# Patient Record
Sex: Male | Born: 1995 | Race: White | Hispanic: No | Marital: Single | Smoking: Never smoker
Health system: Southern US, Community
[De-identification: ages and names within clinical notes are randomized; demographics above are authoritative.]

## PROBLEM LIST (undated history)

## (undated) DIAGNOSIS — R110 Nausea: Secondary | ICD-10-CM

## (undated) DIAGNOSIS — R51 Headache: Secondary | ICD-10-CM

## (undated) DIAGNOSIS — F32A Depression, unspecified: Secondary | ICD-10-CM

## (undated) DIAGNOSIS — E300 Delayed puberty: Secondary | ICD-10-CM

## (undated) DIAGNOSIS — E039 Hypothyroidism, unspecified: Secondary | ICD-10-CM

## (undated) DIAGNOSIS — R7303 Prediabetes: Secondary | ICD-10-CM

## (undated) DIAGNOSIS — F909 Attention-deficit hyperactivity disorder, unspecified type: Secondary | ICD-10-CM

## (undated) DIAGNOSIS — F329 Major depressive disorder, single episode, unspecified: Secondary | ICD-10-CM

## (undated) DIAGNOSIS — F419 Anxiety disorder, unspecified: Secondary | ICD-10-CM

## (undated) HISTORY — DX: Nausea: R11.0

## (undated) HISTORY — DX: Attention-deficit hyperactivity disorder, unspecified type: F90.9

## (undated) HISTORY — DX: Hypothyroidism, unspecified: E03.9

## (undated) HISTORY — DX: Prediabetes: R73.03

## (undated) HISTORY — PX: PLANTAR'S WART EXCISION: SHX2240

---
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
## 2016-01-05 ENCOUNTER — Emergency Department (HOSPITAL_COMMUNITY)
Admission: EM | Admit: 2016-01-05 | Discharge: 2016-01-05 | Disposition: A | Discharge status: Home / Self Care | Payer: Medicaid Other | Attending: Emergency Medicine | Admitting: Emergency Medicine

## 2016-01-05 ENCOUNTER — Encounter (HOSPITAL_COMMUNITY): Payer: Self-pay | Admitting: Emergency Medicine

## 2016-01-05 DIAGNOSIS — R6883 Chills (without fever): Secondary | ICD-10-CM | POA: Insufficient documentation

## 2016-01-05 DIAGNOSIS — F329 Major depressive disorder, single episode, unspecified: Secondary | ICD-10-CM | POA: Insufficient documentation

## 2016-01-05 DIAGNOSIS — R112 Nausea with vomiting, unspecified: Secondary | ICD-10-CM | POA: Diagnosis not present

## 2016-01-05 DIAGNOSIS — Z79899 Other long term (current) drug therapy: Secondary | ICD-10-CM | POA: Insufficient documentation

## 2016-01-05 DIAGNOSIS — R1031 Right lower quadrant pain: Secondary | ICD-10-CM | POA: Insufficient documentation

## 2016-01-05 DIAGNOSIS — F419 Anxiety disorder, unspecified: Secondary | ICD-10-CM | POA: Diagnosis not present

## 2016-01-05 LAB — COMPREHENSIVE METABOLIC PANEL
ALBUMIN: 4.5 g/dL (ref 3.5–5.0)
ALK PHOS: 117 U/L (ref 38–126)
ALT: 17 U/L (ref 17–63)
ANION GAP: 11 (ref 5–15)
AST: 22 U/L (ref 15–41)
BILIRUBIN TOTAL: 0.6 mg/dL (ref 0.3–1.2)
BUN: 18 mg/dL (ref 6–20)
CALCIUM: 9.7 mg/dL (ref 8.9–10.3)
CO2: 25 mmol/L (ref 22–32)
Chloride: 104 mmol/L (ref 101–111)
Creatinine, Ser: 0.77 mg/dL (ref 0.61–1.24)
GFR calc Af Amer: >60 mL/min (ref >60)
GFR calc non Af Amer: >60 mL/min (ref >60)
GLUCOSE: 105 mg/dL — AB (ref 65–99)
Potassium: 4 mmol/L (ref 3.5–5.1)
SODIUM: 140 mmol/L (ref 135–145)
TOTAL PROTEIN: 7.6 g/dL (ref 6.5–8.1)

## 2016-01-05 LAB — URINALYSIS, ROUTINE W REFLEX MICROSCOPIC
Glucose, UA: NEGATIVE mg/dL
HGB URINE DIPSTICK: NEGATIVE
KETONES UR: NEGATIVE mg/dL
Leukocytes, UA: NEGATIVE
Nitrite: NEGATIVE
PH: 6.5 (ref 5.0–8.0)
Protein, ur: NEGATIVE mg/dL
SPECIFIC GRAVITY, URINE: 1.036 — AB (ref 1.005–1.030)

## 2016-01-05 LAB — CBC
HCT: 41.8 % (ref 39.0–52.0)
HEMOGLOBIN: 14.3 g/dL (ref 13.0–17.0)
MCH: 29.4 pg (ref 26.0–34.0)
MCHC: 34.2 g/dL (ref 30.0–36.0)
MCV: 85.8 fL (ref 78.0–100.0)
Platelets: 262 10*3/uL (ref 150–400)
RBC: 4.87 MIL/uL (ref 4.22–5.81)
RDW: 12.7 % (ref 11.5–15.5)
WBC: 11.2 10*3/uL — ABNORMAL HIGH (ref 4.0–10.5)

## 2016-01-05 LAB — LIPASE, BLOOD: Lipase: 21 U/L (ref 11–51)

## 2016-01-05 MED ORDER — METOCLOPRAMIDE HCL 5 MG/ML IJ SOLN
10.0000 mg | Freq: Once | INTRAMUSCULAR | Status: AC
  Administered 2016-01-05: 10 mg via INTRAVENOUS
  Filled 2016-01-05: qty 2

## 2016-01-05 MED ORDER — LORAZEPAM 2 MG/ML IJ SOLN
1.0000 mg | Freq: Once | INTRAMUSCULAR | Status: AC
  Administered 2016-01-05: 1 mg via INTRAVENOUS
  Filled 2016-01-05: qty 1

## 2016-01-05 MED ORDER — ONDANSETRON HCL 4 MG/2ML IJ SOLN
4.0000 mg | Freq: Once | INTRAMUSCULAR | Status: AC
  Administered 2016-01-05: 4 mg via INTRAVENOUS
  Filled 2016-01-05: qty 2

## 2016-01-05 MED ORDER — DIPHENHYDRAMINE HCL 50 MG/ML IJ SOLN: 25.0000 mg | Freq: Once | INTRAMUSCULAR | Status: DC

## 2016-01-05 MED ORDER — SODIUM CHLORIDE 0.9 % IV BOLUS (SEPSIS)
1000.0000 mL | Freq: Once | INTRAVENOUS | Status: AC
  Administered 2016-01-05: 1000 mL via INTRAVENOUS

## 2016-01-05 MED ORDER — SUCRALFATE 1 GM/10ML PO SUSP
1.0000 g | Freq: Three times a day (TID) | ORAL | Status: DC
  Filled 2016-01-05 (×5): qty 10

## 2016-01-05 MED ORDER — LORAZEPAM 0.5 MG PO TABS: 0.5000 mg | ORAL_TABLET | Freq: Three times a day (TID) | ORAL | Status: DC | PRN

## 2016-01-05 NOTE — ED Notes (Signed)
Pt aware that a urine sample is needed. Pt sts he hasn't been able to urinate for the past 4 hours.  But does have a urinal at bedside and will let staff know when he is finished.

## 2016-01-05 NOTE — ED Notes (Signed)
Patient presents with RLQ abdominal pain, N/V starting yesterday evening. Denies fever/chills, diarrhea.

## 2016-01-05 NOTE — Discharge Instructions (Signed)
As discussed, today's evaluation is largely reassuring.  However, with his episodes of recurrent nausea, vomiting, it is important that he follow up with our gastroenterology colleagues.  Return here for concerning changes in his condition.

## 2016-01-05 NOTE — ED Provider Notes (Signed)
CSN: [id number]     Arrival date & time 01/05/16  0024 History  By signing my name below, I, Charles Beard, attest that this documentation has been prepared under the direction and in the presence of Charles Munch, MD. Electronically Signed: Tanda Beard, ED Scribe. 01/05/2016. 1:07 AM.   No chief complaint on file.  The history is provided by the patient. No language interpreter was used.     HPI Comments: Charles Beard is a 20 y.o. male brought in by mother, with PMHx ADHD, anxiety, and depression who presents to the Emergency Department complaining of gradual onset, constant, improving, RLQ abdominal pain x 24 hours. Pt also complains of nausea, vomiting, and chills. Mom mentions that pt has had episodes of vomiting with diarrhea about once every month for a couple of months. He does not usually have abdominal pain with these episodes. Pt has been seen by his PCP for these symptoms with no definitive findings. Denies fever, diarrhea, or any other associated symptoms. No previous abdominal surgeries.   Patient has history of delayed puberty, ADHD  Past Medical History  Diagnosis Date  . ADHD (attention deficit hyperactivity disorder) 2000  . Anxiety   . Headache(784.0)   . Depression    Past Surgical History  Procedure Laterality Date  . Plantar's wart excision     Family History  Problem Relation Age of Onset  . Thyroid disease Mother    Social History  Substance Use Topics  . Smoking status: Never Smoker   . Smokeless tobacco: None  . Alcohol Use: No    Review of Systems  Constitutional: Positive for chills. Negative for fever.       Per HPI, otherwise negative  HENT:       Per HPI, otherwise negative  Respiratory:       Per HPI, otherwise negative  Cardiovascular:       Per HPI, otherwise negative  Gastrointestinal: Positive for nausea, vomiting and abdominal pain. Negative for diarrhea.  Endocrine:       Negative aside from HPI  Genitourinary:       Neg aside  from HPI   Musculoskeletal:       Per HPI, otherwise negative  Skin: Negative.   Neurological: Negative for syncope.  Psychiatric/Behavioral: The patient is nervous/anxious.    Allergies  Review of patient's allergies indicates no known allergies.  Home Medications   Prior to Admission medications   Medication Sig Start Date End Date Taking? Authorizing Provider  calcium carbonate (OS-CAL) 600 MG TABS tablet Take 600 mg by mouth 2 (two) times daily with a meal.    Historical Provider, MD  carbamazepine (TEGRETOL XR) 200 MG 12 hr tablet Take 200 mg by mouth 2 (two) times daily.    Historical Provider, MD  escitalopram (LEXAPRO) 10 MG tablet Take 1 tablet (10 mg total) by mouth daily. 06/21/13   [name], NP  guanFACINE (INTUNIV) 1 MG TB24 Take 1 mg by mouth once.     Historical Provider, MD  lansoprazole (PREVACID) 30 MG capsule Take 1 capsule (30 mg total) by mouth daily at 12 noon. Take one capsule by mouth each morning. Patient not taking: Reported on 11/18/2014 11/20/13 11/20/14  [name], MD  Melatonin 3 MG CAPS Take 10 mg by mouth at bedtime. Patient may resume home supply. 06/21/13   [name], NP  methylphenidate 54 MG PO CR tablet Take 54 mg by mouth every morning.    Historical Provider, MD  Multiple Vitamin (MULTIVITAMIN) tablet Take 1 tablet by mouth daily. Patient may resume home supply. 06/21/13   Jolene SchimkeKim B Winson, NP  Olopatadine HCl (PAZEO) 0.7 % SOLN Place 1 drop into the right eye at bedtime.    Historical Provider, MD  Omega-3 Fatty Acids (FISH OIL) 500 MG CAPS Take 500 mg by mouth daily. Patient may resume home supply. 06/21/13   Jolene SchimkeKim B Winson, NP  omeprazole (PRILOSEC) 40 MG capsule Take 1 capsule (40 mg total) by mouth daily. Patient not taking: Reported on 11/18/2014 09/23/13 09/23/14  David StallMichael J Brennan, MD  promethazine (PHENERGAN) 25 MG tablet Take 1 tablet (25 mg total) by mouth every 6 (six) hours as needed for nausea or vomiting. Patient not taking: Reported  on 11/25/2015 09/10/15   [name], [name]  ranitidine (ZANTAC) 150 MG tablet Take 1 tablet (150 mg total) by mouth 2 (two) times daily. Patient not taking: Reported on 11/25/2015 09/10/15   [name], [name]  SYNTHROID 25 MCG tablet TAKE 1 TABLET BY MOUTH EVERY DAY BEFORE BREAKFAST 10/08/15   David StallMichael J Brennan, MD   BP 132/95 mmHg  Pulse 75  Temp(Src) 98.5 F (36.9 C) (Oral)  Resp 18  SpO2 100%   Physical Exam  Constitutional: He is oriented to person, place, and time. He appears well-developed. No distress.  HENT:  Head: Normocephalic and atraumatic.  Eyes: Conjunctivae and EOM are normal.  Cardiovascular: Normal rate and regular rhythm.   Pulmonary/Chest: Effort normal. No stridor. No respiratory distress.  Abdominal: He exhibits no distension. There is no tenderness. There is no rebound.  Musculoskeletal: He exhibits no edema.  Neurological: He is alert and oriented to person, place, and time.  Skin: Skin is warm and dry.  Psychiatric: His mood appears anxious.  Nursing note and vitals reviewed.   ED Course  Procedures (including critical care time)  DIAGNOSTIC STUDIES: Oxygen Saturation is 100% on RA, normal by my interpretation.    COORDINATION OF CARE: 1:06 AM-Discussed treatment plan with pt at bedside and pt agreed to plan.   2:53 AM - Upon reevaluation, pt states he is no longer having abdominal pain but is still vomiting. Mom reports that pt has been "jerking" after taking the Reglan.   Labs Review Labs Reviewed  COMPREHENSIVE METABOLIC PANEL - Abnormal; Notable for the following:    Glucose, Bld 105 (*)    All other components within normal limits  CBC - Abnormal; Notable for the following:    WBC 11.2 (*)    All other components within normal limits  URINALYSIS, ROUTINE W REFLEX MICROSCOPIC (NOT AT [facility]) - Abnormal; Notable for the following:    Color, Urine AMBER (*)    Specific Gravity, Urine 1.036 (*)    Bilirubin Urine SMALL (*)     All other components within normal limits  LIPASE, BLOOD    I have personally reviewed and evaluated these lab results as part of my medical decision-making.  2:57 AM After the patient had minimal change in his initial nausea with Zofran, he received a dose of Reglan.  On repeat exam, the patient has akathisia. Benadryl provided. He denies any ongoing abdominal pain.   3:32 AM Patient's akesthesia resolved.  He continues to have mild emesis, c/o heartburn.  4:47 AM Patient sleeping, comfortable. I had a lengthy conversation with the patient's mother about his history, congenital issues, episodic nausea, vomiting. Patient will follow-up with primary care and gastroenterology. MDM   I personally performed the services described in this  documentation, which was scribed in my presence. The recorded information has been reviewed and is accurate.   Young male presents with nausea, vomiting. Patient did complain of abdominal pain, but here, the patient has a soft, non-peritoneal abdomen, states that his pain has resolved. Nausea also improved, though the patient had a brief episode of akathisia after being provided Reglan, as a second intervention for his nausea. Given the patient's resolution of symptoms, reassuring vitals, and mother's description of similar episodes mother is low suspicion for acute intra-abdominal processes. Patient discharged in stable condition with provision of Ativan, per mother's request, to follow-up with gastroenterology.   Charles Munch, MD 01/05/16 [phone]

## 2016-02-19 ENCOUNTER — Ambulatory Visit
Admission: RE | Admit: 2016-02-19 | Discharge: 2016-02-19 | Disposition: A | Discharge status: Home / Self Care | Payer: Medicaid Other | Source: Ambulatory Visit | Attending: "Endocrinology | Admitting: "Endocrinology

## 2016-02-24 ENCOUNTER — Ambulatory Visit (INDEPENDENT_AMBULATORY_CARE_PROVIDER_SITE_OTHER): Payer: Medicaid Other | Admitting: "Endocrinology

## 2016-02-24 ENCOUNTER — Encounter: Payer: Self-pay | Admitting: *Deleted

## 2016-02-24 ENCOUNTER — Encounter: Payer: Self-pay | Admitting: "Endocrinology

## 2016-02-24 VITALS — BP 119/84 | HR 54 | Wt 152.4 lb

## 2016-02-24 DIAGNOSIS — R6252 Short stature (child): Secondary | ICD-10-CM

## 2016-02-24 DIAGNOSIS — E300 Delayed puberty: Secondary | ICD-10-CM

## 2016-02-24 DIAGNOSIS — I10 Essential (primary) hypertension: Secondary | ICD-10-CM

## 2016-02-24 DIAGNOSIS — R7303 Prediabetes: Secondary | ICD-10-CM

## 2016-02-24 DIAGNOSIS — E663 Overweight: Secondary | ICD-10-CM

## 2016-02-24 DIAGNOSIS — E049 Nontoxic goiter, unspecified: Secondary | ICD-10-CM

## 2016-02-24 DIAGNOSIS — E063 Autoimmune thyroiditis: Secondary | ICD-10-CM

## 2016-02-24 DIAGNOSIS — L83 Acanthosis nigricans: Secondary | ICD-10-CM

## 2016-02-24 DIAGNOSIS — E038 Other specified hypothyroidism: Secondary | ICD-10-CM

## 2016-02-24 DIAGNOSIS — E669 Obesity, unspecified: Secondary | ICD-10-CM | POA: Diagnosis not present

## 2016-02-24 LAB — GLUCOSE, POCT (MANUAL RESULT ENTRY): POC GLUCOSE: 77 mg/dL (ref 70–99)

## 2016-02-24 LAB — POCT GLYCOSYLATED HEMOGLOBIN (HGB A1C): HEMOGLOBIN A1C: 5.7

## 2016-02-24 NOTE — Patient Instructions (Signed)
Follow up visit in 3 months. Please repeat lab tests 1-2 weeks prior.  

## 2016-02-24 NOTE — Progress Notes (Addendum)
Subjective:  Patient Name: Charles Beard Date of Birth: 1996/08/05  MRN: [id number]  Charles Beard  presents to the office today for follow up evaluation and management of his puberty delay, obesity, gynecomastia, and linear growth delay/short stature.   HISTORY OF PRESENT ILLNESS:   Charles Beard is a 20 y.o. Caucasian young man.   Charles Beard was accompanied by his mother.  1. Charles Beard's initial [facility] pediatric endocrine consultation was on 05/22/13 at age 20  A. [name]: Term, emergency C-section for failure to progress.  Birth weight: 7 lbs-15 oz. Healthy newborn.  B. Infancy: Healthy  C. Childhood: ADHD was diagnosed about age 89. Charles Beard was [facility] and [facility]. Stimulants and non-stimulants did not work.  He also had severe anger issues. He sometimes bit himself or hit things, but had not hurt himself seriously. He was hit in the forehead by a pitched baseball at about age 19-8. There was no loss of consciousness. His only surgery was curettage of plantar warts. He had seasonal allergies, but no medication allergies.    D. Obesity: Mom had been concerned about his weight gain for several years. Mom tried to get him to eat healthy. According to mom, dad fed him whatever he wanted. Neither dad nor Charles Beard exercised.   E. Puberty delay: He had no axillary hair, pubic hair, or genital development.  F. Pertinent family history: Strong FH of obesity in mom and  maternal relatives. FH of T2DM in maternal grandmother. No FH of delayed puberty. Hypothyroidism in mom, MGM, MGGM. Neither mom nor MGM had thyroid surgery or irradiation. Menopause at age 19 in mom, MGM, and MGGM. Mental health issues on dad's side of family. Dad had severe mood swings. Dad was an alcoholic and PGF was an alcoholic. [Addendum 11/18/14: Dad also used drugs. Mom and dad divorced when Charles Beard was age 72. Mom also had depression.]  G. On exam, his BP was elevated at 122/87. He was quite agitated during the visit,  supposedly because he was angry that mom was sharing his personal health information with yet another person. He was significantly overweight. He had a 19-20 gram goiter. His breasts were fatty with a Tanner stage 3 appearance and enlarged areolae, but no breast buds. His abdomen was enlarged. His pubic hair was Tanner stage 1. His testes were 2-3 mL in volume, very early pubertal. Lab tests showed normal CMP,TFTs, and TPO antibody levels. His FSH was 2.2, LH 0.3,  testosterone 63, and estradiol 15.4, all c/w being in early puberty.  H. I diagnosed pubertal delay, linear growth delay, early gynecomastia, overweight, dyspepsia, goiter, and acanthosis. I instructed Charles Beard and his mother on our Eat Right Diet and proper exercise techniques. I also started him on ranitidine, 150 mg, twice daily.   2. During the past three years Charles Beard has had slow progression of puberty. As he has grown older and has had adjustments in his psychiatric medications, he has also become more emotionally stable and less emotionally reactive. 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  3. Charles Beard's last [facility] visit was on 11/25/15.   A. In the interim he has been healthy, but he has had at least one visit to the ED for vomiting and diarrhea.  He is hungry all the time.  He has been walking and riding his bike every day for about 45 minutes. Mom has been watching his portion sizes. He has not had many problems with  pollen allergies, so only takes loratadine as needed.   B. He takes Tegretol, Tenex, Lexapro, and Concerta. He remains on Synthroid, 25 mcg/day.  Charles Beard continues to see his psychiatrist, Charles Beard. His anxiety and depression have been much better since starting Lexapro and changing to Tenex.    D. He had psychological testing in the past. Charles Beard was found to have "a little bit of a low IQ". ADD was also noted. An individual IEP was developed. Last year he was in some  classes with only 4 students per class.  3.  Pertinent Review of Systems:  Constitutional: He feels "good".  He usually gets hungrier after his Concerta wears off in the afternoon.  Eyes: Vision seems to be good, better if he wears his glasses. He had a new eye exam on 06/01/15 and has a new pair of eye glasses.There are no recognized eye problems. Neck: The patient has no complaints of anterior neck swelling, soreness, tenderness, pressure, discomfort, or difficulty swallowing.   Heart: Heart rate increases with exercise or other physical activity. The patient has no complaints of palpitations, irregular heart beats, chest pain, or chest pressure.   Gastrointestinal: He still has a lot of belly hunger after his stimulants wear off. Bowel movents seem normal. The patient has no other complaints of acid reflux, stomach aches or pains, diarrhea, or constipation.  Legs: Muscle mass and strength seem normal. There are no complaints of numbness, tingling, burning, or pain. No edema is noted.  Feet: There are no complaints of numbness, tingling, burning, or pain. No edema is noted. Neurologic: There are no recognized problems with muscle movement and strength, sensation, or coordination. GU: He has more pubic hair and axillary hair. He says that his genitalia have increased. He no longer seems to have frequent difficulties urinating.  PAST MEDICAL, FAMILY, AND SOCIAL HISTORY  Past Medical History  Diagnosis Date  . ADHD (attention deficit hyperactivity disorder) 2000  . Anxiety   . Headache(784.0)   . Depression     Family History  Problem Relation Age of Onset  . Thyroid disease Mother      Current outpatient prescriptions:  .  calcium carbonate (OS-CAL) 600 MG TABS tablet, Take 600 mg by mouth 2 (two) times daily with a meal. Reported on 01/05/2016, Disp: , Rfl:  .  carbamazepine (TEGRETOL XR) 200 MG 12 hr tablet, Take 200 mg by mouth 2 (two) times daily., Disp: , Rfl:  .  escitalopram  (LEXAPRO) 20 MG tablet, Take 20 mg by mouth daily., Disp: , Rfl:  .  guanFACINE (INTUNIV) 1 MG TB24, Take 1 mg by mouth daily. , Disp: , Rfl:  .  loratadine (CLARITIN) 10 MG tablet, Take 10 mg by mouth daily as needed for allergies., Disp: , Rfl:  .  LORazepam (ATIVAN) 0.5 MG tablet, Take 1 tablet (0.5 mg total) by mouth every 8 (eight) hours as needed for anxiety (nausea)., Disp: 20 tablet, Rfl: 0 .  Melatonin 3 MG CAPS, Take 10 mg by mouth at bedtime as needed (sleep). Patient may resume home supply., Disp: , Rfl:  .  methylphenidate 54 MG PO CR tablet, Take 54 mg by mouth every morning., Disp: , Rfl:  .  ondansetron (ZOFRAN-ODT) 4 MG disintegrating tablet, Take 4 mg by mouth every 8 (eight) hours as needed for nausea or vomiting., Disp: , Rfl:  .  promethazine (PHENERGAN) 25 MG tablet, Take 1 tablet (25 mg total) by mouth every 6 (six) hours as needed for  nausea or vomiting., Disp: 10 tablet, Rfl: 0 .  SYNTHROID 25 MCG tablet, TAKE 1 TABLET BY MOUTH EVERY DAY BEFORE BREAKFAST (Patient taking differently: TAKE 25 MCG BY MOUTH EVERY DAY BEFORE BREAKFAST), Disp: 30 tablet, Rfl: 5  Allergies as of 02/24/2016  . (No Known Allergies)     reports that he has never smoked. He does not have any smokeless tobacco history on file. He reports that he does not drink alcohol or use illicit drugs. Pediatric History  Patient Guardian Status  . Mother:  Demarus, Latterell   Other Topics Concern  . Not on file   Social History Narrative   Lives at home with mom and 2 cats, visits dad every other weekend, will attend [employer], will start 9th grade in the fall.    1. School and Family: As above. He is in the 10th grade this year. He has a mix of small and large classes. School did not go so well last semester. Tyson lives with mom, but has a weekend day with dad occasionally.    2. Activities: He has increased his physical activities as noted above.  3. Primary Care Provider: Dr. Cheri Rous in  [place], [facility], phone [phone] 4. Psychiatrist: Dr. Joan Flores Akintayo  REVIEW OF SYSTEMS: There are no other significant problems involving Rayyan's other body systems.   Objective:  Vital Signs: BP 119/84     HR: 54  Wt 152 lb 6.4 oz (69.128 kg)   Ht Readings from Last 3 Encounters:  11/25/15 5' 6.22" (1.682 m) (13 %*, Z = -1.13)  06/18/15 5' 5.91" (1.674 m) (11 %*, Z = -1.20)  03/12/15 5' 5.35" (1.66 m) (9 %*, Z = -1.36)   * Growth percentiles are based on CDC 2-20 Years data.   Wt Readings from Last 3 Encounters:  02/24/16 152 lb 6.4 oz (69.128 kg) (53 %*, Z = 0.06)  11/25/15 157 lb 12.8 oz (71.578 kg) (62 %*, Z = 0.32)  06/18/15 161 lb 1.6 oz (73.074 kg) (70 %*, Z = 0.51)   * Growth percentiles are based on CDC 2-20 Years data.   HC Readings from Last 3 Encounters:  No data found for [facility]   There is no height on file to calculate BSA. No height on file for this encounter. 53%ile (Z=0.06) based on CDC 2-20 Years weight-for-age data using vitals from 02/24/2016.    PHYSICAL EXAM:  Constitutional: The patient appears healthy, but still overweight. He has lost 5 pounds since last visit. His weight has decreased to the 52.55%. He is fairly alert, bright, and engaged today. His sense of humor is good. His affect is fairly normal. His insight is fair today. Today's visit went well. There was no overt emotional friction between [place]ington and mom. Mom ws more supportive and less critical. Oden was not overtly reactive. Head: The head is normocephalic. Face: The face appears normal. There are no obvious dysmorphic features or plethora. Eyes: The eyes appear to be normally formed and spaced. Gaze is conjugate. There is no obvious arcus or proptosis. Moisture appears normal. Ears: The ears are normally placed and appear externally normal. Mouth: The oropharynx and tongue appear normal. Dentition appears to be normal for age. Oral moisture is normal. There is  no hyperpigmentation. He has a grade I-II fine, blond mustache.  Neck: The neck appears to be visibly enlarged. No carotid bruits are noted. The thyroid gland is still slightly enlarged, but smaller, at about 21-22 grams in  size. The left lobe is again larger than the right. The consistency of the left lobe is again fairly firm today. The thyroid gland is non- tender. He has 1+ acanthosis.  Lungs: The lungs are clear to auscultation. Air movement is good. Heart: Heart rate and rhythm are regular. Heart sounds S1 and S2 are normal. I did not appreciate any pathologic cardiac murmurs. Abdomen: The abdomen is enlarged. Bowel sounds are normal. There is no obvious hepatomegaly, splenomegaly, or other mass effect.  Arms: Muscle size and bulk are normal for age. Hands: There is no obvious tremor. Phalangeal and metacarpophalangeal joints are normal. Palmar muscles are normal for age. Palmar skin is normal. Palmar moisture is also normal. Legs: Muscles appear normal for age. No edema is present. Neurologic: Strength is normal for age in both the upper and lower extremities. Muscle tone is normal. Sensation to touch is normal in both legs.   Psych: He was very relaxed and comfortable coming to the clinic today and engaged well. His maturity level has continued to improve. Chest: Breasts are fatty, with a Tanner stage I appearance.  The right areola measures 31 mm, compared with 25 mm at last visit.  The left areola measures 31 mm today, compared with 30 mm at last visit. I do not feel any breast buds today.   GU: Pubic hair is blond, Tanner stage I.8. His right testis is 8 mL in volume, compared with 6 mL in volume at last visit. Left testis is 6 mL in volume, compared with 5 mL at last visit.      LAB DATA:   Results for orders placed or performed in visit on 02/24/16 (from the past 504 hour(s))  POCT Glucose (CBG)   Collection Time: 02/24/16  3:29 PM  Result Value Ref Range   POC Glucose 77 70 - 99  mg/dl  POCT HgB Z6X   Collection Time: 02/24/16  3:36 PM  Result Value Ref Range   Hemoglobin A1C 5.7    02/23/16: Hba1c 5.7%  01/05/16: Urine showed small bilirubin; CMP normal, except for glucose 105 after vomiting; CBC with WBC 11.2;   11/25/15: HbA1c 5.6%  09/26/15: HbA1c 5.8%; IGF-1 110, IGFBP-3 4.0; LH 0.4, FSH 1.9, testosterone 30, free testosterone 3.6, estradiol 14.6; TSH 2.315, TPO antibody <1  06/18/15: Chromosomes 46, XY  06/09/15: HbA1c 5.8%; TSH 1.906, free T4 0.94, free T3 2.8; testosterone 24; IGF-1 110; IGFBP-3 5.0  02/27/15: HbA1c 5.7%; TSH 3.443, free T4 0.74, free T3 3.0; testosterone 29  10/28/14: Testosterone 21, IGF-1 119  08/14/14: TSH 2.835, free T4 0.97, free T3 3.6; LH 0.6, FSH 1.6, testosterone 18, estradiol < 11.8; IGF-1 108, IGFBP-3 3.8  06/18/14: testosterone 27, prolactin 3.8; TSH 3.106, free T4 0.93, free T3 3.0  11/20/13: TSH 0.5, free T4 0.99, free T3 3.4; LH 0.5, FSH 3.1, testosterone 18, estradiol < 11.8  07/15/13: CBC normal; CMP normal;   05/22/13: CMP normal; TSH 2.46, free T4 1.06, free T3 3.3, TPO antibody 11.2; FSH 2.2, LH 0.3, testosterone 63, estradiol 15.6  IMAGING:  Bone age 25/21/17: Bone age was read as 14 years at a chronologic age of 49 years and 7 months. I read the bone age as 88-6.   Bone age 36/23/14: Bone age 57 years at chronologic age 81 years, 10 months.   MRI of head 05/30/13: Normal pituitary gland and brain.   Assessment and Plan:   ASSESSMENT:  1. Puberty delay: By clinical exam and testosterone values, Loren is  slowly progressing through puberty. When he lost fat weight in the past his testosterone tended to rise. When he was more physically active his testosterone also increased.  2. Growth delay, linear: His height growth seems to be better.  3. Gynecomastia: His gynecomastia may have worsened since his last visit. He still has estrogen effect clinically.  4. Overweight: He has lost some fat weight through a  combination of eating right and exercising.   5. Dyspepsia: This problem has worsened since stopping omeprazole.   6-8 Goiter/thyroiditis/hypothyroid:   A. His thyroid gland is smaller today. The process of waxing and waning of thyroid gland size is c/w slowly evolving Hashimoto's thyroiditis.    B. His TFTs last November had improved on Synthroid.   9. Acanthosis: He has acquired acanthosis, due to hyperinsulinemia, which in turn was caused by the resistance to insulin resulting from the excessive production of cytokines by overly fat adipose cells. 10. Hypertension: His SBP is normal, but his DBP is still elevated.  He needs to continue to do more exercise and lose more fat weight.  11. Prediabetes: His HbA1c is higher again. He sometimes still takes in too many carbs for the level of physical activity that he has.    PLAN:  1. Diagnostic: Repeat HbA1c at his next visit. Repeat TFTs, IGF-1, IGFBP-3, LH, FSH, estradiol, and testosterone prior to his next visit.   2. Therapeutic: Eat Right Diet, exercise for an hour at least 5 times per week. 3. Patient education: We discussed issues and causes of puberty delay, growth delay, insulin resistance, hyperinsulinemia, dyspepsia, goiter, and autoimmune thyroid disease at length. We also discussed his improvement clinically and chemically in terms of his puberty delay. The fact that he has entered puberty is a good sign, but we will not know for quite some time how far he will progress through puberty and how much testosterone he will be capable of producing as an adult. Mom and Treyshaun were again pleased that he is making some pubertal progress on his own.   4. Follow-up: 3 months   Level of Service: This visit lasted in excess of 55 minutes. More than 50% of the visit was devoted to counseling.  [name], MD

## 2016-03-28 ENCOUNTER — Emergency Department (HOSPITAL_COMMUNITY): Payer: Medicaid Other

## 2016-03-28 ENCOUNTER — Encounter (HOSPITAL_COMMUNITY): Payer: Self-pay | Admitting: Emergency Medicine

## 2016-03-28 ENCOUNTER — Emergency Department (HOSPITAL_COMMUNITY)
Admission: EM | Admit: 2016-03-28 | Discharge: 2016-03-28 | Disposition: A | Discharge status: Home / Self Care | Payer: Medicaid Other | Attending: Emergency Medicine | Admitting: Emergency Medicine

## 2016-03-28 DIAGNOSIS — R1031 Right lower quadrant pain: Secondary | ICD-10-CM | POA: Insufficient documentation

## 2016-03-28 DIAGNOSIS — R112 Nausea with vomiting, unspecified: Secondary | ICD-10-CM | POA: Diagnosis not present

## 2016-03-28 DIAGNOSIS — Z79899 Other long term (current) drug therapy: Secondary | ICD-10-CM | POA: Insufficient documentation

## 2016-03-28 DIAGNOSIS — R1115 Cyclical vomiting syndrome unrelated to migraine: Secondary | ICD-10-CM

## 2016-03-28 HISTORY — DX: Delayed puberty: E30.0

## 2016-03-28 LAB — URINALYSIS, ROUTINE W REFLEX MICROSCOPIC
Glucose, UA: NEGATIVE mg/dL
HGB URINE DIPSTICK: NEGATIVE
KETONES UR: NEGATIVE mg/dL
Leukocytes, UA: NEGATIVE
Nitrite: NEGATIVE
PROTEIN: NEGATIVE mg/dL
Specific Gravity, Urine: 1.029 (ref 1.005–1.030)
pH: 6.5 (ref 5.0–8.0)

## 2016-03-28 LAB — CBC
HCT: 41.4 % (ref 39.0–52.0)
Hemoglobin: 14.4 g/dL (ref 13.0–17.0)
MCH: 28.7 pg (ref 26.0–34.0)
MCHC: 34.8 g/dL (ref 30.0–36.0)
MCV: 82.6 fL (ref 78.0–100.0)
PLATELETS: 262 10*3/uL (ref 150–400)
RBC: 5.01 MIL/uL (ref 4.22–5.81)
RDW: 13 % (ref 11.5–15.5)
WBC: 10.6 10*3/uL — ABNORMAL HIGH (ref 4.0–10.5)

## 2016-03-28 LAB — COMPREHENSIVE METABOLIC PANEL
ALBUMIN: 4.9 g/dL (ref 3.5–5.0)
ALK PHOS: 124 U/L (ref 38–126)
ALT: 14 U/L — AB (ref 17–63)
AST: 21 U/L (ref 15–41)
Anion gap: 11 (ref 5–15)
BUN: 11 mg/dL (ref 6–20)
CALCIUM: 9.8 mg/dL (ref 8.9–10.3)
CO2: 23 mmol/L (ref 22–32)
CREATININE: 0.86 mg/dL (ref 0.61–1.24)
Chloride: 108 mmol/L (ref 101–111)
GFR calc non Af Amer: >60 mL/min (ref >60)
GLUCOSE: 100 mg/dL — AB (ref 65–99)
Potassium: 3.5 mmol/L (ref 3.5–5.1)
SODIUM: 142 mmol/L (ref 135–145)
Total Bilirubin: 0.6 mg/dL (ref 0.3–1.2)
Total Protein: 7.6 g/dL (ref 6.5–8.1)

## 2016-03-28 LAB — RAPID URINE DRUG SCREEN, HOSP PERFORMED
Amphetamines: NOT DETECTED
Barbiturates: NOT DETECTED
Benzodiazepines: POSITIVE — AB
Cocaine: NOT DETECTED
OPIATES: NOT DETECTED
TETRAHYDROCANNABINOL: POSITIVE — AB

## 2016-03-28 LAB — LIPASE, BLOOD: Lipase: 17 U/L (ref 11–51)

## 2016-03-28 MED ORDER — IOPAMIDOL (ISOVUE-300) INJECTION 61%
100.0000 mL | Freq: Once | INTRAVENOUS | Status: AC | PRN
  Administered 2016-03-28: 100 mL via INTRAVENOUS

## 2016-03-28 MED ORDER — ONDANSETRON HCL 4 MG/2ML IJ SOLN
4.0000 mg | Freq: Once | INTRAMUSCULAR | Status: AC | PRN
  Administered 2016-03-28: 4 mg via INTRAVENOUS
  Filled 2016-03-28: qty 2

## 2016-03-28 MED ORDER — HYDROMORPHONE HCL 1 MG/ML IJ SOLN
0.5000 mg | Freq: Once | INTRAMUSCULAR | Status: AC
  Administered 2016-03-28: 0.5 mg via INTRAVENOUS
  Filled 2016-03-28: qty 1

## 2016-03-28 MED ORDER — ONDANSETRON HCL 4 MG PO TABS: 4.0000 mg | ORAL_TABLET | Freq: Four times a day (QID) | ORAL | Status: DC

## 2016-03-28 MED ORDER — SODIUM CHLORIDE 0.9 % IV BOLUS (SEPSIS)
1000.0000 mL | Freq: Once | INTRAVENOUS | Status: AC
  Administered 2016-03-28: 1000 mL via INTRAVENOUS

## 2016-03-28 MED ORDER — PROMETHAZINE HCL 25 MG RE SUPP: 25.0000 mg | Freq: Four times a day (QID) | RECTAL | Status: DC | PRN

## 2016-03-28 MED ORDER — ONDANSETRON HCL 4 MG/2ML IJ SOLN
4.0000 mg | Freq: Once | INTRAMUSCULAR | Status: AC
Start: 2016-03-28 — End: 2016-03-28
  Administered 2016-03-28: 4 mg via INTRAVENOUS
  Filled 2016-03-28: qty 2

## 2016-03-28 MED ORDER — PROCHLORPERAZINE EDISYLATE 5 MG/ML IJ SOLN
10.0000 mg | Freq: Once | INTRAMUSCULAR | Status: AC
  Administered 2016-03-28: 10 mg via INTRAVENOUS
  Filled 2016-03-28: qty 2

## 2016-03-28 NOTE — ED Notes (Signed)
Patient walked to restroom #4 then came back and stood in front of his room, looked at nurse's desk then proceeded to vomit into the floor. Looked at this scriber and states, " i just threw up on the floor." then goes back into his room. Patient then came into hallway wanting something to drink. RN Informed patient that while he is actively vomiting, he can't have anything to drink.  Patient went into room and drank water out of sink, patient's mother yelled at him to stop drinking. Then patient started actively vomited.

## 2016-03-28 NOTE — ED Notes (Signed)
Patient c/o abd cramping, n/v and occasional diarrhea when vomiting really hard for 3 days. Patient seen at [facility] but can't keep down phenergan or zofran down.

## 2016-03-28 NOTE — ED Notes (Signed)
Called CT to see how much longer before they come get him for CT, was told appro 20 mins.

## 2016-03-28 NOTE — ED Provider Notes (Signed)
CSN: [id number]     Arrival date & time 03/28/16  [id number] History   First MD Initiated Contact with Patient 03/28/16 [phone]     Chief Complaint  Patient presents with  . Nausea  . Emesis  . Abdominal Cramping  . Diarrhea   HPI Comments: 20 year old who presents with N/V for the past 4 days. PMH significant for frequent episodes of N/V/D, ADHD, GAD, puberty delay, thyroiditis. He was seen at [facility] for the same problem twice in the past couple days, diagnosed with gastroenteritis, and discharged home with antiemetics and Bentyl which his mother states were not filled. They did have left over Zofran from previous episodes which patient was not able to keep down. Mother states that the patient has had ongoing issues with N/V at least once a month for the past year and a half without a diagnosis. Reports associated generalized abdominal cramping and diarrhea which has gotten better. Denies fever, chills, CP, SOB, blood in vomit or stool, urinary symptoms.   Patient is a 20 y.o. male presenting with vomiting, cramps, and diarrhea.  Emesis Associated symptoms: abdominal pain and diarrhea   Abdominal Cramping Associated symptoms include abdominal pain, nausea and vomiting. Pertinent negatives include no chest pain or fever.  Diarrhea Associated symptoms: abdominal pain and vomiting   Associated symptoms: no fever     Past Medical History  Diagnosis Date  . ADHD (attention deficit hyperactivity disorder) 2000  . Anxiety   . Headache(784.0)   . Depression    Past Surgical History  Procedure Laterality Date  . Plantar's wart excision     Family History  Problem Relation Age of Onset  . Thyroid disease Mother    Social History  Substance Use Topics  . Smoking status: Never Smoker   . Smokeless tobacco: None  . Alcohol Use: No    Review of Systems  Constitutional: Negative for fever.  Respiratory: Negative for shortness of breath.   Cardiovascular: Negative for chest pain.   Gastrointestinal: Positive for nausea, vomiting, abdominal pain and diarrhea. Negative for blood in stool.  Genitourinary: Negative for dysuria.      Allergies  Reglan  Home Medications   Prior to Admission medications   Medication Sig Start Date End Date Taking? Authorizing Provider  calcium carbonate (OS-CAL) 600 MG TABS tablet Take 600 mg by mouth 2 (two) times daily with a meal. Reported on 01/05/2016    Historical Provider, MD  carbamazepine (TEGRETOL XR) 200 MG 12 hr tablet Take 200 mg by mouth 2 (two) times daily.    Historical Provider, MD  escitalopram (LEXAPRO) 20 MG tablet Take 20 mg by mouth daily.    Historical Provider, MD  guanFACINE (INTUNIV) 1 MG TB24 Take 1 mg by mouth daily.     Historical Provider, MD  loratadine (CLARITIN) 10 MG tablet Take 10 mg by mouth daily as needed for allergies.    Historical Provider, MD  LORazepam (ATIVAN) 0.5 MG tablet Take 1 tablet (0.5 mg total) by mouth every 8 (eight) hours as needed for anxiety (nausea). 01/05/16   Gerhard Munchobert Lockwood, MD  Melatonin 3 MG CAPS Take 10 mg by mouth at bedtime as needed (sleep). Patient may resume home supply. 06/21/13   Jolene SchimkeKim B Winson, NP  methylphenidate 54 MG PO CR tablet Take 54 mg by mouth every morning.    Historical Provider, MD  ondansetron (ZOFRAN-ODT) 4 MG disintegrating tablet Take 4 mg by mouth every 8 (eight) hours as needed for nausea or  vomiting.    Historical Provider, MD  promethazine (PHENERGAN) 25 MG tablet Take 1 tablet (25 mg total) by mouth every 6 (six) hours as needed for nausea or vomiting. 09/10/15   [name], [name]  SYNTHROID 25 MCG tablet TAKE 1 TABLET BY MOUTH EVERY DAY BEFORE BREAKFAST Patient taking differently: TAKE 25 MCG BY MOUTH EVERY DAY BEFORE BREAKFAST 10/08/15   [name], MD   BP 144/100 mmHg  Pulse 81  Temp(Src) 98.1 F (36.7 C) (Oral)  Resp 24  SpO2 100%   Physical Exam  Constitutional: He is oriented to person, place, and time. He appears  well-developed and well-nourished. He appears distressed.  Patient is actively retching in room  HENT:  Head: Normocephalic and atraumatic.  Eyes: Conjunctivae are normal. Pupils are equal, round, and reactive to light. Right eye exhibits no discharge. Left eye exhibits no discharge. No scleral icterus.  Neck: Normal range of motion.  Cardiovascular: Normal rate and regular rhythm.  Exam reveals no gallop and no friction rub.   No murmur heard. Pulmonary/Chest: Effort normal and breath sounds normal. No respiratory distress. He has no wheezes. He has no rales. He exhibits no tenderness.  Abdominal: Soft. Bowel sounds are normal. He exhibits no distension and no mass. There is tenderness. There is no rebound and no guarding.  Diffuse tenderness however patient states most of his pain is RLQ  Neurological: He is alert and oriented to person, place, and time.  Skin: Skin is warm and dry.  Psychiatric: His speech is normal. His mood appears anxious. He is hyperactive.    ED Course  Procedures (including critical care time) Labs Review Labs Reviewed  COMPREHENSIVE METABOLIC PANEL - Abnormal; Notable for the following:    Glucose, Bld 100 (*)    ALT 14 (*)    All other components within normal limits  CBC - Abnormal; Notable for the following:    WBC 10.6 (*)    All other components within normal limits  URINALYSIS, ROUTINE W REFLEX MICROSCOPIC (NOT AT [facility]) - Abnormal; Notable for the following:    Color, Urine AMBER (*)    Bilirubin Urine SMALL (*)    All other components within normal limits  URINE RAPID DRUG SCREEN, HOSP PERFORMED - Abnormal; Notable for the following:    Benzodiazepines POSITIVE (*)    Tetrahydrocannabinol POSITIVE (*)    All other components within normal limits  LIPASE, BLOOD    Imaging Review Ct Abdomen Pelvis W Contrast  03/28/2016  CLINICAL DATA:  Cramping, diarrhea, vomiting EXAM: CT ABDOMEN AND PELVIS WITH CONTRAST TECHNIQUE: Multidetector CT imaging of  the abdomen and pelvis was performed using the standard protocol following bolus administration of intravenous contrast. CONTRAST:  ISOVUE-300 IOPAMIDOL (ISOVUE-300) INJECTION 61% COMPARISON:  None. FINDINGS: Lower chest:  No acute findings. Hepatobiliary: No masses or other significant abnormality. Pancreas: No mass, inflammatory changes, or other significant abnormality. Spleen: Within normal limits in size and appearance. Adrenals/Urinary Tract: No masses identified. No evidence of hydronephrosis. Stomach/Bowel: No evidence of obstruction, inflammatory process, or abnormal fluid collections. Normal appendix. Vascular/Lymphatic: No pathologically enlarged lymph nodes. No evidence of abdominal aortic aneurysm. Reproductive: No mass or other significant abnormality. Other: None. Musculoskeletal:  No suspicious bone lesions identified. IMPRESSION: 1. No acute abdominal or pelvic pathology. Electronically Signed   By: Elige Ko   On: 03/28/2016 12:20   I have personally reviewed and evaluated these images and lab results as part of my medical decision-making.   EKG Interpretation  None      MDM   Final diagnoses:  Non-intractable cyclical vomiting with nausea   20 year old male with abdominal cramping, N/V/D for the past 4 days. He seems to be in a considerable amount of pain and is writhing in bed and retching. He is afebrile, not tachycardic or tachypneic, and not hypoxic. He is mildly hypertensive. Zofran x 2 given along with Compazine with moderate relief. IVF given. Dilaudid given for pain. CT of abdomen obtained since symptoms are ongoing which was negative. CBC shows mild leukocytosis of 10.6. CMP is unremarkable. UA is clean. UDS is positive for benzos and THC. He current takes ativan for panic attacks. Possibly this is cannabinoid hyperemesis syndrome although patient is denying use. Patient is reasonably medically screened and is asking to go home. Rx for antiemetics given and asked mom  to fill Bentyl. GI follow up recommended and marijuana cessation. Patient is NAD, non-toxic, with stable VS. Patient is informed of clinical course, understands medical decision making process, and agrees with plan. Opportunity for questions provided and all questions answered. Return precautions given.     Bethel Born, [name] 03/28/16 1414  Melene Plan, DO 03/28/16 1443

## 2016-03-28 NOTE — ED Notes (Signed)
Patient transported to CT 

## 2016-04-05 ENCOUNTER — Ambulatory Visit (INDEPENDENT_AMBULATORY_CARE_PROVIDER_SITE_OTHER): Payer: Medicaid Other | Admitting: Physician Assistant

## 2016-04-05 ENCOUNTER — Encounter: Payer: Self-pay | Admitting: Physician Assistant

## 2016-04-05 ENCOUNTER — Other Ambulatory Visit (INDEPENDENT_AMBULATORY_CARE_PROVIDER_SITE_OTHER): Payer: Medicaid Other

## 2016-04-05 VITALS — BP 106/82 | HR 72 | Ht 66.0 in | Wt 145.1 lb

## 2016-04-05 DIAGNOSIS — R112 Nausea with vomiting, unspecified: Secondary | ICD-10-CM

## 2016-04-05 LAB — CBC WITH DIFFERENTIAL/PLATELET
Basophils Absolute: 0 10*3/uL (ref 0.0–0.1)
Basophils Relative: 0.3 % (ref 0.0–3.0)
EOS ABS: 0.1 10*3/uL (ref 0.0–0.7)
Eosinophils Relative: 1 % (ref 0.0–5.0)
HCT: 43.2 % (ref 36.0–49.0)
HEMOGLOBIN: 14.7 g/dL (ref 12.0–16.0)
LYMPHS ABS: 2.1 10*3/uL (ref 0.7–4.0)
Lymphocytes Relative: 29.8 % (ref 24.0–48.0)
MCHC: 34 g/dL (ref 31.0–37.0)
MCV: 84.3 fl (ref 78.0–98.0)
MONO ABS: 0.5 10*3/uL (ref 0.1–1.0)
Monocytes Relative: 7 % (ref 3.0–12.0)
NEUTROS PCT: 61.9 % (ref 43.0–71.0)
Neutro Abs: 4.5 10*3/uL (ref 1.4–7.7)
Platelets: 249 10*3/uL (ref 150.0–575.0)
RBC: 5.13 Mil/uL (ref 3.80–5.70)
RDW: 13.3 % (ref 11.4–15.5)
WBC: 7.2 10*3/uL (ref 4.5–13.5)

## 2016-04-05 LAB — SEDIMENTATION RATE: SED RATE: 4 mm/h (ref 0–15)

## 2016-04-05 LAB — HIGH SENSITIVITY CRP: CRP HIGH SENSITIVITY: 5.67 mg/L — AB (ref 0.000–5.000)

## 2016-04-05 MED ORDER — PROMETHAZINE HCL 25 MG PO TABS: 25.0000 mg | ORAL_TABLET | Freq: Three times a day (TID) | ORAL | Status: DC | PRN

## 2016-04-05 MED ORDER — OMEPRAZOLE 40 MG PO CPDR
40.0000 mg | DELAYED_RELEASE_CAPSULE | Freq: Every day | ORAL | Status: DC
Start: 2016-04-05 — End: 2016-05-23

## 2016-04-05 NOTE — Patient Instructions (Addendum)
Please go to the basement level to have your labs drawn.   We [facility] prescriptions [facility] [address]  1. Prilosec 40 mg  2. Phenergan 24 mg tablets.  You have been scheduled for an Upper GI Series and Gastric Emtpying Scan . Your appointment is on 04-11-2016 Monday . Please arrive at 6:45 am to your test for registration. Make certain not to have anything to eat or drink after midnight on the night before your test. If you need to reschedule, please contact radiology at [phone]. --------------------------------------------------------------------------------------------------------------- An upper GI series uses x rays to help diagnose problems of the upper GI tract, which includes the esophagus, stomach, and duodenum. The duodenum is the first part of the small intestine. An upper GI series is conducted by a radiology technologist or a radiologist-a doctor who specializes in x-ray imaging-at a hospital or outpatient center. While sitting or standing in front of an x-ray machine, the patient drinks barium liquid, which is often white and has a chalky consistency and taste. The barium liquid coats the lining of the upper GI tract and makes signs of disease show up more clearly on x rays. X-ray video, called fluoroscopy, is used to view the barium liquid moving through the esophagus, stomach, and duodenum. Additional x rays and fluoroscopy are performed while the patient lies on an x-ray table. To fully coat the upper GI tract with barium liquid, the technologist or radiologist may press on the abdomen or ask the patient to change position. Patients hold still in various positions, allowing the technologist or radiologist to take x rays of the upper GI tract at different angles. If a technologist conducts the upper GI series, a radiologist will later examine the images to look for problems.  This test typically takes about 1 hour to  complete ---------------------------------------------------------------------------------------------------------------------------------------------

## 2016-04-05 NOTE — Progress Notes (Signed)
Patient ID: Charles Beard, male   DOB: Feb 06, 1996, 20 y.o.   MRN: [id number]   Subjective:    Patient ID: Charles Beard, male    DOB: [birth year], 20 y.o.   MRN: [id number]  HPI  Charles Beard Is an 20 year old white male, new to GI today referred by Dr. Egbert Garibaldi DO / [facility] for evaluation of recurrent episodes of intractable nausea and vomiting. Patient has fairly extensive past medical history primarily revolving around diagnosis of ADHD, delayed puberty, and significant anxiety and depression Reviewed records also states she has a low IQ. He is on multiple psychotropic medications including Tegretol, Lexapro, Intuniv, Ativan, Concerta. She is accompanied by his mother today who gives most of his history. She states that he has had multiple ER visits over the past 2-3 years with repeated prolonged episodes of vomiting. He apparently been seen multiple times at [facility]. He says these episodes do not seem to be triggered by any specific foods or by stress that she is aware of. She says these at times will last for several days. He has just had another episode in this one lasted longer than any prior episodes now on the eighth day. Patient says he will not have symptoms every day and does have good days where he feels fine. Occasionally will have diarrhea associated with these episodes. He denies any real abdominal pain but says after he has vomited several times he will have some epigastric discomfort. He has been given Phenergan in the past as well as Bentyl neither of which have been helpful. He did have a tardive dyskinesia with metoclopramide per old records. Patient says that Phenergan is helpful. He shouldn't is unable to tell me whether he feels that anxiety contributes to his vomiting. His mother says he is no longer going to school because he was bullied so much she is enrolled him in an online program. He does have a neuropsychiatrist that he sees Dr. Rachel Beard. Patient had recent ER  visit on 03/28/2016 at [facility] [place]. Labs were unremarkable including hepatic panel. Interestingly drug screen was positive for THC. CT of the abdomen and pelvis was unremarkable.  Review of Systems Pertinent positive and negative review of systems were noted in the above HPI section.  All other review of systems was otherwise negative.  Outpatient Encounter Prescriptions as of 04/05/2016  Medication Sig  . SYNTHROID 25 MCG tablet TAKE 1 TABLET BY MOUTH EVERY DAY BEFORE BREAKFAST (Patient taking differently: TAKE 25 MCG BY MOUTH EVERY DAY BEFORE BREAKFAST)  . calcium carbonate (OS-CAL) 600 MG TABS tablet Take 600 mg by mouth 2 (two) times daily with a meal. Reported on 04/05/2016  . carbamazepine (TEGRETOL XR) 200 MG 12 hr tablet Take 200 mg by mouth 2 (two) times daily. Reported on 04/05/2016  . escitalopram (LEXAPRO) 20 MG tablet Take 20 mg by mouth daily. Reported on 04/05/2016  . guanFACINE (INTUNIV) 1 MG TB24 Take 1 mg by mouth daily. Reported on 04/05/2016  . loratadine (CLARITIN) 10 MG tablet Take 10 mg by mouth daily as needed for allergies. Reported on 04/05/2016  . LORazepam (ATIVAN) 0.5 MG tablet Take 1 tablet (0.5 mg total) by mouth every 8 (eight) hours as needed for anxiety (nausea). (Patient not taking: Reported on 04/05/2016)  . Melatonin 3 MG CAPS Take 10 mg by mouth at bedtime as needed (sleep). Reported on 04/05/2016  . methylphenidate 27 MG PO CR tablet Take 27 mg by mouth every morning. Reported on 04/05/2016  .  omeprazole (PRILOSEC) 40 MG capsule Take 1 capsule (40 mg total) by mouth daily.  . ondansetron (ZOFRAN-ODT) 4 MG disintegrating tablet Take 4 mg by mouth every 8 (eight) hours as needed for nausea or vomiting. Reported on 04/05/2016  . promethazine (PHENERGAN) 25 MG suppository Place 1 suppository (25 mg total) rectally every 6 (six) hours as needed for nausea or vomiting. (Patient not taking: Reported on 04/05/2016)  . promethazine (PHENERGAN) 25 MG tablet Take 1 tablet (25 mg  total) by mouth every 8 (eight) hours as needed for nausea or vomiting.  . [DISCONTINUED] methylphenidate 54 MG PO CR tablet Take 54 mg by mouth every morning.  . [DISCONTINUED] ondansetron (ZOFRAN) 4 MG tablet Take 1 tablet (4 mg total) by mouth every 6 (six) hours.  . [DISCONTINUED] promethazine (PHENERGAN) 25 MG tablet Take 1 tablet (25 mg total) by mouth every 6 (six) hours as needed for nausea or vomiting. (Patient not taking: Reported on 04/05/2016)   No facility-administered encounter medications on file as of 04/05/2016.   Allergies  Allergen Reactions  . Reglan [Metoclopramide] Other (See Comments)    Seizure like activity   Patient Active Problem List   Diagnosis Date Noted  . Elevated hemoglobin A1c 03/12/2015  . Essential hypertension, benign 03/12/2015  . 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 Dyspepsia 11/21/2013  . 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 MDD (major depressive disorder), single episode, severe , no psychosis (HCC) 06/15/2013  . Suicide (HCC) 06/15/2013  . ADHD (attention deficit hyperactivity disorder), combined type 06/15/2013  . GAD (generalized anxiety disorder) 06/15/2013  . Puberty delay 05/23/2013  . Delayed linear growth 05/23/2013  . Overweight peds (BMI 85-94.9 percentile) 05/23/2013  . Goiter 05/23/2013  . Acanthosis nigricans, acquired 05/23/2013  . Gynecomastia, male 05/23/2013   Social History   Social History  . Marital Status: Single    Spouse Name: N/A  . Number of Children: 0  . Years of Education: N/A   Occupational History  . Not on file.   Social History Main Topics  . Smoking status: Never Smoker   . Smokeless tobacco: Never Used  . Alcohol Use: No  . Drug Use: No  . Sexual Activity: No     Comment: father smokes   Other Topics Concern  . Not on file   Social History Narrative   Lives at home with mom and 2 cats, visits dad every other weekend, will attend [employer], will start 9th grade in the  fall.    Mr. Charles Beard family history includes Breast cancer in his other; Diabetes in his maternal grandmother; Heart disease in his maternal grandmother; Thyroid disease in his mother.      Objective:    Filed Vitals:   04/05/16 0828  BP: 106/82  Pulse: 72    Physical Exam  well-developed very young appearing 20 year old white male curled up on the examining table, mom stroking his hair, he is tearful blood pressure 106/82 pulse 72. HEENT ;nontraumatic EOMI PERRLA sclera anicteric, Cardiovascular ;regular rate and rhythm with S1-S2 no murmur rub or gallop, Pulmonary; clear bilaterally, Abdomen; soft, bowel sounds are present there is no palpable mass or hepatosplenomegaly has some minimal tenderness in his epigastrium up rectal exam not done, Ext;no clubbing cyanosis or edema skin warm and dry, Neuropsych ;alert, cooperative     Assessment & Plan:   #1 20 yo WM With 2-3 year history of recurrent along the episodes of nausea and vomiting at times lasting for days with numerous ER visits  at outside facilities and no definite diagnosis. He may have a cyclic vomiting syndrome, rule out psychogenic. Doubt underlying GI pathology but cannot rule out Recent drug screen also positive for THC raising possibility of cannibis abuse causing recurrent nausea and vomiting Rule out severe GERD, rule out peptic ulcer disease, rule out gastroparesis #2 significant  history of severe anxiety and depression, on multiple psychotropics #3 delayed puberty #4 borderline IQ #5 ADHD #6 hypothyroid  Plan;  Phenergan has been the only antirheumatic he finds helpful- will refill Phenergan 12.5-25 mg every 6-8 hours as needed for nausea and advised his mom give him one tablet each morning at least in the short-term until his issues  Further sorted out Start Prilosec 40 mg by mouth every morning Discussed upper endoscopy at which time patient started crying and stated he was afraid she would not wake up from  sedation. We will therefore start with upper GI and this will be scheduled Schedule for gastric emptying scan I discussed marijuana use with the patient and his mother strongly advise he avoid this given his multiple health issues and repeated vomiting Patient will be established with Dr. Adela LankArmbruster.     Amy S Esterwood [name] 04/05/2016   Cc: No ref. provider found

## 2016-04-06 ENCOUNTER — Encounter: Payer: Self-pay | Admitting: Physician Assistant

## 2016-04-06 LAB — CELIAC PANEL 10
ENDOMYSIAL SCREEN: NEGATIVE
Gliadin IgA: 4 Units (ref <20)
Gliadin IgG: 4 Units (ref <20)
IGA: 184 mg/dL (ref 81–463)
Tissue Transglut Ab: 1 U/mL (ref <6)
Tissue Transglutaminase Ab, IgA: 1 U/mL (ref <4)

## 2016-04-06 NOTE — Progress Notes (Signed)
Agree with assessment and plan as outlined.  

## 2016-04-11 ENCOUNTER — Ambulatory Visit (HOSPITAL_COMMUNITY): Admission: RE | Admit: 2016-04-11 | Payer: Medicaid Other | Source: Ambulatory Visit

## 2016-04-11 ENCOUNTER — Ambulatory Visit (HOSPITAL_COMMUNITY)
Admission: RE | Admit: 2016-04-11 | Discharge: 2016-04-11 | Disposition: A | Discharge status: Home / Self Care | Payer: Medicaid Other | Source: Ambulatory Visit | Attending: Physician Assistant | Admitting: Physician Assistant

## 2016-04-11 DIAGNOSIS — R112 Nausea with vomiting, unspecified: Secondary | ICD-10-CM | POA: Insufficient documentation

## 2016-04-11 MED ORDER — TECHNETIUM TC 99M SULFUR COLLOID
2.1000 | Freq: Once | INTRAVENOUS | Status: AC | PRN
  Administered 2016-04-11: 2.1 via ORAL

## 2016-04-12 ENCOUNTER — Ambulatory Visit (HOSPITAL_COMMUNITY)
Admission: RE | Admit: 2016-04-12 | Discharge: 2016-04-12 | Disposition: A | Discharge status: Home / Self Care | Payer: Medicaid Other | Source: Ambulatory Visit | Attending: Physician Assistant | Admitting: Physician Assistant

## 2016-04-12 DIAGNOSIS — R112 Nausea with vomiting, unspecified: Secondary | ICD-10-CM | POA: Diagnosis present

## 2016-04-12 DIAGNOSIS — K219 Gastro-esophageal reflux disease without esophagitis: Secondary | ICD-10-CM | POA: Diagnosis not present

## 2016-05-02 ENCOUNTER — Other Ambulatory Visit: Payer: Self-pay | Admitting: "Endocrinology

## 2016-05-08 ENCOUNTER — Emergency Department (HOSPITAL_COMMUNITY)
Admission: EM | Admit: 2016-05-08 | Discharge: 2016-05-08 | Disposition: A | Discharge status: Home / Self Care | Payer: Medicaid Other | Attending: Emergency Medicine | Admitting: Emergency Medicine

## 2016-05-08 ENCOUNTER — Encounter (HOSPITAL_COMMUNITY): Payer: Self-pay | Admitting: *Deleted

## 2016-05-08 DIAGNOSIS — F329 Major depressive disorder, single episode, unspecified: Secondary | ICD-10-CM | POA: Diagnosis not present

## 2016-05-08 DIAGNOSIS — E039 Hypothyroidism, unspecified: Secondary | ICD-10-CM | POA: Insufficient documentation

## 2016-05-08 DIAGNOSIS — Z79899 Other long term (current) drug therapy: Secondary | ICD-10-CM | POA: Insufficient documentation

## 2016-05-08 DIAGNOSIS — G43A1 Cyclical vomiting, intractable: Secondary | ICD-10-CM | POA: Insufficient documentation

## 2016-05-08 DIAGNOSIS — R1115 Cyclical vomiting syndrome unrelated to migraine: Secondary | ICD-10-CM

## 2016-05-08 DIAGNOSIS — R197 Diarrhea, unspecified: Secondary | ICD-10-CM | POA: Diagnosis present

## 2016-05-08 LAB — CBC
HCT: 40.6 % (ref 39.0–52.0)
Hemoglobin: 14.1 g/dL (ref 13.0–17.0)
MCH: 29.3 pg (ref 26.0–34.0)
MCHC: 34.7 g/dL (ref 30.0–36.0)
MCV: 84.4 fL (ref 78.0–100.0)
PLATELETS: 265 10*3/uL (ref 150–400)
RBC: 4.81 MIL/uL (ref 4.22–5.81)
RDW: 13.1 % (ref 11.5–15.5)
WBC: 8.2 10*3/uL (ref 4.0–10.5)

## 2016-05-08 LAB — BASIC METABOLIC PANEL
Anion gap: 10 (ref 5–15)
BUN: 18 mg/dL (ref 6–20)
CALCIUM: 9.8 mg/dL (ref 8.9–10.3)
CHLORIDE: 104 mmol/L (ref 101–111)
CO2: 26 mmol/L (ref 22–32)
CREATININE: 0.94 mg/dL (ref 0.61–1.24)
GFR calc non Af Amer: >60 mL/min (ref >60)
Glucose, Bld: 108 mg/dL — ABNORMAL HIGH (ref 65–99)
Potassium: 3.7 mmol/L (ref 3.5–5.1)
SODIUM: 140 mmol/L (ref 135–145)

## 2016-05-08 MED ORDER — HYDROMORPHONE HCL 1 MG/ML IJ SOLN
1.0000 mg | Freq: Once | INTRAMUSCULAR | Status: AC
  Administered 2016-05-08: 1 mg via INTRAVENOUS
  Filled 2016-05-08: qty 1

## 2016-05-08 MED ORDER — ONDANSETRON HCL 4 MG/2ML IJ SOLN
4.0000 mg | Freq: Once | INTRAMUSCULAR | Status: AC
  Administered 2016-05-08: 4 mg via INTRAVENOUS
  Filled 2016-05-08: qty 2

## 2016-05-08 MED ORDER — PROMETHAZINE HCL 25 MG/ML IJ SOLN
12.5000 mg | Freq: Once | INTRAMUSCULAR | Status: AC
  Administered 2016-05-08: 12.5 mg via INTRAVENOUS
  Filled 2016-05-08: qty 1

## 2016-05-08 MED ORDER — ONDANSETRON 4 MG PO TBDP: 4.0000 mg | ORAL_TABLET | Freq: Three times a day (TID) | ORAL | Status: DC | PRN

## 2016-05-08 MED ORDER — SODIUM CHLORIDE 0.9 % IV SOLN: INTRAVENOUS | Status: DC

## 2016-05-08 MED ORDER — SODIUM CHLORIDE 0.9 % IV BOLUS (SEPSIS)
1000.0000 mL | Freq: Once | INTRAVENOUS | Status: AC
  Administered 2016-05-08: 1000 mL via INTRAVENOUS

## 2016-05-08 MED ORDER — OMEPRAZOLE 40 MG PO CPDR: 40.0000 mg | DELAYED_RELEASE_CAPSULE | Freq: Every day | ORAL | Status: DC

## 2016-05-08 NOTE — ED Provider Notes (Signed)
CSN: [id number]     Arrival date & time 05/08/16  0841 History   First MD Initiated Contact with Patient 05/08/16 [phone]     Chief Complaint  Patient presents with  . Emesis  . Diarrhea     (Consider location/radiation/quality/duration/timing/severity/associated sxs/prior Treatment) Patient is a 20 y.o. male presenting with vomiting and diarrhea. The history is provided by the patient.  Emesis Associated symptoms: diarrhea   Associated symptoms: no headaches and no myalgias   Diarrhea Associated symptoms: vomiting   Associated symptoms: no fever, no headaches and no myalgias   Patient followed up by primary care and GI medicine for cyclical vomiting syndrome. Workup in process. Patient's had gastric emptying studies which have been normal. Apparently celiac disease is been ruled out. Patient ran out of his normal medicines to help prevent this a couple days ago the Phenergan the Prilosec and Zofran. Patient states he's had vomiting problems for the past 4 days. Vomited several times today. No other specific problems no vomiting of blood.  Past Medical History  Diagnosis Date  . ADHD (attention deficit hyperactivity disorder) 2000  . Anxiety   . Headache(784.0)   . Depression   . Puberty delay   . Hypothyroidism   . Prediabetes    Past Surgical History  Procedure Laterality Date  . Plantar's wart excision     Family History  Problem Relation Age of Onset  . Thyroid disease Mother   . Breast cancer Other     and MGGM  . Diabetes Maternal Grandmother     and MGGM  . Heart disease Maternal Grandmother     and MGGM   Social History  Substance Use Topics  . Smoking status: Never Smoker   . Smokeless tobacco: Never Used  . Alcohol Use: No    Review of Systems  Constitutional: Negative for fever.  HENT: Negative for congestion.   Eyes: Negative for visual disturbance.  Respiratory: Negative for shortness of breath.   Cardiovascular: Negative for chest pain.   Gastrointestinal: Positive for nausea, vomiting and diarrhea.  Musculoskeletal: Negative for myalgias.  Skin: Negative for rash.  Neurological: Negative for headaches.  Hematological: Does not bruise/bleed easily.  Psychiatric/Behavioral: Negative for confusion.      Allergies  Reglan  Home Medications   Prior to Admission medications   Medication Sig Start Date End Date Taking? Authorizing Provider  calcium carbonate (OS-CAL) 600 MG TABS tablet Take 600 mg by mouth 2 (two) times daily with a meal. Reported on 04/05/2016   Yes Historical Provider, MD  carbamazepine (TEGRETOL XR) 200 MG 12 hr tablet Take 200 mg by mouth 2 (two) times daily. Reported on 04/05/2016   Yes Historical Provider, MD  escitalopram (LEXAPRO) 20 MG tablet Take 20 mg by mouth daily. Reported on 04/05/2016   Yes Historical Provider, MD  guanFACINE (INTUNIV) 1 MG TB24 Take 1 mg by mouth daily. Reported on 04/05/2016   Yes Historical Provider, MD  loratadine (CLARITIN) 10 MG tablet Take 10 mg by mouth daily as needed for allergies. Reported on 04/05/2016   Yes Historical Provider, MD  Melatonin 10 MG TABS Take 10 mg by mouth at bedtime.   Yes Historical Provider, MD  methylphenidate 27 MG PO CR tablet Take 27 mg by mouth every morning. Reported on 04/05/2016   Yes Historical Provider, MD  Multiple Vitamin (MULTIVITAMIN WITH MINERALS) TABS tablet Take 1 tablet by mouth daily.   Yes Historical Provider, MD  Omega-3 Fatty Acids (FISH OIL) 1000 MG CAPS  Take 1,000 mg by mouth daily.   Yes Historical Provider, MD  omeprazole (PRILOSEC) 40 MG capsule Take 1 capsule (40 mg total) by mouth daily. 04/05/16  Yes Amy S Esterwood, [name]  ondansetron (ZOFRAN-ODT) 4 MG disintegrating tablet Take 4 mg by mouth every 8 (eight) hours as needed for nausea or vomiting. Reported on 04/05/2016   Yes Historical Provider, MD  promethazine (PHENERGAN) 25 MG tablet Take 1 tablet (25 mg total) by mouth every 8 (eight) hours as needed for nausea or vomiting.  04/05/16  Yes Amy S Esterwood, [name]  SYNTHROID 25 MCG tablet TAKE 1 TABLET BY MOUTH EVERY DAY BEFORE BREAKFAST 05/02/16  Yes [name], MD  LORazepam (ATIVAN) 0.5 MG tablet Take 1 tablet (0.5 mg total) by mouth every 8 (eight) hours as needed for anxiety (nausea). Patient not taking: Reported on 04/05/2016 01/05/16   [name], MD  omeprazole (PRILOSEC) 40 MG capsule Take 1 capsule (40 mg total) by mouth daily. 05/08/16   Vanetta Mulders, MD  ondansetron (ZOFRAN ODT) 4 MG disintegrating tablet Take 1 tablet (4 mg total) by mouth every 8 (eight) hours as needed for nausea or vomiting. 05/08/16   Vanetta Mulders, MD  promethazine (PHENERGAN) 25 MG suppository Place 1 suppository (25 mg total) rectally every 6 (six) hours as needed for nausea or vomiting. Patient not taking: Reported on 04/05/2016 03/28/16   Bethel Born, [name]   BP 122/86 mmHg  Pulse 47  Temp(Src) 97.7 F (36.5 C) (Oral)  Resp 14  Ht 5\' 7"  (1.702 m)  Wt 65.772 kg  BMI 22.71 kg/m2  SpO2 100% Physical Exam  Constitutional: He is oriented to person, place, and time. He appears well-developed and well-nourished. No distress.  HENT:  Head: Normocephalic and atraumatic.  Mouth/Throat: Oropharynx is clear and moist.  Eyes: Conjunctivae and EOM are normal. Pupils are equal, round, and reactive to light.  Neck: Normal range of motion. Neck supple.  Cardiovascular: Normal rate, regular rhythm and normal heart sounds.   No murmur heard. Pulmonary/Chest: Effort normal and breath sounds normal. No respiratory distress.  Abdominal: Soft. Bowel sounds are normal. There is no tenderness.  Musculoskeletal: Normal range of motion. He exhibits no edema.  Neurological: He is alert and oriented to person, place, and time. No cranial nerve deficit. He exhibits normal muscle tone. Coordination normal.  Skin: Skin is warm. No rash noted.  Nursing note and vitals reviewed.   ED Course  Procedures (including critical care time) Labs  Review Labs Reviewed  BASIC METABOLIC PANEL - Abnormal; Notable for the following:    Glucose, Bld 108 (*)    All other components within normal limits  CBC   Results for orders placed or performed during the hospital encounter of 05/08/16  Basic metabolic panel  Result Value Ref Range   Sodium 140 135 - 145 mmol/L   Potassium 3.7 3.5 - 5.1 mmol/L   Chloride 104 101 - 111 mmol/L   CO2 26 22 - 32 mmol/L   Glucose, Bld 108 (H) 65 - 99 mg/dL   BUN 18 6 - 20 mg/dL   Creatinine, Ser 4.09 0.61 - 1.24 mg/dL   Calcium 9.8 8.9 - 81.1 mg/dL   GFR calc non Af Amer >60 >60 mL/min   GFR calc Af Amer >60 >60 mL/min   Anion gap 10 5 - 15  CBC  Result Value Ref Range   WBC 8.2 4.0 - 10.5 K/uL   RBC 4.81 4.22 - 5.81 MIL/uL  Hemoglobin 14.1 13.0 - 17.0 g/dL   HCT 91.4 78.2 - 95.6 %   MCV 84.4 78.0 - 100.0 fL   MCH 29.3 26.0 - 34.0 pg   MCHC 34.7 30.0 - 36.0 g/dL   RDW 21.3 08.6 - 57.8 %   Platelets 265 150 - 400 K/uL     Imaging Review No results found. I have personally reviewed and evaluated these images and lab results as part of my medical decision-making.   EKG Interpretation None      MDM   Final diagnoses:  Intractable cyclical vomiting with nausea    Patient with a history of cyclical vomiting. Patient is under workup by GI medicine gastric emptying studies have been normal celiac disease has been ruled out. Patient with an episodic problem for the past 4 days. Ran out of his normal medicines he takes to help control this is Phenergan his Prilosec and Zofran ODT. Patient here responded well to Reglan single dose of hydromorphone and IV fluids. Electrolytes showed no evidence of any significant dehydration or electrolyte abnormalities. Patient feels much better. Patient's medications will be renewed and he'll follow-up with his regular doctor and his GI doctor.  Patient's past visits reviewed.  Vanetta Mulders, MD 05/08/16 1149

## 2016-05-08 NOTE — Discharge Instructions (Signed)
Cyclic Vomiting Syndrome Cyclic vomiting syndrome is a benign condition in which patients experience bouts or cycles of severe nausea and vomiting that last for hours or even days. The bouts of nausea and vomiting alternate with longer periods of no symptoms and generally good health. Cyclic vomiting syndrome occurs mostly in children, but can affect adults. CAUSES  [facility] has no known cause. Each episode is typically similar to the previous ones. The episodes tend to:   Start at about the same time of day.  Last the same length of time.  Present the same symptoms at the same level of intensity. Cyclic vomiting syndrome can begin at any age in children and adults. Cyclic vomiting syndrome usually starts between the ages of 3 and 7 years. In adults, episodes tend to occur less often than they do in children, but they last longer. Furthermore, the events or situations that trigger episodes in adults cannot always be pinpointed as easily as they can in children. There are 4 phases of cyclic vomiting syndrome: 1. Prodrome. The prodrome phase signals that an episode of nausea and vomiting is about to begin. This phase can last from just a few minutes to several hours. This phase is often marked by belly (abdominal) pain. Sometimes taking medicine early in the prodrome phase can stop an episode in progress. However, sometimes there is no warning. A person may simply wake up in the middle of the night or early morning and begin vomiting. 2. Episode. The episode phase consists of:  Severe vomiting.  Nausea.  Gagging (retching). 3. Recovery. The recovery phase begins when the nausea and vomiting stop. Healthy color, appetite, and energy return. 4. Symptom-free interval. The symptom-free interval phase is the period between episodes when no symptoms are present. TRIGGERS Episodes can be triggered by an infection or event. Examples of triggers include:  Infections.  Colds, allergies, sinus problems, and  the flu.  Eating certain foods such as chocolate or cheese.  Foods with monosodium glutamate (MSG) or preservatives.  Fast foods.  Pre-packaged foods.  Foods with low nutritional value (junk foods).  Overeating.  Eating just before going to bed.  Hot weather.  Dehydration.  Not enough sleep or poor sleep quality.  Physical exhaustion.  Menstruation.  Motion sickness.  Emotional stress (school or home difficulties).  Excitement or stress. SYMPTOMS  The main symptoms of cyclic vomiting syndrome are:  Severe vomiting.  Nausea.  Gagging (retching). Episodes usually begin at night or the first thing in the morning. Episodes may include vomiting or retching up to 5 or 6 times an hour during the worst of the episode. Episodes usually last anywhere from 1 to 4 days. Episodes can last for up to 10 days. Other symptoms include:  Paleness.  Exhaustion.  Listlessness.  Abdominal pain.  Loose stools or diarrhea. Sometimes the nausea and vomiting are so severe that a person appears to be almost unconscious. Sensitivity to light, headache, fever, dizziness, may also accompany an episode. In addition, the vomiting may cause drooling and excessive thirst. Drinking water usually leads to more vomiting, though the water can dilute the acid in the vomit, making the episode a little less painful. Continuous vomiting can lead to dehydration, which means that the body has lost excessive water and salts. DIAGNOSIS  Cyclic vomiting syndrome is hard to diagnose because there are no clear tests to identify it. A caregiver must diagnose cyclic vomiting syndrome by looking at symptoms and medical history. A caregiver must exclude more common diseases  or disorders that can also cause nausea and vomiting. Also, diagnosis takes time because caregivers need to identify a pattern or cycle to the vomiting. TREATMENT  Cyclic vomiting syndrome cannot be cured. Treatment varies, but people with  cyclic vomiting syndrome should get plenty of rest and sleep and take medications that prevent, stop, or lessen the vomiting episodes and other symptoms. People whose episodes are frequent and long-lasting may be treated during the symptom-free intervals in an effort to prevent or ease future episodes. The symptom-free phase is a good time to eliminate anything known to trigger an episode. For example, if episodes are brought on by stress or excitement, this period is the time to find ways to reduce stress and stay calm. If sinus problems or allergies cause episodes, those conditions should be treated. The triggers listed above should be avoided or prevented. Because of the similarities between migraine and cyclic vomiting syndrome, caregivers treat some people with severe cyclic vomiting syndrome with drugs that are also used for migraine headaches. The drugs are designed to:  Prevent episodes.  Reduce their frequency.  Lessen their severity. HOME CARE INSTRUCTIONS Once a vomiting episode begins, treatment is supportive. It helps to stay in bed and sleep in a dark, quiet room. Severe nausea and vomiting may require hospitalization and intravenous (IV) fluids to prevent dehydration. Relaxing medications (sedatives) may help if the nausea continues. Sometimes, during the prodrome phase, it is possible to stop an episode from happening altogether. Only take over-the-counter or prescription medicines for pain, discomfort or fever as directed by your caregiver. Do not give aspirin to children. During the recovery phase, drinking water and replacing lost electrolytes (salts in the blood) are very important. Electrolytes are salts that the body needs to function well and stay healthy. Symptoms during the recovery phase can vary. Some people find that their appetites return to normal immediately, while others need to begin by drinking clear liquids and then move slowly to solid food. RELATED COMPLICATIONS The  severe vomiting that defines cyclic vomiting syndrome is a risk factor for several complications:  Dehydration--Vomiting causes the body to lose water quickly.  Electrolyte imbalance--Vomiting also causes the body to lose the important salts it needs to keep working properly.  Peptic esophagitis--The tube that connects the mouth to the stomach (esophagus) becomes injured from the stomach acid that comes up with the vomit.  Hematemesis--The esophagus becomes irritated and bleeds, so blood mixes with the vomit.  Mallory-Weiss tear--The lower end of the esophagus may tear open or the stomach may bruise from vomiting or retching.  Tooth decay--The acid in the vomit can hurt the teeth by corroding the tooth enamel. SEEK MEDICAL CARE IF: You have questions or problems.   This information is not intended to replace advice given to you by your health care provider. Make sure you discuss any questions you have with your health care provider.    Continue his current medications. Have renewed the Prilosec and the Zofran. Definitely continue the Phenergan as well. Make appointment to follow-up with his regular doctor. And his GI doctor.   Document Released: 12/26/2001 Document Revised: 01/09/2012 Document Reviewed: 01/24/2011 Elsevier Interactive Patient Education [employer]

## 2016-05-08 NOTE — ED Notes (Signed)
Per family report: pt has been vomiting x 4 days.  Pt has been given anti-nausea medication but pt has run out.  Pt dry heaving in triage. Pt has had diarrhea as well but not today.

## 2016-05-08 NOTE — ED Notes (Signed)
PT DISCHARGED. INSTRUCTIONS AND PRESCRIPTIONS GIVEN. AAOX4. PT IN NO APPARENT DISTRESS OR PAIN. THE OPPORTUNITY TO ASK QUESTIONS WAS PROVIDED. 

## 2016-05-18 ENCOUNTER — Ambulatory Visit: Payer: Medicaid Other | Admitting: "Endocrinology

## 2016-05-19 LAB — TESTOSTERONE TOTAL,FREE,BIO, MALES
ALBUMIN: 4.2 g/dL (ref 3.6–5.1)
SEX HORMONE BINDING: 63 nmol/L — AB (ref 10–50)
TESTOSTERONE BIOAVAILABLE: 3.1 ng/dL — AB (ref 44.0–417.0)
TESTOSTERONE FREE: 1.6 pg/mL (ref 0.6–159.0)
TESTOSTERONE: 23 ng/dL — AB (ref 250–827)

## 2016-05-19 LAB — TSH: TSH: 1.73 m[IU]/L (ref 0.50–4.30)

## 2016-05-19 LAB — T3, FREE: T3, Free: 2.9 pg/mL — ABNORMAL LOW (ref 3.0–4.7)

## 2016-05-19 LAB — FOLLICLE STIMULATING HORMONE: FSH: 1.5 m[IU]/mL — ABNORMAL LOW (ref 1.6–8.0)

## 2016-05-19 LAB — ESTRADIOL: Estradiol: <15 pg/mL (ref <=39)

## 2016-05-19 LAB — T4, FREE: Free T4: 1 ng/dL (ref 0.8–1.4)

## 2016-05-19 LAB — LUTEINIZING HORMONE: LH: 0.6 m[IU]/mL — ABNORMAL LOW (ref 1.5–9.3)

## 2016-05-21 LAB — IGF BINDING PROTEIN 3, BLOOD: IGF BINDING PROTEIN 3: 4 mg/L (ref 3.1–7.9)

## 2016-05-22 LAB — INSULIN-LIKE GROWTH FACTOR
IGF-I, LC/MS: 79 ng/mL — AB (ref 108–548)
Z-Score (Male): -2.9 SD — ABNORMAL LOW (ref ?–2.0)

## 2016-05-23 ENCOUNTER — Emergency Department (HOSPITAL_COMMUNITY)
Admission: EM | Admit: 2016-05-23 | Discharge: 2016-05-23 | Disposition: A | Discharge status: Home / Self Care | Payer: Medicaid Other | Attending: Emergency Medicine | Admitting: Emergency Medicine

## 2016-05-23 ENCOUNTER — Encounter (HOSPITAL_COMMUNITY): Payer: Self-pay | Admitting: Emergency Medicine

## 2016-05-23 DIAGNOSIS — R112 Nausea with vomiting, unspecified: Secondary | ICD-10-CM | POA: Diagnosis present

## 2016-05-23 DIAGNOSIS — Z79899 Other long term (current) drug therapy: Secondary | ICD-10-CM | POA: Insufficient documentation

## 2016-05-23 DIAGNOSIS — G43A0 Cyclical vomiting, not intractable: Secondary | ICD-10-CM | POA: Insufficient documentation

## 2016-05-23 DIAGNOSIS — R1013 Epigastric pain: Secondary | ICD-10-CM | POA: Diagnosis not present

## 2016-05-23 DIAGNOSIS — R1115 Cyclical vomiting syndrome unrelated to migraine: Secondary | ICD-10-CM

## 2016-05-23 DIAGNOSIS — E039 Hypothyroidism, unspecified: Secondary | ICD-10-CM | POA: Diagnosis not present

## 2016-05-23 DIAGNOSIS — R1011 Right upper quadrant pain: Secondary | ICD-10-CM | POA: Diagnosis not present

## 2016-05-23 LAB — CBC WITH DIFFERENTIAL/PLATELET
Basophils Absolute: 0 10*3/uL (ref 0.0–0.1)
Basophils Relative: 0 %
Eosinophils Absolute: 0 10*3/uL (ref 0.0–0.7)
Eosinophils Relative: 0 %
HCT: 44 % (ref 39.0–52.0)
Hemoglobin: 14.8 g/dL (ref 13.0–17.0)
Lymphocytes Relative: 20 %
Lymphs Abs: 2.1 10*3/uL (ref 0.7–4.0)
MCH: 29.7 pg (ref 26.0–34.0)
MCHC: 33.6 g/dL (ref 30.0–36.0)
MCV: 88.2 fL (ref 78.0–100.0)
Monocytes Absolute: 0.5 10*3/uL (ref 0.1–1.0)
Monocytes Relative: 5 %
Neutro Abs: 7.9 10*3/uL — ABNORMAL HIGH (ref 1.7–7.7)
Neutrophils Relative %: 75 %
Platelets: 249 10*3/uL (ref 150–400)
RBC: 4.99 MIL/uL (ref 4.22–5.81)
RDW: 13.1 % (ref 11.5–15.5)
WBC: 10.5 10*3/uL (ref 4.0–10.5)

## 2016-05-23 LAB — COMPREHENSIVE METABOLIC PANEL
ALT: 25 U/L (ref 17–63)
AST: 25 U/L (ref 15–41)
Albumin: 5.1 g/dL — ABNORMAL HIGH (ref 3.5–5.0)
Alkaline Phosphatase: 116 U/L (ref 38–126)
Anion gap: 12 (ref 5–15)
BUN: 13 mg/dL (ref 6–20)
CO2: 25 mmol/L (ref 22–32)
Calcium: 10 mg/dL (ref 8.9–10.3)
Chloride: 104 mmol/L (ref 101–111)
Creatinine, Ser: 0.88 mg/dL (ref 0.61–1.24)
GFR calc Af Amer: >60 mL/min (ref >60)
GFR calc non Af Amer: >60 mL/min (ref >60)
Glucose, Bld: 89 mg/dL (ref 65–99)
Potassium: 3.9 mmol/L (ref 3.5–5.1)
Sodium: 141 mmol/L (ref 135–145)
Total Bilirubin: 0.7 mg/dL (ref 0.3–1.2)
Total Protein: 8.7 g/dL — ABNORMAL HIGH (ref 6.5–8.1)

## 2016-05-23 LAB — LIPASE, BLOOD: Lipase: 12 U/L (ref 11–51)

## 2016-05-23 MED ORDER — PROMETHAZINE HCL 25 MG/ML IJ SOLN
12.5000 mg | Freq: Once | INTRAMUSCULAR | Status: AC
  Administered 2016-05-23: 12.5 mg via INTRAVENOUS
  Filled 2016-05-23: qty 1

## 2016-05-23 MED ORDER — SODIUM CHLORIDE 0.9 % IV BOLUS (SEPSIS)
1000.0000 mL | Freq: Once | INTRAVENOUS | Status: AC
  Administered 2016-05-23: 1000 mL via INTRAVENOUS

## 2016-05-23 MED ORDER — HYDROMORPHONE HCL 1 MG/ML IJ SOLN
0.5000 mg | Freq: Once | INTRAMUSCULAR | Status: AC
  Administered 2016-05-23: 0.5 mg via INTRAVENOUS
  Filled 2016-05-23: qty 1

## 2016-05-23 NOTE — ED Triage Notes (Signed)
Patient c/o abd pain and n/v x 2-3 days.  Patient has medications and don't seem to help.

## 2016-05-23 NOTE — ED Notes (Signed)
Patient's mother reports multiple previous GI studies. Currently takes Omeprazole without relief.

## 2016-05-23 NOTE — ED Provider Notes (Signed)
[facility]-EMERGENCY DEPT Provider [id number]: [id number] Arrival date & time: 05/23/16  4098  First Provider Contact:  None   By signing my name below, I, Placido Sou, attest that this documentation has been prepared under the direction and in the presence of Raeford Razor, MD. Electronically Signed: Placido Sou, ED Scribe. 05/23/16. 9:39 AM.   History   Chief Complaint Chief Complaint  Patient presents with  . Abdominal Pain  . Vomiting    HPI HPI Comments: Charles Beard is a 20 y.o. male with a PMHx of cyclic vomiting syndrome who presents to the Emergency Department complaining of worsening, moderate, n/v onset yesterday morning. Pt states his symptoms began yesterday morning and are consistent with prior cyclic vomiting exacerbations. He reports associated n/v, chills and upper/right sided abd pain. He has taken Phenergan, Omeprazole and Zofran w/o relief and denies having taken anything for pain management. His n/v worsens when consuming food or drink and his mother states he has not been able to keep down his medications. His GI specialist is at [facility]. He denies fevers, diarrhea, constipation or any other associated symptoms at this time.   The history is provided by the patient and a parent. No language interpreter was used.    Past Medical History:  Diagnosis Date  . ADHD (attention deficit hyperactivity disorder) 2000  . Anxiety   . Depression   . Headache(784.0)   . Hypothyroidism   . Prediabetes   . Puberty delay     Patient Active Problem List   Diagnosis Date Noted  . Elevated hemoglobin A1c 03/12/2015  . Essential hypertension, benign 03/12/2015  . 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 Dyspepsia 11/21/2013  . 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 MDD (major depressive disorder), single episode, severe , no psychosis (HCC) 06/15/2013  . Suicide (HCC) 06/15/2013  . ADHD (attention deficit hyperactivity disorder), combined type 06/15/2013  .  GAD (generalized anxiety disorder) 06/15/2013  . Puberty delay 05/23/2013  . Delayed linear growth 05/23/2013  . Overweight peds (BMI 85-94.9 percentile) 05/23/2013  . Goiter 05/23/2013  . Acanthosis nigricans, acquired 05/23/2013  . Gynecomastia, male 05/23/2013    Past Surgical History:  Procedure Laterality Date  . PLANTAR'S WART EXCISION       Home Medications    Prior to Admission medications   Medication Sig Start Date End Date Taking? Authorizing Provider  calcium carbonate (OS-CAL) 600 MG TABS tablet Take 600 mg by mouth every evening. Reported on 04/05/2016   Yes Historical Provider, MD  carbamazepine (TEGRETOL XR) 200 MG 12 hr tablet Take 200 mg by mouth 2 (two) times daily. Reported on 04/05/2016   Yes Historical Provider, MD  escitalopram (LEXAPRO) 20 MG tablet Take 20 mg by mouth daily. Reported on 04/05/2016   Yes Historical Provider, MD  guanFACINE (INTUNIV) 1 MG TB24 Take 1 mg by mouth daily. Reported on 04/05/2016   Yes Historical Provider, MD  loratadine (CLARITIN) 10 MG tablet Take 10 mg by mouth daily as needed for allergies. Reported on 04/05/2016   Yes Historical Provider, MD  Melatonin 10 MG TABS Take 10 mg by mouth at bedtime.   Yes Historical Provider, MD  Multiple Vitamin (MULTIVITAMIN WITH MINERALS) TABS tablet Take 1 tablet by mouth daily.   Yes Historical Provider, MD  omeprazole (PRILOSEC) 40 MG capsule Take 1 capsule (40 mg total) by mouth daily. 05/08/16  Yes Vanetta Mulders, MD  ondansetron (ZOFRAN ODT) 4 MG disintegrating tablet Take 1 tablet (4 mg total) by mouth every  8 (eight) hours as needed for nausea or vomiting. 05/08/16  Yes Vanetta Mulders, MD  promethazine (PHENERGAN) 25 MG suppository Place 1 suppository (25 mg total) rectally every 6 (six) hours as needed for nausea or vomiting. 03/28/16  Yes Bethel Born, [name]  promethazine (PHENERGAN) 25 MG tablet Take 1 tablet (25 mg total) by mouth every 8 (eight) hours as needed for nausea or vomiting. 04/05/16  Yes  Amy S Esterwood, [name]  SYNTHROID 25 MCG tablet TAKE 1 TABLET BY MOUTH EVERY DAY BEFORE BREAKFAST 05/02/16  Yes [name], MD  LORazepam (ATIVAN) 0.5 MG tablet Take 1 tablet (0.5 mg total) by mouth every 8 (eight) hours as needed for anxiety (nausea). Patient not taking: Reported on 04/05/2016 01/05/16   [name], MD    Family History Family History  Problem Relation Age of Onset  . Thyroid disease Mother   . Diabetes Maternal Grandmother     and MGGM  . Heart disease Maternal Grandmother     and MGGM  . Breast cancer Other     and MGGM    Social History Social History  Substance Use Topics  . Smoking status: Never Smoker  . Smokeless tobacco: Never Used  . Alcohol use No     Allergies   Reglan [metoclopramide]   Review of Systems Review of Systems  Constitutional: Positive for appetite change and chills. Negative for fever.  Gastrointestinal: Positive for abdominal pain, nausea and vomiting. Negative for constipation and diarrhea.  All other systems reviewed and are negative.  Physical Exam Updated Vital Signs BP 128/85   Pulse (!) 54   Temp 98.4 F (36.9 C) (Oral)   Resp 19   SpO2 100%   Physical Exam  Constitutional: He is oriented to person, place, and time. He appears well-developed and well-nourished. He appears distressed.  Sitting up retching during examination; appears uncomfortable  HENT:  Head: Normocephalic.  Eyes: EOM are normal.  Neck: Normal range of motion.  Pulmonary/Chest: Effort normal.  Abdominal: Soft. He exhibits no distension. There is tenderness. There is no rebound and no guarding.  Epigastric and RUQ TTP without rebound or guarding  Musculoskeletal: Normal range of motion.  Neurological: He is alert and oriented to person, place, and time.  Psychiatric: He has a normal mood and affect.  Nursing note and vitals reviewed.  ED Treatments / Results  Labs (all labs ordered are listed, but only abnormal results are  displayed) Labs Reviewed  CBC WITH DIFFERENTIAL/PLATELET - Abnormal; Notable for the following:       Result Value   Neutro Abs 7.9 (*)    All other components within normal limits  COMPREHENSIVE METABOLIC PANEL - Abnormal; Notable for the following:    Total Protein 8.7 (*)    Albumin 5.1 (*)    All other components within normal limits  LIPASE, BLOOD   EKG  EKG Interpretation None       Radiology No results found.  Procedures Procedures  DIAGNOSTIC STUDIES: Oxygen Saturation is 100% on RA, normal by my interpretation.    COORDINATION OF CARE: 9:21 AM Discussed next steps with pt and his mother. They verbalized understanding and are agreeable with the plan.    Medications Ordered in ED Medications  sodium chloride 0.9 % bolus 1,000 mL (1,000 mLs Intravenous New Bag/Given 05/23/16 1003)  promethazine (PHENERGAN) injection 12.5 mg (12.5 mg Intravenous Given 05/23/16 1003)  HYDROmorphone (DILAUDID) injection 0.5 mg (0.5 mg Intravenous Given 05/23/16 1003)  Initial Impression / Assessment and Plan / ED Course  I have reviewed the triage vital signs and the nursing notes.  Pertinent labs & imaging results that were available during my care of the patient were reviewed by me and considered in my medical decision making (see chart for details).  Clinical Course  20 year old male with abdominal pain and nausea/vomiting. Symptoms have now sniffily improved. He has a long-standing history of similar complaints. I do not feel that further workup is needed. His repeat abdominal exam is much improved. He caretaker comfortable with discharge. Return precautions discussed. He has established outpatient follow-up.  I personally preformed the services scribed in my presence. The recorded information has been reviewed is accurate. Raeford Razor, MD.    Final Clinical Impressions(s) / ED Diagnoses   Final diagnoses:  Non-intractable cyclical vomiting with nausea    New  Prescriptions New Prescriptions   No medications on file     Raeford Razor, MD 06/06/16 1028

## 2016-05-24 ENCOUNTER — Encounter: Payer: Self-pay | Admitting: "Endocrinology

## 2016-05-24 ENCOUNTER — Ambulatory Visit (INDEPENDENT_AMBULATORY_CARE_PROVIDER_SITE_OTHER): Payer: Medicaid Other | Admitting: "Endocrinology

## 2016-05-24 ENCOUNTER — Emergency Department (HOSPITAL_COMMUNITY): Payer: Medicaid Other

## 2016-05-24 ENCOUNTER — Encounter (HOSPITAL_COMMUNITY): Payer: Self-pay | Admitting: Emergency Medicine

## 2016-05-24 ENCOUNTER — Inpatient Hospital Stay (HOSPITAL_COMMUNITY)
Admission: EM | Admit: 2016-05-24 | Discharge: 2016-05-27 | DRG: 103 | Disposition: A | Discharge status: Home / Self Care | Payer: Medicaid Other | Attending: Internal Medicine | Admitting: Internal Medicine

## 2016-05-24 ENCOUNTER — Telehealth: Payer: Self-pay | Admitting: Physician Assistant

## 2016-05-24 VITALS — BP 121/84 | HR 51 | Ht 66.0 in | Wt 149.0 lb

## 2016-05-24 DIAGNOSIS — R1110 Vomiting, unspecified: Secondary | ICD-10-CM

## 2016-05-24 DIAGNOSIS — K319 Disease of stomach and duodenum, unspecified: Secondary | ICD-10-CM | POA: Diagnosis present

## 2016-05-24 DIAGNOSIS — F329 Major depressive disorder, single episode, unspecified: Secondary | ICD-10-CM | POA: Diagnosis present

## 2016-05-24 DIAGNOSIS — E063 Autoimmune thyroiditis: Secondary | ICD-10-CM | POA: Diagnosis present

## 2016-05-24 DIAGNOSIS — E039 Hypothyroidism, unspecified: Secondary | ICD-10-CM | POA: Diagnosis present

## 2016-05-24 DIAGNOSIS — R7303 Prediabetes: Secondary | ICD-10-CM | POA: Diagnosis not present

## 2016-05-24 DIAGNOSIS — Z79899 Other long term (current) drug therapy: Secondary | ICD-10-CM

## 2016-05-24 DIAGNOSIS — R1115 Cyclical vomiting syndrome unrelated to migraine: Secondary | ICD-10-CM

## 2016-05-24 DIAGNOSIS — R079 Chest pain, unspecified: Secondary | ICD-10-CM | POA: Diagnosis not present

## 2016-05-24 DIAGNOSIS — R112 Nausea with vomiting, unspecified: Secondary | ICD-10-CM | POA: Diagnosis present

## 2016-05-24 DIAGNOSIS — K219 Gastro-esophageal reflux disease without esophagitis: Secondary | ICD-10-CM | POA: Insufficient documentation

## 2016-05-24 DIAGNOSIS — R1084 Generalized abdominal pain: Secondary | ICD-10-CM

## 2016-05-24 DIAGNOSIS — F1290 Cannabis use, unspecified, uncomplicated: Secondary | ICD-10-CM | POA: Diagnosis present

## 2016-05-24 DIAGNOSIS — F902 Attention-deficit hyperactivity disorder, combined type: Secondary | ICD-10-CM | POA: Diagnosis present

## 2016-05-24 DIAGNOSIS — G43A1 Cyclical vomiting, intractable: Principal | ICD-10-CM | POA: Diagnosis present

## 2016-05-24 DIAGNOSIS — Z888 Allergy status to other drugs, medicaments and biological substances status: Secondary | ICD-10-CM

## 2016-05-24 DIAGNOSIS — L83 Acanthosis nigricans: Secondary | ICD-10-CM | POA: Diagnosis present

## 2016-05-24 DIAGNOSIS — R110 Nausea: Secondary | ICD-10-CM | POA: Diagnosis present

## 2016-05-24 DIAGNOSIS — Z8249 Family history of ischemic heart disease and other diseases of the circulatory system: Secondary | ICD-10-CM

## 2016-05-24 DIAGNOSIS — E300 Delayed puberty: Secondary | ICD-10-CM | POA: Diagnosis present

## 2016-05-24 DIAGNOSIS — F411 Generalized anxiety disorder: Secondary | ICD-10-CM | POA: Diagnosis present

## 2016-05-24 DIAGNOSIS — E069 Thyroiditis, unspecified: Secondary | ICD-10-CM | POA: Diagnosis present

## 2016-05-24 DIAGNOSIS — Z833 Family history of diabetes mellitus: Secondary | ICD-10-CM

## 2016-05-24 DIAGNOSIS — K449 Diaphragmatic hernia without obstruction or gangrene: Secondary | ICD-10-CM | POA: Diagnosis present

## 2016-05-24 LAB — CBC WITH DIFFERENTIAL/PLATELET
BASOS ABS: 0 10*3/uL (ref 0.0–0.1)
Basophils Relative: 0 %
Eosinophils Absolute: 0.1 10*3/uL (ref 0.0–0.7)
Eosinophils Relative: 1 %
HEMATOCRIT: 39.2 % (ref 39.0–52.0)
HEMOGLOBIN: 13.2 g/dL (ref 13.0–17.0)
LYMPHS PCT: 32 %
Lymphs Abs: 3.2 10*3/uL (ref 0.7–4.0)
MCH: 28.8 pg (ref 26.0–34.0)
MCHC: 33.7 g/dL (ref 30.0–36.0)
MCV: 85.4 fL (ref 78.0–100.0)
Monocytes Absolute: 0.7 10*3/uL (ref 0.1–1.0)
Monocytes Relative: 6 %
NEUTROS ABS: 6.2 10*3/uL (ref 1.7–7.7)
NEUTROS PCT: 61 %
PLATELETS: 242 10*3/uL (ref 150–400)
RBC: 4.59 MIL/uL (ref 4.22–5.81)
RDW: 13.4 % (ref 11.5–15.5)
WBC: 10.1 10*3/uL (ref 4.0–10.5)

## 2016-05-24 LAB — RAPID URINE DRUG SCREEN, HOSP PERFORMED
Amphetamines: NOT DETECTED
BARBITURATES: NOT DETECTED
BENZODIAZEPINES: NOT DETECTED
COCAINE: NOT DETECTED
Opiates: NOT DETECTED
TETRAHYDROCANNABINOL: POSITIVE — AB

## 2016-05-24 LAB — COMPREHENSIVE METABOLIC PANEL
ALT: 19 U/L (ref 17–63)
ANION GAP: 9 (ref 5–15)
AST: 19 U/L (ref 15–41)
Albumin: 4.5 g/dL (ref 3.5–5.0)
Alkaline Phosphatase: 96 U/L (ref 38–126)
BILIRUBIN TOTAL: 0.8 mg/dL (ref 0.3–1.2)
BUN: 9 mg/dL (ref 6–20)
CO2: 24 mmol/L (ref 22–32)
Calcium: 9.3 mg/dL (ref 8.9–10.3)
Chloride: 109 mmol/L (ref 101–111)
Creatinine, Ser: 0.81 mg/dL (ref 0.61–1.24)
GFR calc Af Amer: >60 mL/min (ref >60)
Glucose, Bld: 90 mg/dL (ref 65–99)
POTASSIUM: 3.5 mmol/L (ref 3.5–5.1)
Sodium: 142 mmol/L (ref 135–145)
TOTAL PROTEIN: 7.3 g/dL (ref 6.5–8.1)

## 2016-05-24 LAB — TSH: TSH: 1.055 u[IU]/mL (ref 0.350–4.500)

## 2016-05-24 LAB — POCT GLYCOSYLATED HEMOGLOBIN (HGB A1C): Hemoglobin A1C: 5.4

## 2016-05-24 LAB — LIPASE, BLOOD: Lipase: 11 U/L (ref 11–51)

## 2016-05-24 LAB — GLUCOSE, POCT (MANUAL RESULT ENTRY): POC GLUCOSE: 88 mg/dL (ref 70–99)

## 2016-05-24 MED ORDER — POTASSIUM CHLORIDE IN NACL 40-0.9 MEQ/L-% IV SOLN
INTRAVENOUS | Status: DC
  Administered 2016-05-24 – 2016-05-25 (×2): 75 mL/h via INTRAVENOUS
  Filled 2016-05-24 (×3): qty 1000

## 2016-05-24 MED ORDER — LORAZEPAM 0.5 MG PO TABS
0.5000 mg | ORAL_TABLET | Freq: Three times a day (TID) | ORAL | Status: DC | PRN
  Administered 2016-05-24 – 2016-05-25 (×2): 0.5 mg via ORAL
  Filled 2016-05-24 (×2): qty 1

## 2016-05-24 MED ORDER — GUANFACINE HCL ER 1 MG PO TB24
1.0000 mg | ORAL_TABLET | Freq: Every day | ORAL | Status: DC
  Administered 2016-05-24 – 2016-05-26 (×2): 1 mg via ORAL
  Filled 2016-05-24 (×3): qty 1

## 2016-05-24 MED ORDER — ACETAMINOPHEN 325 MG PO TABS: 650.0000 mg | ORAL_TABLET | Freq: Four times a day (QID) | ORAL | Status: DC | PRN

## 2016-05-24 MED ORDER — CARBAMAZEPINE ER 200 MG PO TB12
200.0000 mg | ORAL_TABLET | Freq: Two times a day (BID) | ORAL | Status: DC
  Administered 2016-05-24 – 2016-05-26 (×3): 200 mg via ORAL
  Filled 2016-05-24 (×6): qty 1

## 2016-05-24 MED ORDER — CALCIUM CARBONATE 1250 (500 CA) MG PO TABS
1250.0000 mg | ORAL_TABLET | Freq: Every evening | ORAL | Status: DC
  Administered 2016-05-24: 1250 mg via ORAL
  Filled 2016-05-24 (×2): qty 1

## 2016-05-24 MED ORDER — DIPHENHYDRAMINE HCL 50 MG/ML IJ SOLN
25.0000 mg | Freq: Once | INTRAMUSCULAR | Status: AC
  Administered 2016-05-24: 25 mg via INTRAVENOUS
  Filled 2016-05-24: qty 1

## 2016-05-24 MED ORDER — LORATADINE 10 MG PO TABS: 10.0000 mg | ORAL_TABLET | Freq: Every day | ORAL | Status: DC | PRN

## 2016-05-24 MED ORDER — SODIUM CHLORIDE 0.9 % IV SOLN
8.0000 mg | Freq: Four times a day (QID) | INTRAVENOUS | Status: DC | PRN
  Administered 2016-05-25 – 2016-05-27 (×3): 8 mg via INTRAVENOUS
  Filled 2016-05-24 (×7): qty 4

## 2016-05-24 MED ORDER — DIPHENOXYLATE-ATROPINE 2.5-0.025 MG PO TABS
2.0000 | ORAL_TABLET | Freq: Once | ORAL | Status: AC
  Administered 2016-05-24: 2 via ORAL
  Filled 2016-05-24: qty 2

## 2016-05-24 MED ORDER — SODIUM CHLORIDE 0.9 % IV BOLUS (SEPSIS)
1000.0000 mL | Freq: Once | INTRAVENOUS | Status: AC
  Administered 2016-05-24: 1000 mL via INTRAVENOUS

## 2016-05-24 MED ORDER — FAMOTIDINE IN NACL 20-0.9 MG/50ML-% IV SOLN
20.0000 mg | Freq: Two times a day (BID) | INTRAVENOUS | Status: DC
  Administered 2016-05-24 – 2016-05-25 (×2): 20 mg via INTRAVENOUS
  Filled 2016-05-24 (×3): qty 50

## 2016-05-24 MED ORDER — LEVOTHYROXINE SODIUM 25 MCG PO TABS
25.0000 ug | ORAL_TABLET | Freq: Every day | ORAL | Status: DC
  Filled 2016-05-24: qty 1

## 2016-05-24 MED ORDER — MELATONIN 10 MG PO TABS: 10.0000 mg | ORAL_TABLET | Freq: Every day | ORAL | Status: DC

## 2016-05-24 MED ORDER — ACETAMINOPHEN 650 MG RE SUPP: 650.0000 mg | Freq: Four times a day (QID) | RECTAL | Status: DC | PRN

## 2016-05-24 MED ORDER — ESCITALOPRAM OXALATE 20 MG PO TABS
20.0000 mg | ORAL_TABLET | Freq: Every day | ORAL | Status: DC
  Administered 2016-05-24: 20 mg via ORAL
  Filled 2016-05-24: qty 1
  Filled 2016-05-24: qty 2

## 2016-05-24 MED ORDER — ADULT MULTIVITAMIN W/MINERALS CH
1.0000 | ORAL_TABLET | Freq: Every day | ORAL | Status: DC
  Filled 2016-05-24: qty 1

## 2016-05-24 MED ORDER — ONDANSETRON HCL 4 MG/2ML IJ SOLN
4.0000 mg | Freq: Once | INTRAMUSCULAR | Status: AC
  Administered 2016-05-24: 4 mg via INTRAVENOUS
  Filled 2016-05-24: qty 2

## 2016-05-24 MED ORDER — PANTOPRAZOLE SODIUM 40 MG PO TBEC
40.0000 mg | DELAYED_RELEASE_TABLET | Freq: Two times a day (BID) | ORAL | Status: DC
  Administered 2016-05-24: 40 mg via ORAL
  Filled 2016-05-24: qty 1

## 2016-05-24 MED ORDER — PROMETHAZINE HCL 25 MG PO TABS
25.0000 mg | ORAL_TABLET | Freq: Three times a day (TID) | ORAL | Status: DC | PRN
  Administered 2016-05-24 – 2016-05-26 (×3): 25 mg via ORAL
  Filled 2016-05-24 (×3): qty 1

## 2016-05-24 MED ORDER — GADOBENATE DIMEGLUMINE 529 MG/ML IV SOLN
15.0000 mL | Freq: Once | INTRAVENOUS | Status: AC | PRN
  Administered 2016-05-24: 14 mL via INTRAVENOUS

## 2016-05-24 MED ORDER — PROMETHAZINE HCL 25 MG/ML IJ SOLN
25.0000 mg | Freq: Once | INTRAMUSCULAR | Status: AC
  Administered 2016-05-24: 25 mg via INTRAVENOUS
  Filled 2016-05-24: qty 1

## 2016-05-24 NOTE — ED Notes (Signed)
GI specialist at bedside. MRI will call to see if pt is ready for MRI in about 20 min

## 2016-05-24 NOTE — ED Notes (Signed)
Patient and patient's mother requesting pain medication. [name], Cartner informed of same.

## 2016-05-24 NOTE — ED Provider Notes (Signed)
Care assumed from [facility], [name] at end of shift. In brief, Charles Beard is an 20 y.o. male with history of hypothyroidism, prediabetes, and cyclical vomiting syndrome who was sent to the ED for evaluation of nausea and vomiting by his endocrinologist. Pt reports these symptoms for the past three years. He saw GI earlier this month with negative EGD and gastric emptying studies. He was sent to the ED today by Dr. Fransico Michael for further workup including MRI brain with and without contrast as well as GI consultation. Dispo is now pending MR and GI consult. MRI has not been obtained yet. GI team has not been to see pt in the ED yet. We anticipate with an unremarkable MRI dispo will be to d/c home with GI recommendations.    Physical Exam  BP 144/80   Pulse (!) 51   Temp 97.9 F (36.6 C) (Oral)   Resp 18   SpO2 100%   Physical Exam  Constitutional: He is oriented to person, place, and time. No distress.  Appears younger than stated age  HENT:  Head: Atraumatic.  Right Ear: External ear normal.  Left Ear: External ear normal.  Nose: Nose normal.  Eyes: Conjunctivae are normal. No scleral icterus.  Cardiovascular: Normal rate and regular rhythm.   Pulmonary/Chest: Effort normal. No respiratory distress.  Neurological: He is alert and oriented to person, place, and time.  Skin: Skin is warm and dry. He is not diaphoretic.  Psychiatric: His behavior is normal. His mood appears anxious.  Nursing note and vitals reviewed.     ED Course  Procedures  4:23 PM GI just evaluated pt at bedside. They recommend a medical admission with plans for EGD tomorrow.  4:43 PM I spoke with Dr. Thedore Mins who will admit pt to obs. We will ensure pt remains in the ED and does not go to MRI until Dr. Thedore Mins has seen pt at bedside. Appreciate assistance.    Carlene Coria, [name] 05/24/16 1643    Mancel Bale, MD 05/31/16 [phone]

## 2016-05-24 NOTE — Telephone Encounter (Signed)
I have not seen this patient yet but reviewed Amy's note. This is a chronic issue for which he has had multiple ER visits. Negative CT abdomen, gastric emptying study, and SBFT. He had previously tested positive for [facility] and Amy had recommended he avoid smoking marijuana altogether, if he has not done this, this is the first step. Otherwise, he can be seen by APP. I think he warrants an EGD to complete his workup, but suspect he may more likely have cyclical vomiting syndrome. If he isn't taking any antiemetics we can give him some zofran. Thanks

## 2016-05-24 NOTE — ED Notes (Signed)
Patient transported to MRI 

## 2016-05-24 NOTE — ED Notes (Signed)
Patient's mother requesting additional nausea medication. [name] Cartner made aware of same.

## 2016-05-24 NOTE — ED Triage Notes (Signed)
Pt has been having nausea and vomiting since Friday and abdominal pain began yesterday. Abdominal pain is generalized abdominal throughout left and right upper quadrants.  N/V has gotten worse since this morning.  Seen here in ED yesterday and was discharged to follow up with primary doctor/GI.  Called GI today that said that there is PA from [facility] here today that he would like for patient to see.

## 2016-05-24 NOTE — Patient Instructions (Signed)
Mom will call me to schedule afollow up appointment.

## 2016-05-24 NOTE — ED Notes (Signed)
PT to MRI

## 2016-05-24 NOTE — ED Notes (Signed)
Report given to unit. Pt will go to unit following MRI

## 2016-05-24 NOTE — Progress Notes (Signed)
Subjective:  Patient Name: Charles Beard Date of Birth: 05/23/1996  MRN: [id number]  Charles Beard  presents to the office today for follow up evaluation and management of his puberty delay, obesity, gynecomastia, and linear growth delay/short stature.   HISTORY OF PRESENT ILLNESS:   Charles Beard is a 20 y.o. Caucasian young man.   [name] was accompanied by his mother.  1. Charles Beard's initial [facility] pediatric endocrine consultation was on 05/22/13 at age 34  A. [name]: Term, emergency C-section for failure to progress.  Birth weight: 7 lbs-15 oz. Healthy newborn.  B. Infancy: Healthy  C. Childhood: ADHD was diagnosed about age 71. Griffin was followed at [facility] and [facility]. Stimulants and non-stimulants did not work.  He also had severe anger issues. He sometimes bit himself or hit things, but had not hurt himself seriously. He was hit in the forehead by a pitched baseball at about age 32-8. There was no loss of consciousness. His only surgery was curettage of plantar warts. He had seasonal allergies, but no medication allergies.    D. Obesity: Mom had been concerned about his weight gain for several years. Mom tried to get him to eat healthy. According to mom, dad fed him whatever he wanted. Neither dad nor Charles Beard exercised.   E. Puberty delay: He had no axillary hair, pubic hair, or genital development.  F. Pertinent family history: Strong FH of obesity in mom and  maternal relatives. FH of T2DM in maternal grandmother. No FH of delayed puberty. Hypothyroidism in mom, MGM, MGGM. Neither mom nor MGM had thyroid surgery or irradiation. Menopause at age 83 in mom, MGM, and MGGM. Mental health issues on dad's side of family. Dad had severe mood swings. Dad was an alcoholic and PGF was an alcoholic. [Addendum 11/18/14: Dad also used drugs. Mom and dad divorced when Golden was age 36. Mom also had depression.]  G. On exam, his BP was elevated at 122/87. He was quite agitated during the visit,  supposedly because he was angry that mom was sharing his personal health information with yet another person. He was significantly overweight. He had a 19-20 gram goiter. His breasts were fatty with a Tanner stage 3 appearance and enlarged areolae, but no breast buds. His abdomen was enlarged. His pubic hair was Tanner stage 1. His testes were 2-3 mL in volume, very early pubertal. Lab tests showed normal CMP,TFTs, and TPO antibody levels. His FSH was 2.2, LH 0.3,  testosterone 63, and estradiol 15.4, all c/w being in early puberty.  H. I diagnosed pubertal delay, linear growth delay, early gynecomastia, overweight, dyspepsia, goiter, and acanthosis. I instructed Charles Beard and his mother on our Eat Right Diet and proper exercise techniques. I also started him on ranitidine, 150 mg, twice daily.   2. During the past three years Charles Beard has had slow progression of puberty. As he has grown older and has had adjustments in his psychiatric medications, he has also become more emotionally stable and less emotionally reactive. 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 He has had recurrent problems with cyclical vomiting syndrome.   3. Charles Beard's last [facility] visit was on 11/25/15.   A. In the interim he has been generally healthy, but his acid indigestion and reflux have been worse. He has had three visits to the ED for acid reflux, vomiting, but no diarrhea.  His appetite is good when he is not having GI problems. He has been walking  and riding his bike every day for about 45 minutes when he feels well. Unfortunately, the heat and humidity increase his nausea and vomiting. He has not had many problems with pollen allergies, so only takes loratadine as needed.   B. He takes Tegretol, Tenex, Lexapro, and omeprazole when he is not too sick to keep the meds down. . Mom took him off Concerta since he has not been in school. Charles Beard He remains on Synthroid, 25 mcg/day.  Charles Beard  continues to see his psychiatrist, Dr. [name]. His anxiety and depression have been much better since starting Lexapro and changing to Tenex.    D. He had psychological testing in the past. Charles Beard was found to have "a little bit of a low IQ". ADD was also noted. An individual IEP was developed. Last year he was in some classes with only 4 students per Beard.  E. Mom feels that the sicker he gets the more anxious he has, then the sicker he feels, etc, etc, etc  3.  Pertinent Review of Systems:  Constitutional: He feels "sick and weak" today. He has nausea, acid indigestion, reflux, and diffuse abdominal pains.  Eyes: Vision seems to be good, better if he wears his glasses. He had a new eye exam on 06/01/15 and has a new pair of eye glasses.There are no recognized eye problems. He is scheduled for an eye exam soon.  Neck: The patient has no complaints of anterior neck swelling, soreness, tenderness, pressure, discomfort, or difficulty swallowing.   Heart: Heart rate increases with exercise or other physical activity. The patient has no complaints of palpitations, irregular heart beats, chest pain, or chest pressure.   Gastrointestinal: As above. He is sometimes constipated. Legs: Muscle mass and strength seem normal. There are no complaints of numbness, tingling, burning, or pain. No edema is noted.  Feet: There are no complaints of numbness, tingling, burning, or pain. No edema is noted. Neurologic: There are no recognized problems with muscle movement and strength, sensation, or coordination. GU: He has more pubic hair and axillary hair. He says that his genitalia have increased. He no longer seems to have frequent difficulties urinating.  PAST MEDICAL, FAMILY, AND SOCIAL HISTORY  Past Medical History:  Diagnosis Date  . ADHD (attention deficit hyperactivity disorder) 2000  . Anxiety   . Depression   . Headache(784.0)   . Hypothyroidism   . Prediabetes   . Puberty delay     Family History   Problem Relation Age of Onset  . Thyroid disease Mother   . Diabetes Maternal Grandmother     and MGGM  . Heart disease Maternal Grandmother     and MGGM  . Breast cancer Other     and MGGM     Current Outpatient Prescriptions:  .  calcium carbonate (OS-CAL) 600 MG TABS tablet, Take 600 mg by mouth every evening. Reported on 04/05/2016, Disp: , Rfl:  .  carbamazepine (TEGRETOL XR) 200 MG 12 hr tablet, Take 200 mg by mouth 2 (two) times daily. Reported on 04/05/2016, Disp: , Rfl:  .  escitalopram (LEXAPRO) 20 MG tablet, Take 20 mg by mouth daily. Reported on 04/05/2016, Disp: , Rfl:  .  Melatonin 10 MG TABS, Take 10 mg by mouth at bedtime., Disp: , Rfl:  .  omeprazole (PRILOSEC) 40 MG capsule, Take 1 capsule (40 mg total) by mouth daily., Disp: 30 capsule, Rfl: 1 .  ondansetron (ZOFRAN ODT) 4 MG disintegrating tablet, Take 1 tablet (4 mg total) by  mouth every 8 (eight) hours as needed for nausea or vomiting., Disp: 20 tablet, Rfl: 2 .  promethazine (PHENERGAN) 25 MG tablet, Take 1 tablet (25 mg total) by mouth every 8 (eight) hours as needed for nausea or vomiting., Disp: 90 tablet, Rfl: 1 .  SYNTHROID 25 MCG tablet, TAKE 1 TABLET BY MOUTH EVERY DAY BEFORE BREAKFAST, Disp: 30 tablet, Rfl: 5 .  guanFACINE (INTUNIV) 1 MG TB24, Take 1 mg by mouth daily. Reported on 04/05/2016, Disp: , Rfl:  .  loratadine (CLARITIN) 10 MG tablet, Take 10 mg by mouth daily as needed for allergies. Reported on 04/05/2016, Disp: , Rfl:  .  LORazepam (ATIVAN) 0.5 MG tablet, Take 1 tablet (0.5 mg total) by mouth every 8 (eight) hours as needed for anxiety (nausea). (Patient not taking: Reported on 04/05/2016), Disp: 20 tablet, Rfl: 0 .  Multiple Vitamin (MULTIVITAMIN WITH MINERALS) TABS tablet, Take 1 tablet by mouth daily., Disp: , Rfl:   Allergies as of 05/24/2016 - Review Complete 05/24/2016  Allergen Reaction Noted  . Reglan [metoclopramide] Other (See Comments) 03/28/2016     reports that he has never smoked. He has  never used smokeless tobacco. He reports that he does not drink alcohol or use drugs. Pediatric History  Patient Guardian Status  . Mother:  Isael, Stille   Other Topics Concern  . Not on file   Social History Narrative   Lives at home with mom and 2 cats, visits dad every other weekend, will attend [employer], will start 9th grade in the fall.    1. School and Family: As above. He is in trying to finish high school online through a program in [place]. Bilbo will not return to public school. Franke lives with mom, but has a weekend day with dad occasionally.    2. Activities: His physical activities vary with his GI symptoms.  3. Primary Care Provider: Dr. Cheri Rous in [place], [facility], phone [phone] 4. Psychiatrist: Dr. Joan Flores Akintayo 5. GI: Ms [name], PA at [facility]  REVIEW OF SYSTEMS: There are no other significant problems involving Furqan's other body systems.   Objective:  Vital Signs: BP 119/84     HR: 54  BP 121/84   Pulse (!) 51   Ht  (1.676 m)   Wt 149 lb (67.6 kg)   BMI 24.05 kg/m    Ht Readings from Last 3 Encounters:  05/24/16  (1.676 m) (11 %, Z= -1.24)*  05/08/16  (1.702 m) (19 %, Z= -0.89)*  04/05/16  (1.676 m) (11 %, Z= -1.23)*   * Growth percentiles are based on CDC 2-20 Years data.   Wt Readings from Last 3 Encounters:  05/24/16 149 lb (67.6 kg) (45 %, Z= -0.12)*  05/08/16 145 lb (65.8 kg) (39 %, Z= -0.29)*  04/05/16 145 lb 2 oz (65.8 kg) (40 %, Z= -0.26)*   * Growth percentiles are based on CDC 2-20 Years data.   HC Readings from Last 3 Encounters:  No data found for [facility]   Body surface area is 1.77 meters squared. 11 %ile (Z= -1.24) based on CDC 2-20 Years stature-for-age data using vitals from 05/24/2016. 45 %ile (Z= -0.12) based on CDC 2-20 Years weight-for-age data using vitals from 05/24/2016.    PHYSICAL EXAM:  Constitutional: The patient appears weak and acutely ill. He  is bent over, burping a lot, crying, and sometimes writhing in pain. He is very antalgic and has difficulty concentrating on  the interview and exam. He has lost 3 more pounds since last visit. His weight has decreased to the 45.29%.   He is fairly alert, bright, and engaged today. His sense of humor is good. His affect is fairly normal. His insight is fair today. Today's visit went well. There was no overt emotional friction between Rock and mom. Mom ws more supportive and less critical. Alex was not overtly reactive. Head: The head is normocephalic. Face: The face appears normal. There are no obvious dysmorphic features or plethora. Eyes: The eyes appear to be normally formed and spaced. Gaze is conjugate. There is no obvious arcus or proptosis. Eyes are dry. Ears: The ears are normally placed and appear externally normal. Mouth: The oropharynx and tongue appear normal. Dentition appears to be normal for age. Mouth is dry.  There is no hyperpigmentation. He has a grade I-II fine, blond mustache.  Neck: The neck appears to be visibly enlarged. No carotid bruits are noted. The thyroid gland is still slightly enlarged at about 21-22 grams in size. The left lobe is again larger than the right. The consistency of the thyroid glans is normal today. The thyroid gland is non- tender. He has 1+ acanthosis.  Lungs: The lungs are clear to auscultation. Air movement is good. Heart: Heart rate and rhythm are regular. Heart sounds S1 and S2 are normal. I did not appreciate any pathologic cardiac murmurs. Abdomen: The abdomen is enlarged. Bowel sounds are normal. He is so diffusely tender that I was not able to perform an adequate exam.   Arms: Muscle size and bulk are normal for age. Hands: There is no obvious tremor. Phalangeal and metacarpophalangeal joints are normal. Palmar muscles are normal for age. Palmar skin is normal. Palmar moisture is also normal. Legs: Muscles appear normal for age. No edema is  present. Neurologic: Strength is normal for age in both the upper and lower extremities. Muscle tone is normal. Sensation to touch is normal in both legs.   Psych: He is very antalgic and distressed.    LAB DATA:   Results for orders placed or performed in visit on 05/24/16 (from the past 504 hour(s))  POCT Glucose (CBG)   Collection Time: 05/24/16 10:27 AM  Result Value Ref Range   POC Glucose 88 70 - 99 mg/dl  Results for orders placed or performed during the hospital encounter of 05/23/16 (from the past 504 hour(s))  CBC with Differential   Collection Time: 05/23/16 10:07 AM  Result Value Ref Range   WBC 10.5 4.0 - 10.5 K/uL   RBC 4.99 4.22 - 5.81 MIL/uL   Hemoglobin 14.8 13.0 - 17.0 g/dL   HCT 16.1 09.6 - 04.5 %   MCV 88.2 78.0 - 100.0 fL   MCH 29.7 26.0 - 34.0 pg   MCHC 33.6 30.0 - 36.0 g/dL   RDW 40.9 81.1 - 91.4 %   Platelets 249 150 - 400 K/uL   Neutrophils Relative % 75 %   Neutro Abs 7.9 (H) 1.7 - 7.7 K/uL   Lymphocytes Relative 20 %   Lymphs Abs 2.1 0.7 - 4.0 K/uL   Monocytes Relative 5 %   Monocytes Absolute 0.5 0.1 - 1.0 K/uL   Eosinophils Relative 0 %   Eosinophils Absolute 0.0 0.0 - 0.7 K/uL   Basophils Relative 0 %   Basophils Absolute 0.0 0.0 - 0.1 K/uL  Lipase, blood   Collection Time: 05/23/16 10:07 AM  Result Value Ref Range   Lipase 12 11 -  51 U/L  Comprehensive metabolic panel   Collection Time: 05/23/16 10:07 AM  Result Value Ref Range   Sodium 141 135 - 145 mmol/L   Potassium 3.9 3.5 - 5.1 mmol/L   Chloride 104 101 - 111 mmol/L   CO2 25 22 - 32 mmol/L   Glucose, Bld 89 65 - 99 mg/dL   BUN 13 6 - 20 mg/dL   Creatinine, Ser 1.61 0.61 - 1.24 mg/dL   Calcium 09.6 8.9 - 04.5 mg/dL   Total Protein 8.7 (H) 6.5 - 8.1 g/dL   Albumin 5.1 (H) 3.5 - 5.0 g/dL   AST 25 15 - 41 U/L   ALT 25 17 - 63 U/L   Alkaline Phosphatase 116 38 - 126 U/L   Total Bilirubin 0.7 0.3 - 1.2 mg/dL   GFR calc non Af Amer >60 >60 mL/min   GFR calc Af Amer >60 >60 mL/min    Anion gap 12 5 - 15  Results for orders placed or performed during the hospital encounter of 05/08/16 (from the past 504 hour(s))  Basic metabolic panel   Collection Time: 05/08/16  9:45 AM  Result Value Ref Range   Sodium 140 135 - 145 mmol/L   Potassium 3.7 3.5 - 5.1 mmol/L   Chloride 104 101 - 111 mmol/L   CO2 26 22 - 32 mmol/L   Glucose, Bld 108 (H) 65 - 99 mg/dL   BUN 18 6 - 20 mg/dL   Creatinine, Ser 4.09 0.61 - 1.24 mg/dL   Calcium 9.8 8.9 - 81.1 mg/dL   GFR calc non Af Amer >60 >60 mL/min   GFR calc Af Amer >60 >60 mL/min   Anion gap 10 5 - 15  CBC   Collection Time: 05/08/16  9:45 AM  Result Value Ref Range   WBC 8.2 4.0 - 10.5 K/uL   RBC 4.81 4.22 - 5.81 MIL/uL   Hemoglobin 14.1 13.0 - 17.0 g/dL   HCT 91.4 78.2 - 95.6 %   MCV 84.4 78.0 - 100.0 fL   MCH 29.3 26.0 - 34.0 pg   MCHC 34.7 30.0 - 36.0 g/dL   RDW 21.3 08.6 - 57.8 %   Platelets 265 150 - 400 K/uL  Results for orders placed or performed in visit on 02/24/16 (from the past 504 hour(s))  T3, free   Collection Time: 05/18/16 12:50 PM  Result Value Ref Range   T3, Free 2.9 (L) 3.0 - 4.7 pg/mL  T4, free   Collection Time: 05/18/16 12:50 PM  Result Value Ref Range   Free T4 1.0 0.8 - 1.4 ng/dL  TSH   Collection Time: 05/18/16 12:50 PM  Result Value Ref Range   TSH 1.73 0.50 - 4.30 mIU/L  Insulin-like growth factor   Collection Time: 05/18/16 12:50 PM  Result Value Ref Range   IGF-I, LC/MS 79 (L) 108 - 548 ng/mL   Z-Score (Male) -2.9 (L) -2.0 - 2.0 SD  Igf binding protein 3, blood   Collection Time: 05/18/16 12:50 PM  Result Value Ref Range   IGF Binding Protein 3 4.0 3.1 - 7.9 mg/L  Luteinizing hormone   Collection Time: 05/18/16 12:50 PM  Result Value Ref Range   LH 0.6 (L) 1.5 - 9.3 mIU/mL  Follicle stimulating hormone   Collection Time: 05/18/16 12:50 PM  Result Value Ref Range   FSH 1.5 (L) 1.6 - 8.0 mIU/mL  Testosterone Total,Free,Bio, Males   Collection Time: 05/18/16 12:50 PM  Result  Value Ref Range  Testosterone 23 (L) 250 - 827 ng/dL   Albumin 4.2 3.6 - 5.1 g/dL   Sex Hormone Binding 63 (H) 10 - 50 nmol/L   Testosterone, Free 1.6 0.6 - 159.0 pg/mL   Testosterone, Bioavailable 3.1 (L) 44.0 - 417.0 ng/dL  Estradiol   Collection Time: 05/18/16 12:50 PM  Result Value Ref Range   Estradiol <15 <=39 pg/mL   05/24/16: HbA1c 5.4%  05/23/16: As above  05/18/16: As above  02/23/16: Hba1c 5.7%  01/05/16: Urine showed small bilirubin; CMP normal, except for glucose 105 after vomiting; CBC with WBC 11.2;   11/25/15: HbA1c 5.6%  09/26/15: HbA1c 5.8%; IGF-1 110, IGFBP-3 4.0; LH 0.4, FSH 1.9, testosterone 30, free testosterone 3.6, estradiol 14.6; TSH 2.315, TPO antibody <1  06/18/15: Chromosomes 46, XY  06/09/15: HbA1c 5.8%; TSH 1.906, free T4 0.94, free T3 2.8; testosterone 24; IGF-1 110; IGFBP-3 5.0  02/27/15: HbA1c 5.7%; TSH 3.443, free T4 0.74, free T3 3.0; testosterone 29  10/28/14: Testosterone 21, IGF-1 119  08/14/14: TSH 2.835, free T4 0.97, free T3 3.6; LH 0.6, FSH 1.6, testosterone 18, estradiol < 11.8; IGF-1 108, IGFBP-3 3.8  06/18/14: testosterone 27, prolactin 3.8; TSH 3.106, free T4 0.93, free T3 3.0  11/20/13: TSH 0.5, free T4 0.99, free T3 3.4; LH 0.5, FSH 3.1, testosterone 18, estradiol < 11.8  07/15/13: CBC normal; CMP normal;   05/22/13: CMP normal; TSH 2.46, free T4 1.06, free T3 3.3, TPO antibody 11.2; FSH 2.2, LH 0.3, testosterone 63, estradiol 15.6  IMAGING:  Bone age 517/21/17: Bone age was read as 14 years at a chronologic age of 23 years and 7 months. I read the bone age as 34-6.   Bone age 51/23/14: Bone age 9 years at chronologic age 56 years, 10 months.   MRI of head 05/30/13: Normal pituitary gland and brain.   Assessment and Plan:   ASSESSMENT:  1. Puberty delay: By testosterone values, Jontavius's progress through puberty has stopped. However, in the setting of acute illness his gonadotropins and testosterone can decrease. We will need to  repeat his exam and lab tests once this acute GI illness subsides. 2. Growth delay, linear: His height growth seems to have ceased.  3. Overweight: He has lost some fat weight through a combination of eating right and exercising.   4-5. Reflux/Dyspepsia: This problem has worsened even after re-starting omeprazole. I think that he needs to be re-evaluated by GI today.  6-8 Goiter/thyroiditis/hypothyroid:   A. His thyroid gland is still enlarged today. The process of waxing and waning of thyroid gland size is c/w slowly evolving Hashimoto's thyroiditis.  His thyroiditis is clinically quiescent today.   B. His TFTs last November had improved on Synthroid.  His TFTs this month were mid-range normal.  9. Acanthosis: He has acquired acanthosis, due to hyperinsulinemia, which in turn was caused by the resistance to insulin resulting from the excessive production of cytokines by overly fat adipose cells. 10. Hypertension: His SBP is normal, but his DBP is still elevated.  He needs to continue to do more exercise and lose more fat weight.  11. Prediabetes: His HbA1c is lower, c/w his loss of fat weight.     PLAN:   1. Diagnostic: I reviewed current lab results with the family. 2. Therapeutic: I called [facility], but they can't see him today in clinic, but can see him if he goes to the ED at [facility]. Go to [facility] ED. The GI PA is there and can see him.  Order MRI of head, with and without contrast.  3. Patient education: We briefly discussed his other issues, to include puberty delay, growth delay, insulin resistance, hyperinsulinemia, dyspepsia, goiter, and autoimmune thyroid disease at length. We also discussed his improvement clinically and chemically in terms of his puberty delay. The fact that he has entered puberty is a good sign, but we will not know for quite some time how far he will progress through puberty and how much testosterone he will be capable of producing as an adult. Mom and Challen were again  pleased that he is making some pubertal progress on his own.   4. Follow-up: Mom will call me with the results of Donzell's GI evaluation. I weill then re-schedule him for a follow up appointment soon.   Level of Service: This visit lasted in excess of 55 minutes. More than 50% of the visit was devoted to counseling.  [name], MD

## 2016-05-24 NOTE — Progress Notes (Signed)
Patient seen by GI in the ER for nausea and vomiting.  Patient was in the ER yesterday for the same complaints and has received zofran and phenergan today without much improvement in his symptoms.  Recommend admission to try to control symptoms.  GI will enter formal consult note in AM.  Plan tentatively is for EGD 7/26 in the afternoon.  He is allergic to Reglan.  Recommend scheduling Zofran ATC every 6 hours and using phenergan or possibly Ativan prn (? Anxiety related).  UDS positive for THC!!  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

## 2016-05-24 NOTE — ED Notes (Signed)
Admission navigator completed on pt

## 2016-05-24 NOTE — Telephone Encounter (Signed)
The patient had a negative work up thus far. He was seen by [name] in June. She does not have office visits again until 06/06/16. Should I put him with another APP?

## 2016-05-24 NOTE — H&P (Signed)
TRH H&P   Patient Demographics:    Charles Beard, is a 20 y.o. male  MRN: [id number]   DOB - December 08, 1995  Admit Date - 05/24/2016  Outpatient Primary MD for the patient is Nonnie Done., MD  Outpatient Specialists: [facility], Psych Dr [name], endo Dr Fransico Michael  Patient coming from: Home  Chief Complaint  Patient presents with  . Abdominal Pain  . Emesis      HPI:    Charles Beard  is a 20 y.o. male,  With history of delayed puberty syndrome, hypothyroidism, prediabetes follows with Dr. Fransico Michael endocrinology, cyclical vomiting ongoing for the last 2-3 years follows with [facility] history of unremarkable gastric emptying study and upper GI series, ADHD, generalized anxiety, follows with Dr. [name] psychiatrist, comes to the ER from endocrinology office with chief complaints of recurrent nausea vomiting ongoing for the last few days.  He today went to the endocrinology office for his routine follow-up for delayed puberty and hypothyroidism, he was scheduled for an outpatient MRI however he complained of ongoing nausea vomiting and was sent to the ER. In the ER he was also seen by [facility] agent is well known to them, they requested [place] to admit for elective EGD tomorrow.  Patient in the ER appears extremely anxious, he does not appear to be in any distress however, besides nausea vomiting he has some generalized abdominal ache/pain, he is not suicidal homicidal but anxious, denies any blood in his vomitus, denies any diarrhea or constipation, no blood in stool or urine, no unintentional weight loss. No other subjective complaints. Of note his urine drug screen was positive for marijuana and he has history of marijuana  use in the past.    Review of systems:    In addition to the HPI above,   No Fever-chills, No Headache, No changes with Vision or hearing, No problems swallowing food or Liquids, No Chest pain, Cough or Shortness of Breath, No Abdominal pain, +ve Nausea & Vommitting, Bowel movements are regular, No Blood in stool or Urine, No dysuria, No new skin rashes or bruises, No new joints pains-aches,  No new weakness, tingling, numbness in any extremity, No recent weight gain or loss, No polyuria, polydypsia or polyphagia, No significant Mental Stressors.  A full 10 point Review of Systems was done, except as stated above,  all other Review of Systems were negative.   With Past History of the following :    Past Medical History:  Diagnosis Date  . ADHD (attention deficit hyperactivity disorder) 2000  . Anxiety   . Depression   . Headache(784.0)   . Hypothyroidism   . Prediabetes   . Puberty delay       Past Surgical History:  Procedure Laterality Date  . PLANTAR'S WART EXCISION        Social History:     Social History  Substance Use Topics  . Smoking status: Never Smoker  . Smokeless tobacco: Never Used  . Alcohol use No        Family History :     Family History  Problem Relation Age of Onset  . Thyroid disease Mother   . Diabetes Maternal Grandmother     and MGGM  . Heart disease Maternal Grandmother     and MGGM  . Breast cancer Other     and MGGM       Home Medications:   Prior to Admission medications   Medication Sig Start Date End Date Taking? Authorizing Provider  calcium carbonate (OS-CAL) 600 MG TABS tablet Take 600 mg by mouth every evening. Reported on 04/05/2016   Yes Historical Provider, MD  carbamazepine (TEGRETOL XR) 200 MG 12 hr tablet Take 200 mg by mouth 2 (two) times daily. Reported on 04/05/2016   Yes Historical Provider, MD  escitalopram (LEXAPRO) 20 MG tablet Take 20 mg by mouth daily. Reported on 04/05/2016   Yes Historical Provider,  MD  guanFACINE (INTUNIV) 1 MG TB24 Take 1 mg by mouth daily. Reported on 04/05/2016   Yes Historical Provider, MD  loratadine (CLARITIN) 10 MG tablet Take 10 mg by mouth daily as needed for allergies. Reported on 04/05/2016   Yes Historical Provider, MD  Melatonin 10 MG TABS Take 10 mg by mouth at bedtime.   Yes Historical Provider, MD  Multiple Vitamin (MULTIVITAMIN WITH MINERALS) TABS tablet Take 1 tablet by mouth daily.   Yes Historical Provider, MD  omeprazole (PRILOSEC) 40 MG capsule Take 1 capsule (40 mg total) by mouth daily. 05/08/16  Yes Vanetta Mulders, MD  ondansetron (ZOFRAN ODT) 4 MG disintegrating tablet Take 1 tablet (4 mg total) by mouth every 8 (eight) hours as needed for nausea or vomiting. 05/08/16  Yes Vanetta Mulders, MD  promethazine (PHENERGAN) 25 MG tablet Take 1 tablet (25 mg total) by mouth every 8 (eight) hours as needed for nausea or vomiting. 04/05/16  Yes Amy S Esterwood, [name]  SYNTHROID 25 MCG tablet TAKE 1 TABLET BY MOUTH EVERY DAY BEFORE BREAKFAST 05/02/16  Yes [name], MD  LORazepam (ATIVAN) 0.5 MG tablet Take 1 tablet (0.5 mg total) by mouth every 8 (eight) hours as needed for anxiety (nausea). Patient not taking: Reported on 04/05/2016 01/05/16   [name], MD     Allergies:     Allergies  Allergen Reactions  . Reglan [Metoclopramide] Other (See Comments)    Seizure like activity     Physical Exam:   Vitals  Blood pressure 140/90, pulse 72, temperature 97.9 F (36.6 C), temperature source Oral, resp. rate 18, SpO2 92 %.   1. General young white male lying in bed in NAD,    2. Anxious affect and insight, Not Suicidal or Homicidal, Awake Alert, Oriented X 3.  3. No F.N deficits, ALL C.Nerves Intact, Strength 5/5 all 4 extremities, Sensation intact all 4 extremities, Plantars down going.  4. Ears and Eyes appear Normal, Conjunctivae clear, PERRLA. Moist Oral Mucosa.  5. Supple Neck, No JVD, No cervical lymphadenopathy appriciated, No Carotid  Bruits.  6. Symmetrical Chest wall movement, Good air movement bilaterally, CTAB.  7. RRR, No Gallops, Rubs or Murmurs, No Parasternal Heave.  8. Positive Bowel Sounds, Abdomen Soft, No tenderness, No organomegaly appriciated,No rebound -guarding or rigidity.  9.  No Cyanosis, Normal Skin Turgor, No Skin Rash or Bruise.  10. Good muscle tone,  joints appear normal , no effusions, Normal ROM.  11. No Palpable Lymph Nodes in Neck or Axillae      Data Review:    CBC  Recent Labs Lab 05/23/16 1007 05/24/16 1440  WBC 10.5 10.1  HGB 14.8 13.2  HCT 44.0 39.2  PLT 249 242  MCV 88.2 85.4  MCH 29.7 28.8  MCHC 33.6 33.7  RDW 13.1 13.4  LYMPHSABS 2.1 3.2  MONOABS 0.5 0.7  EOSABS 0.0 0.1  BASOSABS 0.0 0.0   ------------------------------------------------------------------------------------------------------------------  Chemistries   Recent Labs Lab 05/23/16 1007 05/24/16 1440  NA 141 142  K 3.9 3.5  CL 104 109  CO2 25 24  GLUCOSE 89 90  BUN 13 9  CREATININE 0.88 0.81  CALCIUM 10.0 9.3  AST 25 19  ALT 25 19  ALKPHOS 116 96  BILITOT 0.7 0.8   ------------------------------------------------------------------------------------------------------------------ estimated creatinine clearance is 133.5 mL/min (by C-G formula based on SCr of 0.81 mg/dL). ------------------------------------------------------------------------------------------------------------------ No results for input(s): TSH, T4TOTAL, T3FREE, THYROIDAB in the last 72 hours.  Invalid input(s): FREET3  Coagulation profile No results for input(s): INR, PROTIME in the last 168 hours. ------------------------------------------------------------------------------------------------------------------- No results for input(s): DDIMER in the last 72 hours. -------------------------------------------------------------------------------------------------------------------  Cardiac Enzymes No results for  input(s): CKMB, TROPONINI, MYOGLOBIN in the last 168 hours.  Invalid input(s): CK ------------------------------------------------------------------------------------------------------------------ No results found for: BNP   ---------------------------------------------------------------------------------------------------------------  Urinalysis    Component Value Date/Time   COLORURINE AMBER (A) 03/28/2016 0832   APPEARANCEUR CLEAR 03/28/2016 0832   LABSPEC 1.029 03/28/2016 0832   PHURINE 6.5 03/28/2016 0832   GLUCOSEU NEGATIVE 03/28/2016 0832   HGBUR NEGATIVE 03/28/2016 0832   BILIRUBINUR SMALL (A) 03/28/2016 0832   KETONESUR NEGATIVE 03/28/2016 0832   PROTEINUR NEGATIVE 03/28/2016 0832   UROBILINOGEN 0.2 09/09/2015 1848   NITRITE NEGATIVE 03/28/2016 0832   LEUKOCYTESUR NEGATIVE 03/28/2016 0832    ----------------------------------------------------------------------------------------------------------------   Imaging Results:    No results found.     Assessment & Plan:      1. Cyclical vomiting syndrome ongoing for a few years. Previous workup was unremarkable which includes gastric emptying study and upper GI series, patient well-known to [facility], he does carry a diagnosis of reflux for which he is on Protonix, will be admitted for 23 our observation, clear liquid diet nothing by mouth after midnight, EGD tomorrow. For now IV Zantac and oral Protonix. Zofran and IV fluids for supportive care. His abdominal exam is highly unremarkable, his electrolytes are stable, I question if this is due to combination of marijuana abuse and underlying anxiety and psych issues.  2. Delayed puberty, prediabetes, hypothyroidism. Patient follows with endocrinologist Dr. Fransico Michael, will check TSH, A1c, he was scheduled for MRI brain which will be ordered. Once discharged he will follow with his endocrinologist.  3. ADHD, anxiety, marijuana use. Continue home medications follow with primary  psychiatrist Dr. Princess Perna. Currently not suicidal homicidal no acute issues.  4. Reflux. PPI continued orally.    DVT Prophylaxis   SCDs  AM Labs Ordered, also please review Full Orders  Family Communication: Admission, patients condition and plan of care including tests being ordered have been discussed with the patient and mother who indicate understanding and agree with the plan and Code Status.  Code Status Full  Likely DC to  Home in am  Condition fair  Consults called: GI  Admission status: Obs   Time spent in minutes : 30   Slayden Mennenga K M.D on 05/24/2016 at 4:59 PM  Between 7am to 7pm - Pager - [phone]. After 7pm go to [email or web address] - password [facility]  [facility] - Office  [phone]

## 2016-05-24 NOTE — Progress Notes (Signed)

## 2016-05-24 NOTE — ED Notes (Signed)
Pt will be going to MRI in about 30 min

## 2016-05-24 NOTE — Telephone Encounter (Signed)
Appointment made for the patient. Communicated through [employer], [place]. She was contacted by the ER before this could be completed. The family is aware of the appointment.

## 2016-05-24 NOTE — ED Provider Notes (Signed)
[facility]-EMERGENCY DEPT Provider Note   CSN: [id number] Arrival date & time: 05/24/16  1122  First Provider Contact:  14:00       History   Chief Complaint Chief Complaint  Patient presents with  . Abdominal Pain  . Emesis    HPI Charles Beard is a 20 y.o. male with a history of hypothyroidism, prediabetes, 6 vomiting syndrome here for evaluation of worsening, moderate, nausea vomiting for the past 3 days. Patient reports the symptoms have been ongoing for the past 3 years. Last evaluated by GI today weeks ago with reported negative endoscopy and gastric emptying studies. States was seen by PCP today, Dr. Fransico Michael and was told to come to the ED for evaluation. Symptoms are not relieved by home Zofran and Phenergan.  HPI  Past Medical History:  Diagnosis Date  . ADHD (attention deficit hyperactivity disorder) 2000  . Anxiety   . Depression   . Headache(784.0)   . Hypothyroidism   . Prediabetes   . Puberty delay     Patient Active Problem List   Diagnosis Date Noted  . Esophageal reflux 05/24/2016  . Elevated hemoglobin A1c 03/12/2015  . Essential hypertension, benign 03/12/2015  . 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 Dyspepsia 11/21/2013  . 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 MDD (major depressive disorder), single episode, severe , no psychosis (HCC) 06/15/2013  . Suicide (HCC) 06/15/2013  . ADHD (attention deficit hyperactivity disorder), combined type 06/15/2013  . GAD (generalized anxiety disorder) 06/15/2013  . Puberty delay 05/23/2013  . Delayed linear growth 05/23/2013  . Overweight peds (BMI 85-94.9 percentile) 05/23/2013  . Goiter 05/23/2013  . Acanthosis nigricans, acquired 05/23/2013  . Gynecomastia, male 05/23/2013    Past Surgical History:  Procedure Laterality Date  . PLANTAR'S WART EXCISION         Home Medications    Prior to Admission medications   Medication Sig Start Date End Date Taking? Authorizing Provider  calcium  carbonate (OS-CAL) 600 MG TABS tablet Take 600 mg by mouth every evening. Reported on 04/05/2016    Historical Provider, MD  carbamazepine (TEGRETOL XR) 200 MG 12 hr tablet Take 200 mg by mouth 2 (two) times daily. Reported on 04/05/2016    Historical Provider, MD  escitalopram (LEXAPRO) 20 MG tablet Take 20 mg by mouth daily. Reported on 04/05/2016    Historical Provider, MD  guanFACINE (INTUNIV) 1 MG TB24 Take 1 mg by mouth daily. Reported on 04/05/2016    Historical Provider, MD  loratadine (CLARITIN) 10 MG tablet Take 10 mg by mouth daily as needed for allergies. Reported on 04/05/2016    Historical Provider, MD  LORazepam (ATIVAN) 0.5 MG tablet Take 1 tablet (0.5 mg total) by mouth every 8 (eight) hours as needed for anxiety (nausea). Patient not taking: Reported on 04/05/2016 01/05/16   [name], MD  Melatonin 10 MG TABS Take 10 mg by mouth at bedtime.    Historical Provider, MD  Multiple Vitamin (MULTIVITAMIN WITH MINERALS) TABS tablet Take 1 tablet by mouth daily.    Historical Provider, MD  omeprazole (PRILOSEC) 40 MG capsule Take 1 capsule (40 mg total) by mouth daily. 05/08/16   Vanetta Mulders, MD  ondansetron (ZOFRAN ODT) 4 MG disintegrating tablet Take 1 tablet (4 mg total) by mouth every 8 (eight) hours as needed for nausea or vomiting. 05/08/16   Vanetta Mulders, MD  promethazine (PHENERGAN) 25 MG tablet Take 1 tablet (25 mg total) by mouth every 8 (eight) hours as needed for nausea or  vomiting. 04/05/16   Amy S Esterwood, [name]  SYNTHROID 25 MCG tablet TAKE 1 TABLET BY MOUTH EVERY DAY BEFORE BREAKFAST 05/02/16   [name], MD    Family History Family History  Problem Relation Age of Onset  . Thyroid disease Mother   . Diabetes Maternal Grandmother     and MGGM  . Heart disease Maternal Grandmother     and MGGM  . Breast cancer Other     and MGGM    Social History Social History  Substance Use Topics  . Smoking status: Never Smoker  . Smokeless tobacco: Never Used  . Alcohol  use No     Allergies   Reglan [metoclopramide]   Review of Systems Review of Systems A 10 point review of systems was completed and was negative except for pertinent positives and negatives as mentioned in the history of present illness    Physical Exam Updated Vital Signs BP 127/90   Pulse (!) 54   Temp 97.9 F (36.6 C) (Oral)   Resp 18   SpO2 100%   Physical Exam  Constitutional: He appears well-developed.  Awake, alert and nontoxic in appearance. Overweight white male. Appears mildly uncomfortable.  HENT:  Head: Normocephalic and atraumatic.  Right Ear: External ear normal.  Left Ear: External ear normal.  Mouth/Throat: Oropharynx is clear and moist.  Eyes: Conjunctivae and EOM are normal. Pupils are equal, round, and reactive to light.  Neck: Normal range of motion. No JVD present.  Cardiovascular: Normal rate, regular rhythm and normal heart sounds.   Pulmonary/Chest: Effort normal and breath sounds normal. No stridor.  Abdominal: Soft.  Diffuse tenderness throughout abdomen, worse in the epigastrium. No rebound or guarding. No peritoneal signs  Musculoskeletal: Normal range of motion.  Neurological:  Awake, alert, cooperative and aware of situation; motor strength bilaterally; sensation normal to light touch bilaterally; no facial asymmetry; tongue midline; major cranial nerves appear intact;  baseline gait without new ataxia.  Skin: No rash noted. He is not diaphoretic.  Psychiatric: He has a normal mood and affect. His behavior is normal. Thought content normal.  Nursing note and vitals reviewed.    ED Treatments / Results  Labs (all labs ordered are listed, but only abnormal results are displayed) Labs Reviewed - No data to display  EKG  EKG Interpretation None       Radiology No results found.  Procedures Procedures (including critical care time)  Medications Ordered in ED Medications - No data to display   Initial Impression / Assessment  and Plan / ED Course  I have reviewed the triage vital signs and the nursing notes.  Pertinent labs & imaging results that were available during my care of the patient were reviewed by me and considered in my medical decision making (see chart for details).  Clinical Course   Vitals:   05/24/16 1604 05/24/16 1628 05/24/16 2006 05/25/16 0645  BP:  140/90 132/89 (!) 134/93  Pulse: 72 72 (!) 50 99  Resp:  18 20 18   Temp:   98.7 F (37.1 C) 97.9 F (36.6 C)  TempSrc:   Oral Oral  SpO2: 92% 92% 100% 100%  Weight:   69 kg 72.5 kg  Height:   5\' 6"  (1.676 m)      Patient with persistent nausea and vomiting, worse in the past 3 days. Sent to ED by endocrinologist who requests GI consultation and MR brain with and without contrast. Discussed with gastroenterology, Doug Sou, [name], will see  in ED. Patient with history of cyclical vomiting syndrome, suspected due to marijuana use. Discussed with patient about marijuana use and he denies any use. Pending UDS. Patient care signed out to oncoming provider,Serena Sam, [name] for follow-up on GI recommendations.  Final Clinical Impressions(s) / ED Diagnoses   Final diagnoses:  Intractable vomiting with nausea, vomiting of unspecified type  Intractable cyclical vomiting with nausea    New Prescriptions New Prescriptions   No medications on file     Joycie Peek, [name] 05/25/16 0704    Samuel Jester, DO 05/27/16 2125

## 2016-05-25 ENCOUNTER — Encounter (HOSPITAL_COMMUNITY): Payer: Self-pay

## 2016-05-25 ENCOUNTER — Observation Stay (HOSPITAL_COMMUNITY): Payer: Medicaid Other | Admitting: Anesthesiology

## 2016-05-25 ENCOUNTER — Encounter (HOSPITAL_COMMUNITY)
Admission: EM | Disposition: A | Discharge status: Home / Self Care | Payer: Self-pay | Source: Home / Self Care | Attending: Internal Medicine

## 2016-05-25 DIAGNOSIS — K219 Gastro-esophageal reflux disease without esophagitis: Secondary | ICD-10-CM | POA: Diagnosis present

## 2016-05-25 DIAGNOSIS — F411 Generalized anxiety disorder: Secondary | ICD-10-CM | POA: Diagnosis present

## 2016-05-25 DIAGNOSIS — E069 Thyroiditis, unspecified: Secondary | ICD-10-CM | POA: Diagnosis present

## 2016-05-25 DIAGNOSIS — G43A1 Cyclical vomiting, intractable: Secondary | ICD-10-CM | POA: Diagnosis present

## 2016-05-25 DIAGNOSIS — R112 Nausea with vomiting, unspecified: Secondary | ICD-10-CM | POA: Diagnosis present

## 2016-05-25 DIAGNOSIS — F3489 Other specified persistent mood disorders: Secondary | ICD-10-CM | POA: Diagnosis not present

## 2016-05-25 DIAGNOSIS — Z888 Allergy status to other drugs, medicaments and biological substances status: Secondary | ICD-10-CM | POA: Diagnosis not present

## 2016-05-25 DIAGNOSIS — F1210 Cannabis abuse, uncomplicated: Secondary | ICD-10-CM | POA: Diagnosis not present

## 2016-05-25 DIAGNOSIS — Z833 Family history of diabetes mellitus: Secondary | ICD-10-CM | POA: Diagnosis not present

## 2016-05-25 DIAGNOSIS — Z8249 Family history of ischemic heart disease and other diseases of the circulatory system: Secondary | ICD-10-CM | POA: Diagnosis not present

## 2016-05-25 DIAGNOSIS — Z79899 Other long term (current) drug therapy: Secondary | ICD-10-CM | POA: Diagnosis not present

## 2016-05-25 DIAGNOSIS — F1290 Cannabis use, unspecified, uncomplicated: Secondary | ICD-10-CM | POA: Diagnosis present

## 2016-05-25 DIAGNOSIS — R7303 Prediabetes: Secondary | ICD-10-CM | POA: Diagnosis present

## 2016-05-25 DIAGNOSIS — F902 Attention-deficit hyperactivity disorder, combined type: Secondary | ICD-10-CM | POA: Diagnosis present

## 2016-05-25 DIAGNOSIS — R079 Chest pain, unspecified: Secondary | ICD-10-CM | POA: Diagnosis not present

## 2016-05-25 DIAGNOSIS — E063 Autoimmune thyroiditis: Secondary | ICD-10-CM

## 2016-05-25 DIAGNOSIS — L83 Acanthosis nigricans: Secondary | ICD-10-CM | POA: Diagnosis present

## 2016-05-25 DIAGNOSIS — E039 Hypothyroidism, unspecified: Secondary | ICD-10-CM | POA: Diagnosis present

## 2016-05-25 DIAGNOSIS — K449 Diaphragmatic hernia without obstruction or gangrene: Secondary | ICD-10-CM | POA: Diagnosis present

## 2016-05-25 DIAGNOSIS — E300 Delayed puberty: Secondary | ICD-10-CM | POA: Diagnosis present

## 2016-05-25 DIAGNOSIS — K319 Disease of stomach and duodenum, unspecified: Secondary | ICD-10-CM | POA: Diagnosis present

## 2016-05-25 DIAGNOSIS — F329 Major depressive disorder, single episode, unspecified: Secondary | ICD-10-CM | POA: Diagnosis present

## 2016-05-25 HISTORY — PX: ESOPHAGOGASTRODUODENOSCOPY (EGD) WITH PROPOFOL: SHX5813

## 2016-05-25 LAB — COMPREHENSIVE METABOLIC PANEL
ALT: 16 U/L — ABNORMAL LOW (ref 17–63)
AST: 16 U/L (ref 15–41)
Albumin: 4.1 g/dL (ref 3.5–5.0)
Alkaline Phosphatase: 88 U/L (ref 38–126)
Anion gap: 4 — ABNORMAL LOW (ref 5–15)
BILIRUBIN TOTAL: 0.6 mg/dL (ref 0.3–1.2)
BUN: 8 mg/dL (ref 6–20)
CHLORIDE: 112 mmol/L — AB (ref 101–111)
CO2: 26 mmol/L (ref 22–32)
Calcium: 9.3 mg/dL (ref 8.9–10.3)
Creatinine, Ser: 0.81 mg/dL (ref 0.61–1.24)
Glucose, Bld: 98 mg/dL (ref 65–99)
POTASSIUM: 4.5 mmol/L (ref 3.5–5.1)
Sodium: 142 mmol/L (ref 135–145)
TOTAL PROTEIN: 6.8 g/dL (ref 6.5–8.1)

## 2016-05-25 LAB — LACTIC ACID, PLASMA
LACTIC ACID, VENOUS: 0.8 mmol/L (ref 0.5–1.9)
Lactic Acid, Venous: 1.6 mmol/L (ref 0.5–1.9)

## 2016-05-25 LAB — TROPONIN I
Troponin I: <0.03 ng/mL (ref <0.03)
Troponin I: <0.03 ng/mL (ref <0.03)

## 2016-05-25 LAB — CBC
HCT: 36.5 % — ABNORMAL LOW (ref 39.0–52.0)
HEMOGLOBIN: 12.2 g/dL — AB (ref 13.0–17.0)
MCH: 29.1 pg (ref 26.0–34.0)
MCHC: 33.4 g/dL (ref 30.0–36.0)
MCV: 87.1 fL (ref 78.0–100.0)
Platelets: 223 10*3/uL (ref 150–400)
RBC: 4.19 MIL/uL — ABNORMAL LOW (ref 4.22–5.81)
RDW: 13.3 % (ref 11.5–15.5)
WBC: 10.4 10*3/uL (ref 4.0–10.5)

## 2016-05-25 LAB — PROTIME-INR
INR: 1.23
PROTHROMBIN TIME: 15.2 s (ref 11.4–15.2)

## 2016-05-25 LAB — TSH: TSH: 1.561 u[IU]/mL (ref 0.350–4.500)

## 2016-05-25 SURGERY — ESOPHAGOGASTRODUODENOSCOPY (EGD) WITH PROPOFOL
Anesthesia: Monitor Anesthesia Care

## 2016-05-25 MED ORDER — PROPOFOL 10 MG/ML IV BOLUS
INTRAVENOUS | Status: AC
  Filled 2016-05-25: qty 20

## 2016-05-25 MED ORDER — PANTOPRAZOLE SODIUM 40 MG PO TBEC
40.0000 mg | DELAYED_RELEASE_TABLET | Freq: Every day | ORAL | Status: DC
  Filled 2016-05-25: qty 1

## 2016-05-25 MED ORDER — OXYCODONE-ACETAMINOPHEN 5-325 MG PO TABS: 2.0000 | ORAL_TABLET | Freq: Once | ORAL | Status: DC

## 2016-05-25 MED ORDER — ONDANSETRON HCL 4 MG/2ML IJ SOLN
INTRAMUSCULAR | Status: DC | PRN
  Administered 2016-05-25: 4 mg via INTRAVENOUS

## 2016-05-25 MED ORDER — PROPOFOL 10 MG/ML IV BOLUS
INTRAVENOUS | Status: DC | PRN
  Administered 2016-05-25: 30 mg via INTRAVENOUS
  Administered 2016-05-25: 10 mg via INTRAVENOUS
  Administered 2016-05-25 (×2): 30 mg via INTRAVENOUS

## 2016-05-25 MED ORDER — ONDANSETRON HCL 4 MG/2ML IJ SOLN
INTRAMUSCULAR | Status: AC
  Filled 2016-05-25: qty 4

## 2016-05-25 MED ORDER — LACTATED RINGERS IV SOLN
INTRAVENOUS | Status: DC | PRN
  Administered 2016-05-25: 12:00:00 via INTRAVENOUS

## 2016-05-25 MED ORDER — LIDOCAINE HCL (CARDIAC) 20 MG/ML IV SOLN
INTRAVENOUS | Status: DC | PRN
  Administered 2016-05-25: 60 mg via INTRAVENOUS

## 2016-05-25 MED ORDER — LIDOCAINE HCL (CARDIAC) 20 MG/ML IV SOLN
INTRAVENOUS | Status: AC
  Filled 2016-05-25: qty 5

## 2016-05-25 MED ORDER — KETOROLAC TROMETHAMINE 15 MG/ML IJ SOLN
15.0000 mg | Freq: Once | INTRAMUSCULAR | Status: AC
  Administered 2016-05-25: 15 mg via INTRAVENOUS
  Filled 2016-05-25: qty 1

## 2016-05-25 MED ORDER — DIPHENHYDRAMINE HCL 50 MG/ML IJ SOLN
25.0000 mg | Freq: Once | INTRAMUSCULAR | Status: AC
  Administered 2016-05-25: 25 mg via INTRAVENOUS
  Filled 2016-05-25: qty 1

## 2016-05-25 MED ORDER — SODIUM CHLORIDE 0.9 % IV SOLN
INTRAVENOUS | Status: DC
  Administered 2016-05-25 – 2016-05-26 (×2): via INTRAVENOUS

## 2016-05-25 MED ORDER — PROPOFOL 10 MG/ML IV BOLUS
INTRAVENOUS | Status: AC
  Filled 2016-05-25: qty 40

## 2016-05-25 MED ORDER — CAPSAICIN 0.025 % EX CREA
TOPICAL_CREAM | Freq: Two times a day (BID) | CUTANEOUS | Status: DC
  Administered 2016-05-25: 22:00:00 via TOPICAL
  Filled 2016-05-25: qty 60

## 2016-05-25 MED ORDER — PROMETHAZINE HCL 25 MG/ML IJ SOLN
12.5000 mg | Freq: Once | INTRAMUSCULAR | Status: AC
  Administered 2016-05-25: 12.5 mg via INTRAVENOUS
  Filled 2016-05-25: qty 1

## 2016-05-25 MED ORDER — KETOROLAC TROMETHAMINE 30 MG/ML IJ SOLN
30.0000 mg | Freq: Once | INTRAMUSCULAR | Status: AC
  Administered 2016-05-25: 30 mg via INTRAVENOUS
  Filled 2016-05-25: qty 1

## 2016-05-25 MED ORDER — PROPOFOL 500 MG/50ML IV EMUL
INTRAVENOUS | Status: DC | PRN
  Administered 2016-05-25: 150 ug/kg/min via INTRAVENOUS

## 2016-05-25 MED ORDER — LORAZEPAM 2 MG/ML IJ SOLN
0.5000 mg | Freq: Once | INTRAMUSCULAR | Status: AC
  Administered 2016-05-25: 0.5 mg via INTRAVENOUS
  Filled 2016-05-25: qty 1

## 2016-05-25 MED ORDER — LORAZEPAM 0.5 MG PO TABS: 0.5000 mg | ORAL_TABLET | Freq: Once | ORAL | Status: DC

## 2016-05-25 SURGICAL SUPPLY — 15 items

## 2016-05-25 NOTE — Anesthesia Preprocedure Evaluation (Signed)
Anesthesia Evaluation  Patient identified by MRN, date of birth, ID band Patient awake    Reviewed: Allergy & Precautions, NPO status , Patient's Chart, lab work & pertinent test results  Airway Mallampati: II  TM Distance: >3 FB Neck ROM: Full    Dental no notable dental hx. (+) Teeth Intact   Pulmonary neg pulmonary ROS,    Pulmonary exam normal breath sounds clear to auscultation       Cardiovascular Normal cardiovascular exam Rhythm:Regular Rate:Normal     Neuro/Psych  Headaches, PSYCHIATRIC DISORDERS Anxiety Depression ADHD   GI/Hepatic Neg liver ROS, GERD  Medicated,Nausea and Vomiting   Endo/Other  Hypothyroidism Elevated HbA1c Autoimmune thyroiditis  Renal/GU negative Renal ROS  negative genitourinary   Musculoskeletal negative musculoskeletal ROS (+)   Abdominal Normal abdominal exam  (+)   Peds Delayed puberty   Hematology  (+) anemia ,   Anesthesia Other Findings   Reproductive/Obstetrics                             Anesthesia Physical Anesthesia Plan  ASA: II  Anesthesia Plan: MAC   Post-op Pain Management:    Induction: Intravenous  Airway Management Planned: Natural Airway, Nasal Cannula and Simple Face Mask  Additional Equipment:   Intra-op Plan:   Post-operative Plan:   Informed Consent: I have reviewed the patients History and Physical, chart, labs and discussed the procedure including the risks, benefits and alternatives for the proposed anesthesia with the patient or authorized representative who has indicated his/her understanding and acceptance.   Dental advisory given  Plan Discussed with: CRNA, Anesthesiologist and Surgeon  Anesthesia Plan Comments:         Anesthesia Quick Evaluation

## 2016-05-25 NOTE — Consult Note (Signed)
Referring Provider: EDP Primary Care Physician:  Nonnie Done., MD Primary Gastroenterologist:  Dr. Adela Lank  Reason for Consultation:  Nausea and vomiting  HPI: Charles Beard is a 20 y.o. male with history of delayed puberty syndrome, hypothyroidism, prediabetes follows with Dr. Fransico Michael endocrinology.  Also has cyclical vomiting ongoing for the last 2-3 years, but only recently seen by GI in our office.  Underwent gastric emptying study and upper GI series along with CT scan of the abdomen that have all been unremarkable.  He also has ADHD, generalized anxiety, depression for which he follows with a psychiatrist and is on multiple psychotropic medications.  He comes to the ER on this occasion from endocrinology office with chief complaints of recurrent nausea and vomiting, ongoing for the last few days.  Was actually in the ER here for the same reasons yesterday.  He saw his endocrinologist for his routine follow-up for delayed puberty and hypothyroidism yesterday, he was scheduled for an outpatient MRI, however, he complained of ongoing nausea and vomiting and was sent to the ER.  He was also told that he would be seen by GI if he went to the ER.  Patient in the ER he appears extremely anxious.  Complains of ongoing nausea and dry heaves despite zofran and phenergan.  Says that Zofran does not help at all and phenergan helps for a short time until it wears off then symptoms return again.  Of note his urine drug screen was positive for marijuana and he has history of marijuana use in the past.  MRI head was normal.  CT scan abdomen and pelvis, GES, and UGI all unremarkable except for moderate reflux on UGI study.   Past Medical History:  Diagnosis Date  . ADHD (attention deficit hyperactivity disorder) 2000  . Anxiety   . Depression   . Headache(784.0)   . Hypothyroidism   . Prediabetes   . Puberty delay     Past Surgical History:  Procedure Laterality Date  . PLANTAR'S WART  EXCISION      Prior to Admission medications   Medication Sig Start Date End Date Taking? Authorizing Provider  calcium carbonate (OS-CAL) 600 MG TABS tablet Take 600 mg by mouth every evening. Reported on 04/05/2016   Yes Historical Provider, MD  carbamazepine (TEGRETOL XR) 200 MG 12 hr tablet Take 200 mg by mouth 2 (two) times daily. Reported on 04/05/2016   Yes Historical Provider, MD  escitalopram (LEXAPRO) 20 MG tablet Take 20 mg by mouth daily. Reported on 04/05/2016   Yes Historical Provider, MD  guanFACINE (INTUNIV) 1 MG TB24 Take 1 mg by mouth daily. Reported on 04/05/2016   Yes Historical Provider, MD  loratadine (CLARITIN) 10 MG tablet Take 10 mg by mouth daily as needed for allergies. Reported on 04/05/2016   Yes Historical Provider, MD  Melatonin 10 MG TABS Take 10 mg by mouth at bedtime.   Yes Historical Provider, MD  Multiple Vitamin (MULTIVITAMIN WITH MINERALS) TABS tablet Take 1 tablet by mouth daily.   Yes Historical Provider, MD  omeprazole (PRILOSEC) 40 MG capsule Take 1 capsule (40 mg total) by mouth daily. 05/08/16  Yes Vanetta Mulders, MD  ondansetron (ZOFRAN ODT) 4 MG disintegrating tablet Take 1 tablet (4 mg total) by mouth every 8 (eight) hours as needed for nausea or vomiting. 05/08/16  Yes Vanetta Mulders, MD  promethazine (PHENERGAN) 25 MG tablet Take 1 tablet (25 mg total) by mouth every 8 (eight) hours as needed for nausea or vomiting.  04/05/16  Yes Amy S Esterwood, [name]  SYNTHROID 25 MCG tablet TAKE 1 TABLET BY MOUTH EVERY DAY BEFORE BREAKFAST 05/02/16  Yes [name], MD  LORazepam (ATIVAN) 0.5 MG tablet Take 1 tablet (0.5 mg total) by mouth every 8 (eight) hours as needed for anxiety (nausea). Patient not taking: Reported on 04/05/2016 01/05/16   [name], MD    Current Facility-Administered Medications  Medication Dose Route Frequency Provider Last Rate Last Dose  . 0.9 % NaCl with KCl 40 mEq / L  infusion   Intravenous Continuous Leroy Sea, MD 75 mL/hr at  05/24/16 2126 75 mL/hr at 05/24/16 2126  . acetaminophen (TYLENOL) tablet 650 mg  650 mg Oral Q6H PRN Leroy Sea, MD       Or  . acetaminophen (TYLENOL) suppository 650 mg  650 mg Rectal Q6H PRN Leroy Sea, MD      . calcium carbonate (OS-CAL - dosed in mg of elemental calcium) tablet 1,250 mg  1,250 mg Oral QPM Leroy Sea, MD   1,250 mg at 05/24/16 2132  . carbamazepine (TEGRETOL XR) 12 hr tablet 200 mg  200 mg Oral BID Leroy Sea, MD   200 mg at 05/24/16 2133  . escitalopram (LEXAPRO) tablet 20 mg  20 mg Oral Daily Leroy Sea, MD   20 mg at 05/24/16 2133  . famotidine (PEPCID) IVPB 20 mg premix  20 mg Intravenous Q12H Leroy Sea, MD   20 mg at 05/24/16 2126  . guanFACINE (INTUNIV) SR tablet 1 mg  1 mg Oral Daily Leroy Sea, MD   1 mg at 05/24/16 2133  . levothyroxine (SYNTHROID, LEVOTHROID) tablet 25 mcg  25 mcg Oral QAC breakfast Leroy Sea, MD      . loratadine (CLARITIN) tablet 10 mg  10 mg Oral Daily PRN Leroy Sea, MD      . LORazepam (ATIVAN) tablet 0.5 mg  0.5 mg Oral Q8H PRN Leroy Sea, MD   0.5 mg at 05/24/16 2133  . LORazepam (ATIVAN) tablet 0.5 mg  0.5 mg Oral Once Leanne Chang, NP      . multivitamin with minerals tablet 1 tablet  1 tablet Oral Daily Leroy Sea, MD      . ondansetron (ZOFRAN) 8 mg in sodium chloride 0.9 % 50 mL IVPB  8 mg Intravenous Q6H PRN Leroy Sea, MD      . pantoprazole (PROTONIX) EC tablet 40 mg  40 mg Oral BID WC Leroy Sea, MD   40 mg at 05/24/16 1925  . promethazine (PHENERGAN) tablet 25 mg  25 mg Oral Q8H PRN Leroy Sea, MD   25 mg at 05/24/16 2344    Allergies as of 05/24/2016 - Review Complete 05/24/2016  Allergen Reaction Noted  . Reglan [metoclopramide] Other (See Comments) 03/28/2016    Family History  Problem Relation Age of Onset  . Thyroid disease Mother   . Diabetes Maternal Grandmother     and MGGM  . Heart disease Maternal Grandmother     and  MGGM  . Breast cancer Other     and MGGM    Social History   Social History  . Marital status: Single    Spouse name: N/A  . Number of children: 0  . Years of education: N/A   Occupational History  . Not on file.   Social History Main Topics  . Smoking status: Never Smoker  . Smokeless  tobacco: Never Used  . Alcohol use No  . Drug use: No  . Sexual activity: No     Comment: father smokes   Other Topics Concern  . Not on file   Social History Narrative   Lives at home with mom and 2 cats, visits dad every other weekend, will attend [employer], will start 9th grade in the fall.    Review of Systems: Ten point ROS is O/W negative except as mentioned in HPI.  Physical Exam: Vital signs in last 24 hours: Temp:  [97.9 F (36.6 C)-98.7 F (37.1 C)] 97.9 F (36.6 C) (07/26 0645) Pulse Rate:  [50-99] 99 (07/26 0645) Resp:  [18-20] 18 (07/26 0645) BP: (120-144)/(80-96) 134/93 (07/26 0645) SpO2:  [92 %-100 %] 100 % (07/26 0645) Weight:  [149 lb (67.6 kg)-159 lb 13.3 oz (72.5 kg)] 159 lb 13.3 oz (72.5 kg) (07/26 0645) Last BM Date: 05/24/16 General:  Alert, Well-developed, well-nourished, pleasant and cooperative in NAD Head:  Normocephalic and atraumatic. Eyes:  Sclera clear, no icterus.  Conjunctiva pink. Ears:  Normal auditory acuity. Mouth:  No deformity or lesions.   Lungs:  Clear throughout to auscultation.  No wheezes, crackles, or rhonchi.  Heart:  Regular rate and rhythm; no murmurs, clicks, rubs, or gallops. Abdomen:  Soft, non-distended.  BS present.  Mild epigastric TTP. Rectal:  Deferred  Msk:  Symmetrical without gross deformities. Pulses:  Normal pulses noted. Extremities:  Without clubbing or edema. Neurologic:  Alert and oriented x 4;  grossly normal neurologically. Skin:  Intact without significant lesions or rashes. Psych:  Alert and cooperative. Normal mood and affect.  Intake/Output from previous day: 07/25 0701 - 07/26 0700 In:  713.8 [I.V.:663.8; IV Piggyback:50] Out: 200 [Urine:200]  Lab Results:  Recent Labs  05/23/16 1007 05/24/16 1440 05/25/16 0406  WBC 10.5 10.1 10.4  HGB 14.8 13.2 12.2*  HCT 44.0 39.2 36.5*  PLT 249 242 223   BMET  Recent Labs  05/23/16 1007 05/24/16 1440 05/25/16 0406  NA 141 142 142  K 3.9 3.5 4.5  CL 104 109 112*  CO2 GLUCOSE 89 90 98  BUN CREATININE 0.88 0.81 0.81  CALCIUM 10.0 9.3 9.3   LFT  Recent Labs  05/25/16 0406  PROT 6.8  ALBUMIN 4.1  AST 16  ALT 16*  ALKPHOS 88  BILITOT 0.6   PT/INR  Recent Labs  05/25/16 0406  LABPROT 15.2  INR 1.23   Studies/Results: Mr Laqueta Jean NU Contrast  Result Date: 05/24/2016 CLINICAL DATA:  Nausea vomiting.  Anxiety. EXAM: MRI HEAD WITHOUT AND WITH CONTRAST TECHNIQUE: Multiplanar, multiecho pulse sequences of the brain and surrounding structures were obtained without and with intravenous contrast. CONTRAST:  14mL MULTIHANCE GADOBENATE DIMEGLUMINE 529 MG/ML IV SOLN COMPARISON:  CT 07/29/2013.  MRI 05/30/2013 FINDINGS: Ventricle size normal.  Cerebral volume normal. Negative for acute or chronic infarction. Negative for demyelinating disease. Cerebral white matter normal. Basal ganglia and brainstem normal. Cerebellum normal. Negative for intracranial hemorrhage.  No fluid collection. Negative for mass or edema.  No shift of the midline structures. Postcontrast imaging demonstrates normal enhancement. No enhancing mass lesion. Leptomeningeal enhancement normal. Normal venous enhancement. Pituitary normal in size. Normal orbital structures. Paranasal sinuses clear. Normal calvarium. IMPRESSION: Normal MRI of the brain with contrast. Electronically Signed   By: Marlan Palau M.D.   On: 05/24/2016 19:27  IMPRESSION/PLAN:  #1 20 yo WM With 2-3 year history of recurrent episodes  of nausea and vomiting at times lasting for days with numerous ER visits at outside facilities and no definite diagnosis. He may have a  cyclic vomiting syndrome, rule out psychogenic/anxiety related. Doubt underlying GI pathology but cannot rule out.  Drug screen also positive for THC raising possibility of cannibis abuse causing recurrent nausea and vomiting.  Will perform EGD today. Rule out severe GERD, rule out peptic ulcer disease, etc. #2 significant history of severe anxiety and depression, on multiple psychotropics #3 delayed puberty #4 borderline IQ #5 ADHD #6 hypothyroid   ZEHR, JESSICA D.  05/25/2016, 9:17 AM  Pager number [phone]

## 2016-05-25 NOTE — Transfer of Care (Signed)
Immediate Anesthesia Transfer of Care Note  Patient: Charles Beard  Procedure(s) Performed: Procedure(s): ESOPHAGOGASTRODUODENOSCOPY (EGD) WITH PROPOFOL (N/A)  Patient Location: PACU  Anesthesia Type:MAC  Level of Consciousness:  sedated, patient cooperative and responds to stimulation  Airway & Oxygen Therapy:Patient Spontanous Breathing and Patient connected to face mask oxgen  Post-op Assessment:  Report given to PACU RN and Post -op Vital signs reviewed and stable  Post vital signs:  Reviewed and stable  Last Vitals:  Vitals:   05/25/16 0645 05/25/16 1143  BP: (!) 134/93 123/74  Pulse: 99 (!) 56  Resp: 18 13  Temp: 36.6 C 36.7 C    Complications: No apparent anesthesia complications

## 2016-05-25 NOTE — Op Note (Signed)
[facility] Patient Name: Charles Beard Procedure Date: 05/25/2016 MRN: [id number] Attending MD: Beverley Fiedler , MD Date of Birth: 1996/05/26 CSN: [id number] Age: 20 Admit Type: Inpatient Procedure:                Upper GI endoscopy Indications:              Persistent vomiting of unknown cause Providers:                Carie Caddy. Rhea Belton, MD, Jacquiline Doe, RN, Lorenda Ishihara,                            Technician, Doreene Burke, CRNA Referring MD:              Medicines:                Monitored Anesthesia Care Complications:            No immediate complications. Estimated Blood Loss:     Estimated blood loss was minimal. Procedure:                Pre-Anesthesia Assessment:                           - Prior to the procedure, a History and Physical                            was performed, and patient medications and                            allergies were reviewed. The patient's tolerance of                            previous anesthesia was also reviewed. The risks                            and benefits of the procedure and the sedation                            options and risks were discussed with the patient.                            All questions were answered, and informed consent                            was obtained. Prior Anticoagulants: The patient has                            taken no previous anticoagulant or antiplatelet                            agents. ASA Grade Assessment: II - A patient with                            mild systemic disease. After reviewing the risks  and benefits, the patient was deemed in                            satisfactory condition to undergo the procedure.                           After obtaining informed consent, the endoscope was                            passed under direct vision. Throughout the                            procedure, the patient's blood pressure, pulse, and        oxygen saturations were monitored continuously. The                            [id number] ([id number]) scope was introduced through the                            mouth, and advanced to the second part of duodenum.                            The upper GI endoscopy was accomplished without                            difficulty. The patient tolerated the procedure                            well. Scope In: Scope Out: Findings:      The examined esophagus was normal.      A small hiatal hernia was present.      A few localized, small non-bleeding erosions were found in the       prepyloric region of the stomach. Biopsies were taken with a cold       forceps for histology and Helicobacter pylori testing.      The examined duodenum was normal. Biopsies for histology were taken with       a cold forceps for evaluation of celiac disease. Impression:               - Normal esophagus.                           - Small hiatal hernia.                           - Non-bleeding erosive gastropathy. Biopsied.                           - Normal examined duodenum. Biopsied. Moderate Sedation:      N/A Recommendation:           - Return patient to hospital ward for ongoing care.                           - Clear liquid diet. Advanced diet as tolerated.                           -  Continue present medications. As needed                            anti-emetics during attacks. Consider addition of                            amitriptyline 0.5 mg/kg, but will defer this to his                            neuropsychiatrist.                           - Completely avoid recreational marijuana use as                            this can precipitate and exacerbate cyclic vomiting                            syndrome.                           - Await pathology results. Procedure Code(s):        --- Professional ---                           [phone], Esophagogastroduodenoscopy, flexible,                             transoral; with biopsy, single or multiple Diagnosis Code(s):        --- Professional ---                           K44.9, Diaphragmatic hernia without obstruction or                            gangrene                           K31.89, Other diseases of stomach and duodenum                           R11.10, Vomiting, unspecified CPT copyright 2016 [employer] Association. All rights reserved. The codes documented in this report are preliminary and upon coder review may  be revised to meet current compliance requirements. Beverley Fiedler, MD 05/25/2016 1:15:57 PM This report has been signed electronically. Number of Addenda: 0

## 2016-05-25 NOTE — Progress Notes (Signed)
Report called to [facility] on 4th floor.  Patient stable from AM assessment.  Patient taken down to EGD.  Will be taken to 4th floor after procedure.

## 2016-05-25 NOTE — Anesthesia Postprocedure Evaluation (Signed)
Anesthesia Post Note  Patient: Charles Beard  Procedure(s) Performed: Procedure(s) (LRB): ESOPHAGOGASTRODUODENOSCOPY (EGD) WITH PROPOFOL (N/A)  Patient location during evaluation: PACU Anesthesia Type: MAC Level of consciousness: awake and alert and oriented Pain management: pain level controlled Vital Signs Assessment: post-procedure vital signs reviewed and stable Respiratory status: spontaneous breathing, nonlabored ventilation and respiratory function stable Cardiovascular status: stable and blood pressure returned to baseline Anesthetic complications: no    Last Vitals:  Vitals:   05/25/16 1330 05/25/16 1332  BP: 122/61   Pulse: (!) 44 (!) 49  Resp: 17 15  Temp:      Last Pain:  Vitals:   05/25/16 1143  TempSrc: Oral  PainSc: 7                  Jeri Jeanbaptiste A.

## 2016-05-25 NOTE — Progress Notes (Signed)
Called to return to patient room, pt "nauseated" and "vomited" clear liquid, not pill noted. Will continue to monitor patient. SRP, RN

## 2016-05-25 NOTE — Progress Notes (Signed)
Received pt from Endo stable alert and talking at interval with mother at bedside. will Cont. monitor. SRP, RN

## 2016-05-25 NOTE — Progress Notes (Signed)
During handoff report, pt pleased with pain relief. Toradol effective in controlling pain. Patient finally settled down and resting with mother at bedside. Pain level less than  3. SRP, RN

## 2016-05-25 NOTE — Progress Notes (Signed)
Patient and mother refusing treatment for canibus induced cyclic nausea and vomiting.  Demanding IV pain meds as well as IV phenergan.  Seen by GI.  Work up complete.  Needs to stop using illegal drugs.

## 2016-05-25 NOTE — Progress Notes (Signed)
Pt refused shower. SRP, RN

## 2016-05-25 NOTE — Progress Notes (Signed)
Called MD concerning Abd pain---IV Toradol 15 mg ordered, crying and dubbing over in pain. SRP, RN

## 2016-05-25 NOTE — Progress Notes (Signed)
PROGRESS NOTE    Charles Beard  QMV:[id number] DOB: 11/21/1995 DOA: 05/24/2016 PCP: Nonnie Done., MD   Outpatient Specialists: Dr. Fransico Michael    Brief Narrative:    Assessment & Plan:   Active Problems:   Acanthosis nigricans, acquired   ADHD (attention deficit hyperactivity disorder), combined type   GAD (generalized anxiety disorder)   Thyroiditis, autoimmune   Esophageal reflux   Nausea & vomiting   Intractable nausea and vomiting   Cyclical vomiting with nausea   Cyclical vomiting syndrome ongoing for a few years.  -? Anorexia-- previous charts show patient was obese and wanted to lose weight? -Previous workup was unremarkable which includes gastric emptying study and upper GI series, patient well-known to [facility], he does carry a diagnosis of reflux for which he is on Protonix, -clear liquid diet  -nothing by mouth after midnight -EGD  ? If due to combination of marijuana abuse and underlying anxiety and psych issues.  Delayed puberty, prediabetes, hypothyroidism. Patient follows with endocrinologist Dr. Fransico Michael, will check TSH, A1c, he was scheduled for MRI brain which will be ordered. Once discharged he will follow with his endocrinologist.  ADHD, anxiety, marijuana use. Continue home medications follow with primary psychiatrist Dr. Princess Perna. Currently not suicidal homicidal no acute issues.   Reflux. PPI continued orally.  Chest pain -suspect GI related tx to tele -cycle CE  DVT prophylaxis:  SCD's  Code Status: Full Code   Family Communication: Mother at bedside  Disposition Plan:     Consultants:   GI  Procedures:       Subjective: C/o to nurse of chest pain-- center of chest  Objective: Vitals:   05/24/16 1604 05/24/16 1628 05/24/16 2006 05/25/16 0645  BP:  140/90 132/89 (!) 134/93  Pulse: 72 72 (!) 50 99  Resp:  18 20 18   Temp:   98.7 F (37.1 C) 97.9 F (36.6 C)  TempSrc:   Oral Oral  SpO2: 92% 92% 100% 100%    Weight:   69 kg (152 lb 1.9 oz) 72.5 kg (159 lb 13.3 oz)  Height:   5\' 6"  (1.676 m)     Intake/Output Summary (Last 24 hours) at 05/25/16 1135 Last data filed at 05/25/16 0617  Gross per 24 hour  Intake           713.75 ml  Output              200 ml  Net           513.75 ml   Filed Weights   05/24/16 2006 05/25/16 0645  Weight: 69 kg (152 lb 1.9 oz) 72.5 kg (159 lb 13.3 oz)    Examination:  General exam: Appears calm and comfortable  Respiratory system: Clear to auscultation. Respiratory effort normal. Cardiovascular system: S1 & S2 heard, RRR. No JVD, murmurs, rubs, gallops or clicks. No pedal edema. Gastrointestinal system: Abdomen is nondistended, soft and nontender. No organomegaly or masses felt. Normal bowel sounds heard. Central nervous system: Alert and oriented. No focal neurological deficits.     Data Reviewed: I have personally reviewed following labs and imaging studies  CBC:  Recent Labs Lab 05/23/16 1007 05/24/16 1440 05/25/16 0406  WBC 10.5 10.1 10.4  NEUTROABS 7.9* 6.2  --   HGB 14.8 13.2 12.2*  HCT 44.0 39.2 36.5*  MCV 88.2 85.4 87.1  PLT 249 242 223   Basic Metabolic Panel:  Recent Labs Lab 05/23/16 1007 05/24/16 1440 05/25/16 0406  NA 141 142 142  K 3.9 3.5 4.5  CL 104 109 112*  CO2 GLUCOSE 89 90 98  BUN CREATININE 0.88 0.81 0.81  CALCIUM 10.0 9.3 9.3   GFR: Estimated Creatinine Clearance: 133.5 mL/min (by C-G formula based on SCr of 0.81 mg/dL). Liver Function Tests:  Recent Labs Lab 05/18/16 1250 05/23/16 1007 05/24/16 1440 05/25/16 0406  AST  --  ALT  --  25 19 16*  ALKPHOS  --  116 96 88  BILITOT  --  0.7 0.8 0.6  PROT  --  8.7* 7.3 6.8  ALBUMIN 4.2 5.1* 4.5 4.1    Recent Labs Lab 05/23/16 1007 05/24/16 1440  LIPASE 12 11   No results for input(s): AMMONIA in the last 168 hours. Coagulation Profile:  Recent Labs Lab 05/25/16 0406  INR 1.23   Cardiac Enzymes: No results for  input(s): CKTOTAL, CKMB, CKMBINDEX, TROPONINI in the last 168 hours. BNP (last 3 results) No results for input(s): PROBNP in the last 8760 hours. HbA1C:  Recent Labs  05/24/16 1035  HGBA1C 5.4   CBG: No results for input(s): GLUCAP in the last 168 hours. Lipid Profile: No results for input(s): CHOL, HDL, LDLCALC, TRIG, CHOLHDL, LDLDIRECT in the last 72 hours. Thyroid Function Tests:  Recent Labs  05/25/16 0406  TSH 1.561   Anemia Panel: No results for input(s): VITAMINB12, FOLATE, FERRITIN, TIBC, IRON, RETICCTPCT in the last 72 hours. Urine analysis:    Component Value Date/Time   COLORURINE AMBER (A) 03/28/2016 0832   APPEARANCEUR CLEAR 03/28/2016 0832   LABSPEC 1.029 03/28/2016 0832   PHURINE 6.5 03/28/2016 0832   GLUCOSEU NEGATIVE 03/28/2016 0832   HGBUR NEGATIVE 03/28/2016 0832   BILIRUBINUR SMALL (A) 03/28/2016 0832   KETONESUR NEGATIVE 03/28/2016 0832   PROTEINUR NEGATIVE 03/28/2016 0832   UROBILINOGEN 0.2 09/09/2015 1848   NITRITE NEGATIVE 03/28/2016 0832   LEUKOCYTESUR NEGATIVE 03/28/2016 0832     )No results found for this or any previous visit (from the past 240 hour(s)).    Anti-infectives    None       Radiology Studies: Mr Laqueta Jean ZO Contrast  Result Date: 05/24/2016 CLINICAL DATA:  Nausea vomiting.  Anxiety. EXAM: MRI HEAD WITHOUT AND WITH CONTRAST TECHNIQUE: Multiplanar, multiecho pulse sequences of the brain and surrounding structures were obtained without and with intravenous contrast. CONTRAST:  14mL MULTIHANCE GADOBENATE DIMEGLUMINE 529 MG/ML IV SOLN COMPARISON:  CT 07/29/2013.  MRI 05/30/2013 FINDINGS: Ventricle size normal.  Cerebral volume normal. Negative for acute or chronic infarction. Negative for demyelinating disease. Cerebral white matter normal. Basal ganglia and brainstem normal. Cerebellum normal. Negative for intracranial hemorrhage.  No fluid collection. Negative for mass or edema.  No shift of the midline structures. Postcontrast  imaging demonstrates normal enhancement. No enhancing mass lesion. Leptomeningeal enhancement normal. Normal venous enhancement. Pituitary normal in size. Normal orbital structures. Paranasal sinuses clear. Normal calvarium. IMPRESSION: Normal MRI of the brain with contrast. Electronically Signed   By: Marlan Palau M.D.   On: 05/24/2016 19:27       Scheduled Meds: . [MAR Hold] calcium carbonate  1,250 mg Oral QPM  . [MAR Hold] carbamazepine  200 mg Oral BID  . [MAR Hold] escitalopram  20 mg Oral Daily  . [MAR Hold] famotidine (PEPCID) IV  20 mg Intravenous Q12H  . [MAR Hold] guanFACINE  1 mg Oral Daily  . [MAR Hold] levothyroxine  25 mcg Oral QAC breakfast  . [MAR Hold]  LORazepam  0.5 mg Oral Once  . [MAR Hold] multivitamin with minerals  1 tablet Oral Daily  . [MAR Hold] pantoprazole  40 mg Oral BID WC   Continuous Infusions: . 0.9 % NaCl with KCl 40 mEq / L 75 mL/hr (05/24/16 2126)     LOS: 0 days    Time spent: 25 min    Lorel Lembo Juanetta Gosling, DO [facility] Pager [phone]  If 7PM-7AM, please contact night-coverage [email or web address] Password [facility] 05/25/2016, 11:35 AM

## 2016-05-25 NOTE — Progress Notes (Signed)
Standing at pt bedside coaching him to deep breath, refused to decrease nausea. Oral pain med given as ordered. SRP, RN

## 2016-05-26 ENCOUNTER — Encounter (HOSPITAL_COMMUNITY): Payer: Self-pay | Admitting: Internal Medicine

## 2016-05-26 DIAGNOSIS — F1210 Cannabis abuse, uncomplicated: Secondary | ICD-10-CM

## 2016-05-26 DIAGNOSIS — F3489 Other specified persistent mood disorders: Secondary | ICD-10-CM

## 2016-05-26 DIAGNOSIS — F902 Attention-deficit hyperactivity disorder, combined type: Secondary | ICD-10-CM

## 2016-05-26 LAB — CBC
HEMATOCRIT: 36 % — AB (ref 39.0–52.0)
Hemoglobin: 12.1 g/dL — ABNORMAL LOW (ref 13.0–17.0)
MCH: 29.2 pg (ref 26.0–34.0)
MCHC: 33.6 g/dL (ref 30.0–36.0)
MCV: 87 fL (ref 78.0–100.0)
Platelets: 225 10*3/uL (ref 150–400)
RBC: 4.14 MIL/uL — ABNORMAL LOW (ref 4.22–5.81)
RDW: 13.2 % (ref 11.5–15.5)
WBC: 8 10*3/uL (ref 4.0–10.5)

## 2016-05-26 LAB — BASIC METABOLIC PANEL
Anion gap: 6 (ref 5–15)
BUN: 11 mg/dL (ref 6–20)
CHLORIDE: 108 mmol/L (ref 101–111)
CO2: 26 mmol/L (ref 22–32)
Calcium: 9.1 mg/dL (ref 8.9–10.3)
Creatinine, Ser: 0.87 mg/dL (ref 0.61–1.24)
GFR calc Af Amer: >60 mL/min (ref >60)
GLUCOSE: 79 mg/dL (ref 65–99)
POTASSIUM: 3.8 mmol/L (ref 3.5–5.1)
Sodium: 140 mmol/L (ref 135–145)

## 2016-05-26 LAB — CREATININE, URINE, RANDOM: Creatinine, Urine: 121.9 mg/dL

## 2016-05-26 LAB — HEMOGLOBIN A1C
Hgb A1c MFr Bld: 5.3 % (ref 4.8–5.6)
MEAN PLASMA GLUCOSE: 105 mg/dL

## 2016-05-26 MED ORDER — ONDANSETRON HCL 4 MG/2ML IJ SOLN
4.0000 mg | Freq: Once | INTRAMUSCULAR | Status: AC | PRN
  Administered 2016-05-26: 4 mg via INTRAVENOUS
  Filled 2016-05-26: qty 2

## 2016-05-26 MED ORDER — AMITRIPTYLINE HCL 25 MG PO TABS
25.0000 mg | ORAL_TABLET | Freq: Every day | ORAL | Status: DC
  Administered 2016-05-26: 25 mg via ORAL
  Filled 2016-05-26: qty 1

## 2016-05-26 MED ORDER — FAMOTIDINE IN NACL 20-0.9 MG/50ML-% IV SOLN
20.0000 mg | Freq: Two times a day (BID) | INTRAVENOUS | Status: DC
  Administered 2016-05-26 – 2016-05-27 (×3): 20 mg via INTRAVENOUS
  Filled 2016-05-26 (×3): qty 50

## 2016-05-26 MED ORDER — LORAZEPAM 2 MG/ML IJ SOLN
0.5000 mg | Freq: Four times a day (QID) | INTRAMUSCULAR | Status: DC | PRN
  Administered 2016-05-26 – 2016-05-27 (×3): 0.5 mg via INTRAVENOUS
  Filled 2016-05-26 (×3): qty 1

## 2016-05-26 NOTE — Clinical Social Work Note (Addendum)
Clinical Social Work Assessment  Patient Details  Name: Charles Beard MRN: [id number] Date of Birth: 06-May-1996  Date of referral:  05/26/16               Reason for consult:  Substance Use                Permission sought to share information with:  Case Manager Permission granted to share information::     Name::        Agency::     Relationship::Mother: Charles Beard     Contact Information:   [phone]  Housing/Transportation Living arrangements for the past 2 months:  [place] of Information:  Patient, Parent Patient Interpreter Needed:  None Criminal Activity/Legal Involvement Pertinent to Current Situation/Hospitalization:  No - Comment as needed Significant Relationships:  Parents Lives with:  Parents Do you feel safe going back to the place where you live?  Yes Need for family participation in patient care:  Yes (Comment)  Care giving concerns:  Patient has been vomiting and has severe nausea. Psych consulted for medication management. LCSWA consulted for patient marijuana use. Provided patient with resources for outpatient therapy.   Social Worker assessment / plan:  Insurance account manager  Met with patient and mother at bedside explained role and reason for consult. Patient seemed to be in pain and did not say much during assessment. Patient mother expressed patient has ADHD, Depression and Anxiety. She reports patient sees Dr. Darleene Cleaver for treatment and medications. She reports patient has had difficulty in school and has recently started online highschool courses. Patient recently started smoking marijuana. Patient reports he understands that it can affect his health. Patient mother reports she has talked to him about it as well. Patient was receptive to outpatient resources, unsure if he will follow up.   Plan: Provided patient with SA outpatient resources.  Employment status:  Other (Comment) (In [employer]) Insurance information:  Medicaid [facility] [place] [facility]  Recommendations:  Not assessed at this time Information / Referral to community resources:  Outpatient Substance Abuse Treatment Options  Patient/Family's Response to care:  Agreeable  Patient/Family's Understanding of and Emotional Response to Diagnosis, Current Treatment, and Prognosis: Patient mother is well versed in his care. Patient understands that smoking Marijuana and taking medications can affect his health. Patient open to outpatient resources at this time.     Emotional Assessment Appearance:  Appears younger than stated age (Puberty Delay) Attitude/Demeanor/Rapport:    Affect (typically observed):  Irritable Orientation:  Oriented to Self, Oriented to Place, Oriented to  Time, Oriented to Situation Alcohol / Substance use:  Illicit Drugs Psych involvement (Current and /or in the community):  Yes (Comment)  Discharge Needs  Concerns to be addressed:  No discharge needs identified Readmission within the last 30 days:  Yes Current discharge risk:  None Barriers to Discharge:  No Barriers Identified   Lia Hopping, LCSW 05/26/2016, 3:33 PM

## 2016-05-26 NOTE — Consult Note (Signed)
[facility] Consult   Reason for Consult:  ADHD, depression and anxiety Referring Physician:  Dr. Eliseo Squires Patient Identification: Charles Beard MRN:  [id number] Principal Diagnosis: <principal problem not specified> Diagnosis:   Patient Active Problem List   Diagnosis Date Noted  . Esophageal reflux [K21.9] 05/24/2016  . Nausea & vomiting [R11.2] 05/24/2016  . Intractable nausea and vomiting [R11.10] 05/24/2016  . Cyclical vomiting with nausea [G43.A0] 05/24/2016  . Elevated hemoglobin A1c [R73.09] 03/12/2015  . Essential hypertension, benign [I10] 03/12/2015  . 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 Dyspepsia [R10.13] 11/21/2013  . 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 MDD (major depressive disorder), single episode, severe , no psychosis ([place]) [F32.2] 06/15/2013  . Suicide ([place]) [place].[id number]] 06/15/2013  . ADHD (attention deficit hyperactivity disorder), combined type [F90.2] 06/15/2013  . GAD (generalized anxiety disorder) [F41.1] 06/15/2013  . Puberty delay [E30.0] 05/23/2013  . Delayed linear growth [R62.52] 05/23/2013  . Overweight peds (BMI 85-94.9 percentile) [E66.3, Z68.53] 05/23/2013  . Goiter [E04.9] 05/23/2013  . Acanthosis nigricans, acquired [L83] 05/23/2013  . Gynecomastia, male [N62] 05/23/2013    Total Time spent with patient: 1 hour  Subjective:   Charles Beard is a 20 y.o. male patient admitted with recurrent nausea aqnd vomiting.  HPI:  Charles Beard  is a 20 y.o. male, Seen, chart reviewed for this face-to-face psychiatric consultation and evaluation of increased symptoms of nausea and cyclical vomiting. Patient has been diagnosed with attention deficit hyperactive disorder, generalized anxiety disorder and mood swings. Patient has substance abuse history. Psychiatric consultation requested to consider amitriptyline for cyclical vomiting and he also has been taking Tegretol, guanfacine and Lexapro. Patient mother reported he  has been taking the same medication for the last 3 years from neuropsychiatry. Patient is not taking his methylphenidate as it is a summer vacation time from school. Patient denies current symptoms of depression, mood swings, racing thoughts, suicidal/homicidal ideation, intention or plans. Patient has no evidence of psychosis. Patient is index is positive for cannabis.  Medical history: Patient is also suffering with multiple medical problems, with history of delayed puberty syndrome, hypothyroidism, prediabetes follows with Dr. Tobe Sos endocrinology, cyclical vomiting ongoing for the last 2-3 years follows with [facility] history of unremarkable gastric emptying study and upper GI series, comes to the ER from endocrinology office with chief complaints of recurrent nausea vomiting ongoing for the last few days. In the ER he was also seen by [facility] agent is well known to them, they requested [place] to admit for elective EGD tomorrow.  Past Psychiatric History: ADHD, generalized anxiety, follows with Dr. Darleene Cleaver psychiatrist.   Risk to Self: Is patient at risk for suicide?: No Risk to Others:   Prior Inpatient Therapy:   Prior Outpatient Therapy:    Past Medical History:  Past Medical History:  Diagnosis Date  . ADHD (attention deficit hyperactivity disorder) 2000  . Anxiety   . Depression   . Headache(784.0)   . Hypothyroidism   . Prediabetes   . Puberty delay     Past Surgical History:  Procedure Laterality Date  . ESOPHAGOGASTRODUODENOSCOPY (EGD) WITH PROPOFOL N/A 05/25/2016   Procedure: ESOPHAGOGASTRODUODENOSCOPY (EGD) WITH PROPOFOL;  Surgeon: Jerene Bears, MD;  Location: [facility];  Service: Endoscopy;  Laterality: N/A;  . PLANTAR'S WART EXCISION     Family History:  Family History  Problem Relation Age of Onset  . Thyroid disease Mother   . Diabetes Maternal Grandmother     and MGGM  . Heart  disease Maternal Grandmother     and MGGM  . Breast cancer Other     and MGGM    Family Psychiatric  History: Unknown family history of mental illness Social History:  History  Alcohol Use No     History  Drug Use No    Social History   Social History  . Marital status: Single    Spouse name: N/A  . Number of children: 0  . Years of education: N/A   Social History Main Topics  . Smoking status: Never Smoker  . Smokeless tobacco: Never Used  . Alcohol use No  . Drug use: No  . Sexual activity: No     Comment: father smokes   Other Topics Concern  . None   Social History Narrative   Lives at home with mom and 2 cats, visits dad every other weekend, will attend [employer], will start 9th grade in the fall.   Additional Social History: Patient mother reported that he has been considered for special education and received individual education plan since ninth grade. Reportedly he repeated ninth grade 3 times.     Allergies:   Allergies  Allergen Reactions  . Reglan [Metoclopramide] Other (See Comments)    Seizure like activity    Labs:  Results for orders placed or performed during the hospital encounter of 05/24/16 (from the past 48 hour(s))  Hemoglobin A1c     Status: None   Collection Time: 05/24/16  2:39 PM  Result Value Ref Range   Hgb A1c MFr Bld 5.3 4.8 - 5.6 %    Comment: (NOTE)         Pre-diabetes: 5.7 - 6.4         Diabetes: >6.4         Glycemic control for adults with diabetes: <7.0    Mean Plasma Glucose 105 mg/dL    Comment: (NOTE) Performed At: [facility] [address] [place], [place] [id number] Lindon Romp MD IP:[id number]   TSH     Status: None   Collection Time: 05/24/16  2:39 PM  Result Value Ref Range   TSH 1.055 0.350 - 4.500 uIU/mL  Comprehensive metabolic panel     Status: None   Collection Time: 05/24/16  2:40 PM  Result Value Ref Range   Sodium 142 135 - 145 mmol/L   Potassium 3.5 3.5 - 5.1 mmol/L   Chloride 109 101 - 111 mmol/L   CO2 24 22 - 32 mmol/L   Glucose, Bld 90 65 -  99 mg/dL   BUN 9 6 - 20 mg/dL   Creatinine, Ser 0.81 0.61 - 1.24 mg/dL   Calcium 9.3 8.9 - 10.3 mg/dL   Total Protein 7.3 6.5 - 8.1 g/dL   Albumin 4.5 3.5 - 5.0 g/dL   AST 19 15 - 41 U/L   ALT 19 17 - 63 U/L   Alkaline Phosphatase 96 38 - 126 U/L   Total Bilirubin 0.8 0.3 - 1.2 mg/dL   GFR calc non Af Amer >60 >60 mL/min   GFR calc Af Amer >60 >60 mL/min    Comment: (NOTE) The eGFR has been calculated using the CKD EPI equation. This calculation has not been validated in all clinical situations. eGFR's persistently <60 mL/min signify possible Chronic Kidney Disease.    Anion gap 9 5 - 15  CBC with Differential     Status: None   Collection Time: 05/24/16  2:40 PM  Result Value Ref Range  WBC 10.1 4.0 - 10.5 K/uL   RBC 4.59 4.22 - 5.81 MIL/uL   Hemoglobin 13.2 13.0 - 17.0 g/dL   HCT 39.2 39.0 - 52.0 %   MCV 85.4 78.0 - 100.0 fL   MCH 28.8 26.0 - 34.0 pg   MCHC 33.7 30.0 - 36.0 g/dL   RDW 13.4 11.5 - 15.5 %   Platelets 242 150 - 400 K/uL   Neutrophils Relative % 61 %   Neutro Abs 6.2 1.7 - 7.7 K/uL   Lymphocytes Relative 32 %   Lymphs Abs 3.2 0.7 - 4.0 K/uL   Monocytes Relative 6 %   Monocytes Absolute 0.7 0.1 - 1.0 K/uL   Eosinophils Relative 1 %   Eosinophils Absolute 0.1 0.0 - 0.7 K/uL   Basophils Relative 0 %   Basophils Absolute 0.0 0.0 - 0.1 K/uL  Lipase, blood     Status: None   Collection Time: 05/24/16  2:40 PM  Result Value Ref Range   Lipase 11 11 - 51 U/L  Urine rapid drug screen (hosp performed)     Status: Abnormal   Collection Time: 05/24/16  3:40 PM  Result Value Ref Range   Opiates NONE DETECTED NONE DETECTED   Cocaine NONE DETECTED NONE DETECTED   Benzodiazepines NONE DETECTED NONE DETECTED   Amphetamines NONE DETECTED NONE DETECTED   Tetrahydrocannabinol POSITIVE (A) NONE DETECTED   Barbiturates NONE DETECTED NONE DETECTED    Comment:        DRUG SCREEN FOR MEDICAL PURPOSES ONLY.  IF CONFIRMATION IS NEEDED FOR ANY PURPOSE, NOTIFY LAB WITHIN  5 DAYS.        LOWEST DETECTABLE LIMITS FOR URINE DRUG SCREEN Drug Class       Cutoff (ng/mL) Amphetamine      1000 Barbiturate      200 Benzodiazepine   628 Tricyclics       315 Opiates          300 Cocaine          300 THC              50   CBC     Status: Abnormal   Collection Time: 05/25/16  4:06 AM  Result Value Ref Range   WBC 10.4 4.0 - 10.5 K/uL   RBC 4.19 (L) 4.22 - 5.81 MIL/uL   Hemoglobin 12.2 (L) 13.0 - 17.0 g/dL   HCT 36.5 (L) 39.0 - 52.0 %   MCV 87.1 78.0 - 100.0 fL   MCH 29.1 26.0 - 34.0 pg   MCHC 33.4 30.0 - 36.0 g/dL   RDW 13.3 11.5 - 15.5 %   Platelets 223 150 - 400 K/uL  Comprehensive metabolic panel     Status: Abnormal   Collection Time: 05/25/16  4:06 AM  Result Value Ref Range   Sodium 142 135 - 145 mmol/L   Potassium 4.5 3.5 - 5.1 mmol/L    Comment: DELTA CHECK NOTED NO VISIBLE HEMOLYSIS    Chloride 112 (H) 101 - 111 mmol/L   CO2 26 22 - 32 mmol/L   Glucose, Bld 98 65 - 99 mg/dL   BUN 8 6 - 20 mg/dL   Creatinine, Ser 0.81 0.61 - 1.24 mg/dL   Calcium 9.3 8.9 - 10.3 mg/dL   Total Protein 6.8 6.5 - 8.1 g/dL   Albumin 4.1 3.5 - 5.0 g/dL   AST 16 15 - 41 U/L   ALT 16 (L) 17 - 63 U/L   Alkaline Phosphatase 88 38 -  126 U/L   Total Bilirubin 0.6 0.3 - 1.2 mg/dL   GFR calc non Af Amer >60 >60 mL/min   GFR calc Af Amer >60 >60 mL/min    Comment: (NOTE) The eGFR has been calculated using the CKD EPI equation. This calculation has not been validated in all clinical situations. eGFR's persistently <60 mL/min signify possible Chronic Kidney Disease.    Anion gap 4 (L) 5 - 15  Protime-INR     Status: None   Collection Time: 05/25/16  4:06 AM  Result Value Ref Range   Prothrombin Time 15.2 11.4 - 15.2 seconds   INR 1.23   TSH     Status: None   Collection Time: 05/25/16  4:06 AM  Result Value Ref Range   TSH 1.561 0.350 - 4.500 uIU/mL  Troponin I (q 6hr x 3)     Status: None   Collection Time: 05/25/16 11:11 AM  Result Value Ref Range    Troponin I <0.03 <0.03 ng/mL  Troponin I (q 6hr x 3)     Status: None   Collection Time: 05/25/16  3:39 PM  Result Value Ref Range   Troponin I <0.03 <0.03 ng/mL  Lactic acid, plasma     Status: None   Collection Time: 05/25/16  5:36 PM  Result Value Ref Range   Lactic Acid, Venous 1.6 0.5 - 1.9 mmol/L  Troponin I (q 6hr x 3)     Status: None   Collection Time: 05/25/16  9:44 PM  Result Value Ref Range   Troponin I <0.03 <0.03 ng/mL  Lactic acid, plasma     Status: None   Collection Time: 05/25/16  9:44 PM  Result Value Ref Range   Lactic Acid, Venous 0.8 0.5 - 1.9 mmol/L  CBC     Status: Abnormal   Collection Time: 05/26/16  5:16 AM  Result Value Ref Range   WBC 8.0 4.0 - 10.5 K/uL   RBC 4.14 (L) 4.22 - 5.81 MIL/uL   Hemoglobin 12.1 (L) 13.0 - 17.0 g/dL   HCT 36.0 (L) 39.0 - 52.0 %   MCV 87.0 78.0 - 100.0 fL   MCH 29.2 26.0 - 34.0 pg   MCHC 33.6 30.0 - 36.0 g/dL   RDW 13.2 11.5 - 15.5 %   Platelets 225 150 - 400 K/uL  Basic metabolic panel     Status: None   Collection Time: 05/26/16  5:16 AM  Result Value Ref Range   Sodium 140 135 - 145 mmol/L   Potassium 3.8 3.5 - 5.1 mmol/L   Chloride 108 101 - 111 mmol/L   CO2 26 22 - 32 mmol/L   Glucose, Bld 79 65 - 99 mg/dL   BUN 11 6 - 20 mg/dL   Creatinine, Ser 0.87 0.61 - 1.24 mg/dL   Calcium 9.1 8.9 - 10.3 mg/dL   GFR calc non Af Amer >60 >60 mL/min   GFR calc Af Amer >60 >60 mL/min    Comment: (NOTE) The eGFR has been calculated using the CKD EPI equation. This calculation has not been validated in all clinical situations. eGFR's persistently <60 mL/min signify possible Chronic Kidney Disease.    Anion gap 6 5 - 15    Current Facility-Administered Medications  Medication Dose Route Frequency Provider Last Rate Last Dose  . acetaminophen (TYLENOL) tablet 650 mg  650 mg Oral Q6H PRN Thurnell Lose, MD       Or  . acetaminophen (TYLENOL) suppository 650 mg  650 mg  Rectal Q6H PRN Thurnell Lose, MD      . calcium  carbonate (OS-CAL - dosed in mg of elemental calcium) tablet 1,250 mg  1,250 mg Oral QPM Thurnell Lose, MD   1,250 mg at 05/24/16 2132  . carbamazepine (TEGRETOL XR) 12 hr tablet 200 mg  200 mg Oral BID Thurnell Lose, MD   200 mg at 05/25/16 2137  . escitalopram (LEXAPRO) tablet 20 mg  20 mg Oral Daily Thurnell Lose, MD   20 mg at 05/24/16 2133  . famotidine (PEPCID) IVPB 20 mg premix  20 mg Intravenous Q12H Jessica U Vann, DO      . guanFACINE (INTUNIV) SR tablet 1 mg  1 mg Oral Daily Thurnell Lose, MD   1 mg at 05/24/16 2133  . levothyroxine (SYNTHROID, LEVOTHROID) tablet 25 mcg  25 mcg Oral QAC breakfast Thurnell Lose, MD      . loratadine (CLARITIN) tablet 10 mg  10 mg Oral Daily PRN Thurnell Lose, MD      . LORazepam (ATIVAN) injection 0.5 mg  0.5 mg Intravenous Q6H PRN Geradine Girt, DO      . LORazepam (ATIVAN) tablet 0.5 mg  0.5 mg Oral Once Jeryl Columbia, NP      . multivitamin with minerals tablet 1 tablet  1 tablet Oral Daily Thurnell Lose, MD      . ondansetron (ZOFRAN) 8 mg in sodium chloride 0.9 % 50 mL IVPB  8 mg Intravenous Q6H PRN Thurnell Lose, MD   8 mg at 05/25/16 0941  . ondansetron (ZOFRAN) injection 4 mg  4 mg Intravenous Once PRN Josephine Igo, MD      . pantoprazole (PROTONIX) EC tablet 40 mg  40 mg Oral QAC breakfast Jerene Bears, MD      . promethazine (PHENERGAN) tablet 25 mg  25 mg Oral Q8H PRN Thurnell Lose, MD   25 mg at 05/26/16 2202    Musculoskeletal: Strength & Muscle Tone: decreased Gait & Station: unable to stand Patient leans: N/A  Psychiatric Specialty Exam: Physical Exam  Constitutional: He appears well-developed.  HENT:  Head: Normocephalic.  Eyes: Pupils are equal, round, and reactive to light.  Neck: Normal range of motion.  Cardiovascular: Normal rate.   Respiratory: Effort normal.  GI: There is tenderness. There is guarding.  Musculoskeletal: Normal range of motion.  Neurological: He is alert.   Psychiatric: His mood appears anxious. His speech is delayed. He is agitated. Cognition and memory are impaired. He expresses impulsivity. He exhibits a depressed mood.    Review of Systems  Gastrointestinal: Positive for heartburn and nausea.  Neurological: Positive for weakness.  Psychiatric/Behavioral: Positive for depression and substance abuse. The patient is nervous/anxious and has insomnia.     Blood pressure 128/76, pulse (!) 133, temperature 97.8 F (36.6 C), temperature source Oral, resp. rate 16, height '5\' 6"'$  (1.676 m), weight 69.2 kg (152 lb 8 oz), SpO2 100 %.Body mass index is 24.61 kg/m.  General Appearance: Bizarre and Guarded, Does not appear to be comfortable in his bed   Eye Contact:  Minimal  Speech:  Slow  Volume:  Decreased  Mood:  Anxious and Depressed  Affect:  Constricted and Depressed  Thought Process:  Coherent  Orientation:  Full (Time, Place, and Person)  Thought Content:  Logical and Rumination  Suicidal Thoughts:  No  Homicidal Thoughts:  No  Memory:  Immediate;   Fair Recent;   Fair  Judgement:  Poor  Insight:  Fair  Psychomotor Activity:  Decreased  Concentration:  Concentration: Fair and Attention Span: Fair  Recall:  Good  Fund of Knowledge:  Fair  Language:  Good  Akathisia:  Negative  Handed:  Right  AIMS (if indicated):     Assets:  Communication Skills Desire for Improvement Financial Resources/Insurance Housing Leisure Time Resilience Social Support Transportation Vocational/Educational  ADL's:  Impaired  Cognition:  Impaired,  Mild  Sleep:        Treatment Plan Summary: Patient has been suffering with multiple medical problems and psychiatric problems presented with the cyclical vomiting. Patient denies current symptoms of first suicidal/homicidal ideation and has no evidence of psychosis. Patient appeared very uncomfortable in his bed continuously moving in his bed restlessly.  Daily contact with patient to assess and  evaluate symptoms and progress in treatment and Medication management   Attention deficit hyperactivity disorder: Continue guanfacine  Continue carbamazepine extended release 200 mg twice daily for mood swings and agitation, will check serum therapeutic levels tomorrow morning  Depression: Continue Lexapro 20 mg daily  Cyclical vomiting: Medical causes was ruled out with the administration by gastroenterology and believed it is psychological and also suggested amitriptyline. Will start admitted and 25 mg at bedtime and review of EKGs indicated QTC level is within normal limits except mild bradycardia  Appreciate psychiatric consultation and follow up as clinically required Please contact [phone] or [phone] if needs further assistance  Plan: Patient does not meet criteria for psychiatric inpatient admission. Supportive therapy provided about ongoing stressors.  Ambrose Finland, MD 05/26/2016 10:46 AM

## 2016-05-26 NOTE — Progress Notes (Addendum)
PROGRESS NOTE    Charles Beard  ZOX:[id number] DOB: [birth year] DOA: 05/24/2016 PCP: Nonnie Done., MD   Outpatient Specialists: Dr. Fransico Michael    Brief Narrative:    Assessment & Plan:   Active Problems:   Acanthosis nigricans, acquired   ADHD (attention deficit hyperactivity disorder), combined type   GAD (generalized anxiety disorder)   Thyroiditis, autoimmune   Esophageal reflux   Nausea & vomiting   Intractable nausea and vomiting   Cyclical vomiting with nausea   Cyclical vomiting syndrome ongoing for a few years.  -? Anorexia-- previous charts show patient was obese and wanted to lose weight? -Previous workup was unremarkable which includes gastric emptying study and upper GI series, patient well-known to [facility], he does carry a diagnosis of reflux for which he is on Protonix, -full liquid diet  -EGD- biopsy pending ? If due to combination of marijuana abuse and underlying anxiety and psych issues. - GI suggested: Consider addition of  amitriptyline 0.5 mg/kg, but will defer this to psychiatrist. -psych consult -agreeable to take hot shower IV ativan -will r/o AIP- urine studies ordered  Delayed puberty, prediabetes, hypothyroidism. Patient follows with endocrinologist Dr. Fransico Michael, will check TSH, A1c, he was scheduled for MRI brain which will be ordered. Once discharged he will follow with his endocrinologist.  ADHD, anxiety, marijuana use. Continue home medications follow with primary psychiatrist Dr. Princess Perna. Currently not suicidal homicidal    Reflux.  -change to IV  Chest pain -suspect GI related tx to tele -cycle CE- negative  DVT prophylaxis:  SCD's  Code Status: Full Code   Family Communication: Mother at bedside  Disposition Plan:     Consultants:   GI  Procedures:   EGD    Subjective: Continues to "spit up"  Objective: Vitals:   05/25/16 1332 05/25/16 1400 05/25/16 2101 05/26/16 0521  BP:  123/78 121/81 128/76    Pulse: (!) 49 (!) 107 (!) 51 (!) 133  Resp: 15 16 16 16   Temp:  98.3 F (36.8 C) 98.5 F (36.9 C) 97.8 F (36.6 C)  TempSrc:  Oral Oral Oral  SpO2: 99% 97% 100% 100%  Weight:    69.2 kg (152 lb 8 oz)  Height:        Intake/Output Summary (Last 24 hours) at 05/26/16 1117 Last data filed at 05/26/16 0936  Gross per 24 hour  Intake          1497.75 ml  Output              400 ml  Net          1097.75 ml   Filed Weights   05/24/16 2006 05/25/16 0645 05/26/16 0521  Weight: 69 kg (152 lb 1.9 oz) 72.5 kg (159 lb 13.3 oz) 69.2 kg (152 lb 8 oz)    Examination:  General exam: Appears calm and comfortable the begins to retch Respiratory system: Clear to auscultation. Respiratory effort normal. Cardiovascular system: S1 & S2 heard, RRR. No JVD, murmurs, rubs, gallops or clicks. No pedal edema. Gastrointestinal system: Abdomen is nondistended, soft and nontender. No organomegaly or masses felt. Normal bowel sounds heard. Central nervous system: Alert and oriented. No focal neurological deficits.     Data Reviewed: I have personally reviewed following labs and imaging studies  CBC:  Recent Labs Lab 05/23/16 1007 05/24/16 1440 05/25/16 0406 05/26/16 0516  WBC 10.5 10.1 10.4 8.0  NEUTROABS 7.9* 6.2  --   --   HGB 14.8 13.2 12.2* 12.1*  HCT 44.0 39.2 36.5* 36.0*  MCV 88.2 85.4 87.1 87.0  PLT 249 242 223 225   Basic Metabolic Panel:  Recent Labs Lab 05/23/16 1007 05/24/16 1440 05/25/16 0406 05/26/16 0516  NA 141 142 142 140  K 3.9 3.5 4.5 3.8  CL 104 109 112* 108  CO2 GLUCOSE 89 90 98 79  BUN CREATININE 0.88 0.81 0.81 0.87  CALCIUM 10.0 9.3 9.3 9.1   GFR: Estimated Creatinine Clearance: 124.3 mL/min (by C-G formula based on SCr of 0.87 mg/dL). Liver Function Tests:  Recent Labs Lab 05/23/16 1007 05/24/16 1440 05/25/16 0406  AST ALT 25 19 16*  ALKPHOS 116 96 88  BILITOT 0.7 0.8 0.6  PROT 8.7* 7.3 6.8  ALBUMIN 5.1* 4.5  4.1    Recent Labs Lab 05/23/16 1007 05/24/16 1440  LIPASE 12 11   No results for input(s): AMMONIA in the last 168 hours. Coagulation Profile:  Recent Labs Lab 05/25/16 0406  INR 1.23   Cardiac Enzymes:  Recent Labs Lab 05/25/16 1111 05/25/16 1539 05/25/16 2144  TROPONINI <0.03 <0.03 <0.03   BNP (last 3 results) No results for input(s): PROBNP in the last 8760 hours. HbA1C:  Recent Labs  05/24/16 1035 05/24/16 1439  HGBA1C 5.4 5.3   CBG: No results for input(s): GLUCAP in the last 168 hours. Lipid Profile: No results for input(s): CHOL, HDL, LDLCALC, TRIG, CHOLHDL, LDLDIRECT in the last 72 hours. Thyroid Function Tests:  Recent Labs  05/25/16 0406  TSH 1.561   Anemia Panel: No results for input(s): VITAMINB12, FOLATE, FERRITIN, TIBC, IRON, RETICCTPCT in the last 72 hours. Urine analysis:    Component Value Date/Time   COLORURINE AMBER (A) 03/28/2016 0832   APPEARANCEUR CLEAR 03/28/2016 0832   LABSPEC 1.029 03/28/2016 0832   PHURINE 6.5 03/28/2016 0832   GLUCOSEU NEGATIVE 03/28/2016 0832   HGBUR NEGATIVE 03/28/2016 0832   BILIRUBINUR SMALL (A) 03/28/2016 0832   KETONESUR NEGATIVE 03/28/2016 0832   PROTEINUR NEGATIVE 03/28/2016 0832   UROBILINOGEN 0.2 09/09/2015 1848   NITRITE NEGATIVE 03/28/2016 0832   LEUKOCYTESUR NEGATIVE 03/28/2016 0832     )No results found for this or any previous visit (from the past 240 hour(s)).    Anti-infectives    None       Radiology Studies: Mr Laqueta Jean ZO Contrast  Result Date: 05/24/2016 CLINICAL DATA:  Nausea vomiting.  Anxiety. EXAM: MRI HEAD WITHOUT AND WITH CONTRAST TECHNIQUE: Multiplanar, multiecho pulse sequences of the brain and surrounding structures were obtained without and with intravenous contrast. CONTRAST:  14mL MULTIHANCE GADOBENATE DIMEGLUMINE 529 MG/ML IV SOLN COMPARISON:  CT 07/29/2013.  MRI 05/30/2013 FINDINGS: Ventricle size normal.  Cerebral volume normal. Negative for acute or chronic  infarction. Negative for demyelinating disease. Cerebral white matter normal. Basal ganglia and brainstem normal. Cerebellum normal. Negative for intracranial hemorrhage.  No fluid collection. Negative for mass or edema.  No shift of the midline structures. Postcontrast imaging demonstrates normal enhancement. No enhancing mass lesion. Leptomeningeal enhancement normal. Normal venous enhancement. Pituitary normal in size. Normal orbital structures. Paranasal sinuses clear. Normal calvarium. IMPRESSION: Normal MRI of the brain with contrast. Electronically Signed   By: Marlan Palau M.D.   On: 05/24/2016 19:27       Scheduled Meds: . calcium carbonate  1,250 mg Oral QPM  . carbamazepine  200 mg Oral BID  . escitalopram  20 mg Oral Daily  . famotidine (PEPCID) IV  20 mg Intravenous Q12H  . guanFACINE  1 mg Oral Daily  . levothyroxine  25 mcg Oral QAC breakfast  . LORazepam  0.5 mg Oral Once  . multivitamin with minerals  1 tablet Oral Daily  . pantoprazole  40 mg Oral QAC breakfast   Continuous Infusions:     LOS: 1 day    Time spent: 25 min    Vartan Kerins Juanetta Gosling, DO [facility] Pager [phone]  If 7PM-7AM, please contact night-coverage [email or web address] Password [facility] 05/26/2016, 11:17 AM

## 2016-05-26 NOTE — Progress Notes (Signed)
Pt c/o nausea and gagging.  PO phenergan given.  Pt threw up pill.  I verified pill in with clear emesis.  IV Zofran ordered, will administer and continue to monitor.  Pt unable to take other morning medications due to nausea.

## 2016-05-27 ENCOUNTER — Encounter: Payer: Self-pay | Admitting: Internal Medicine

## 2016-05-27 LAB — CARBAMAZEPINE LEVEL, TOTAL: CARBAMAZEPINE LVL: 3.4 ug/mL — AB (ref 4.0–12.0)

## 2016-05-27 LAB — CBC
HCT: 34.5 % — ABNORMAL LOW (ref 39.0–52.0)
HEMOGLOBIN: 12.1 g/dL — AB (ref 13.0–17.0)
MCH: 29.8 pg (ref 26.0–34.0)
MCHC: 35.1 g/dL (ref 30.0–36.0)
MCV: 85 fL (ref 78.0–100.0)
PLATELETS: 207 10*3/uL (ref 150–400)
RBC: 4.06 MIL/uL — AB (ref 4.22–5.81)
RDW: 12.9 % (ref 11.5–15.5)
WBC: 8.2 10*3/uL (ref 4.0–10.5)

## 2016-05-27 LAB — BASIC METABOLIC PANEL
Anion gap: 9 (ref 5–15)
BUN: 9 mg/dL (ref 6–20)
CO2: 23 mmol/L (ref 22–32)
CREATININE: 0.81 mg/dL (ref 0.61–1.24)
Calcium: 9 mg/dL (ref 8.9–10.3)
Chloride: 107 mmol/L (ref 101–111)
Glucose, Bld: 75 mg/dL (ref 65–99)
Potassium: 3.6 mmol/L (ref 3.5–5.1)
SODIUM: 139 mmol/L (ref 135–145)

## 2016-05-27 MED ORDER — PANTOPRAZOLE SODIUM 40 MG PO TBEC: 40.0000 mg | DELAYED_RELEASE_TABLET | Freq: Two times a day (BID) | ORAL | Status: DC

## 2016-05-27 MED ORDER — AMITRIPTYLINE HCL 25 MG PO TABS: 25.0000 mg | ORAL_TABLET | Freq: Every day | ORAL | 0 refills | Status: DC

## 2016-05-27 MED ORDER — OMEPRAZOLE 40 MG PO CPDR: 40.0000 mg | DELAYED_RELEASE_CAPSULE | Freq: Two times a day (BID) | ORAL | 1 refills | Status: DC

## 2016-05-27 MED ORDER — LORAZEPAM 0.5 MG PO TABS: 0.5000 mg | ORAL_TABLET | Freq: Three times a day (TID) | ORAL | Status: DC | PRN

## 2016-05-27 MED ORDER — ONDANSETRON 4 MG PO TBDP: 8.0000 mg | ORAL_TABLET | Freq: Three times a day (TID) | ORAL | Status: DC | PRN

## 2016-05-27 MED ORDER — ONDANSETRON 4 MG PO TBDP: 8.0000 mg | ORAL_TABLET | Freq: Three times a day (TID) | ORAL | 2 refills | Status: DC | PRN

## 2016-05-27 NOTE — Discharge Summary (Signed)
Physician Discharge Summary  Charles Beard:[id number] DOB: [birth year] DOA: 05/24/2016  PCP: Nonnie Done., MD  Admit date: 05/24/2016 Discharge date: 05/27/2016   Recommendations for Outpatient Follow-Up:   1. Follow up on urine porphyrins- doubt cause of his N/V as it got better with IV ativan 2. Outpatient family counseling   Discharge Diagnosis:   Active Problems:   Acanthosis nigricans, acquired   ADHD (attention deficit hyperactivity disorder), combined type   GAD (generalized anxiety disorder)   Thyroiditis, autoimmune   Esophageal reflux   Nausea & vomiting   Intractable nausea and vomiting   Cyclical vomiting with nausea   Discharge disposition:  Home  Discharge Condition: Improved.  Diet recommendation: bland  Wound care: None.   History of Present Illness:   Charles Beard  is a 20 y.o. male,  With history of delayed puberty syndrome, hypothyroidism, prediabetes follows with Dr. Fransico Michael endocrinology, cyclical vomiting ongoing for the last 2-3 years follows with [facility] history of unremarkable gastric emptying study and upper GI series, ADHD, generalized anxiety, follows with Dr. [name] psychiatrist, comes to the ER from endocrinology office with chief complaints of recurrent nausea vomiting ongoing for the last few days.  He today went to the endocrinology office for his routine follow-up for delayed puberty and hypothyroidism, he was scheduled for an outpatient MRI however he complained of ongoing nausea vomiting and was sent to the ER. In the ER he was also seen by [facility] agent is well known to them, they requested [place] to admit for elective EGD tomorrow.  Patient in the ER appears extremely anxious, he does not appear to be in any distress however, besides nausea vomiting he has some generalized abdominal ache/pain, he is not suicidal homicidal but anxious, denies any blood in his vomitus, denies any diarrhea or constipation, no blood in stool or  urine, no unintentional weight loss. No other subjective complaints. Of note his urine drug screen was positive for marijuana and he has history of marijuana use in the past.    Hospital Course by Problem:   Cyclical vomiting syndrome ongoing for a few years.  -Previous workup was unremarkable which includes gastric emptying study and upper GI series, patient well-known to [facility], he does carrya diagnosis of reflux for which he is on Protonix, -tolerating soft diet -EGD- biopsy negative ? 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 Patient follows with endocrinologist Dr. Fransico Michael -MRI brain which will be ordered. Once discharged he will follow with his endocrinologist.  ADHD, anxiety, marijuana use. Continue home medications follow with primary psychiatrist Dr. Princess Perna.Currently not suicidal homicidal   Reflux.  -PPI  Chest pain -suspect GI related tx to tele -cycle CE- negative    Medical Consultants:   GI psych  Discharge Exam:   Vitals:   05/26/16 2040 05/27/16 0536  BP: 115/79 124/70  Pulse: 63 73  Resp: 16 16  Temp: 98.5 F (36.9 C) 97.9 F (36.6 C)   Vitals:   05/26/16 0521 05/26/16 1547 05/26/16 2040 05/27/16 0536  BP: 128/76 112/69 115/79 124/70  Pulse: (!) 133 64 63 73  Resp: 16 16 16 16   Temp: 97.8 F (36.6 C) 98.7 F (37.1 C) 98.5 F (36.9 C) 97.9  F (36.6 C)  TempSrc: Oral Oral Oral Oral  SpO2: 100% 97% 100% 99%  Weight: 69.2 kg (152 lb 8 oz)   68.8 kg (151 lb 11.2 oz)  Height:        Gen:  NAD    The results of significant diagnostics from this hospitalization (including imaging, microbiology, ancillary and  laboratory) are listed below for reference.     Procedures and Diagnostic Studies:   Mr Laqueta Jean MW Contrast  Result Date: 05/24/2016 CLINICAL DATA:  Nausea vomiting.  Anxiety. EXAM: MRI HEAD WITHOUT AND WITH CONTRAST TECHNIQUE: Multiplanar, multiecho pulse sequences of the brain and surrounding structures were obtained without and with intravenous contrast. CONTRAST:  14mL MULTIHANCE GADOBENATE DIMEGLUMINE 529 MG/ML IV SOLN COMPARISON:  CT 07/29/2013.  MRI 05/30/2013 FINDINGS: Ventricle size normal.  Cerebral volume normal. Negative for acute or chronic infarction. Negative for demyelinating disease. Cerebral white matter normal. Basal ganglia and brainstem normal. Cerebellum normal. Negative for intracranial hemorrhage.  No fluid collection. Negative for mass or edema.  No shift of the midline structures. Postcontrast imaging demonstrates normal enhancement. No enhancing mass lesion. Leptomeningeal enhancement normal. Normal venous enhancement. Pituitary normal in size. Normal orbital structures. Paranasal sinuses clear. Normal calvarium. IMPRESSION: Normal MRI of the brain with contrast. Electronically Signed   By: Marlan Palau M.D.   On: 05/24/2016 19:27    Labs:   Basic Metabolic Panel:  Recent Labs Lab 05/23/16 1007 05/24/16 1440 05/25/16 0406 05/26/16 0516 05/27/16 0525  NA 141 142 142 140 139  K 3.9 3.5 4.5 3.8 3.6  CL 104 109 112* 108 107  CO2 GLUCOSE 89 90 98 79 75  BUN CREATININE 0.88 0.81 0.81 0.87 0.81  CALCIUM 10.0 9.3 9.3 9.1 9.0   GFR Estimated Creatinine Clearance: 133.5 mL/min (by C-G formula based on SCr of 0.81 mg/dL). Liver Function Tests:  Recent Labs Lab 05/23/16 1007 05/24/16 1440 05/25/16 0406  AST ALT 25 19 16*  ALKPHOS 116 96 88  BILITOT 0.7 0.8 0.6  PROT 8.7* 7.3 6.8  ALBUMIN 5.1* 4.5 4.1    Recent Labs Lab 05/23/16 1007 05/24/16 1440  LIPASE 12 11   No results for input(s): AMMONIA in the last  168 hours. Coagulation profile  Recent Labs Lab 05/25/16 0406  INR 1.23    CBC:  Recent Labs Lab 05/23/16 1007 05/24/16 1440 05/25/16 0406 05/26/16 0516 05/27/16 0525  WBC 10.5 10.1 10.4 8.0 8.2  NEUTROABS 7.9* 6.2  --   --   --   HGB 14.8 13.2 12.2* 12.1* 12.1*  HCT 44.0 39.2 36.5* 36.0* 34.5*  MCV 88.2 85.4 87.1 87.0 85.0  PLT 249 242 223 225 207   Cardiac Enzymes:  Recent Labs Lab 05/25/16 1111 05/25/16 1539 05/25/16 2144  TROPONINI <0.03 <0.03 <0.03   BNP: Invalid input(s): POCBNP CBG: No results for input(s): GLUCAP in the last 168 hours. D-Dimer No results for input(s): DDIMER in the last 72 hours. Hgb A1c  Recent Labs  05/24/16 1439  HGBA1C 5.3   Lipid Profile No results for input(s): CHOL, HDL, LDLCALC, TRIG, CHOLHDL, LDLDIRECT in the last 72 hours. Thyroid function studies  Recent Labs  05/25/16 0406  TSH 1.561   Anemia work up No results for input(s): VITAMINB12, FOLATE, FERRITIN, TIBC, IRON, RETICCTPCT in the last 72 hours. Microbiology No results found for this or any previous visit (from the past 240 hour(s)).  Discharge Instructions:   Discharge Instructions    Discharge instructions    Complete by:  As directed   Bland diet, avoid spicy foods Use other coping mechanisms for stress relief-- drawing, exercise etc Do not use marijuana   Increase activity slowly    Complete by:  As directed       Medication List    STOP taking these medications   LORazepam 0.5 MG tablet Commonly known as:  ATIVAN     TAKE these medications   amitriptyline 25 MG tablet Commonly known as:  ELAVIL Take 1 tablet (25 mg total) by mouth at bedtime.   calcium carbonate 600 MG Tabs tablet Commonly known as:  OS-CAL Take 600 mg by mouth every evening. Reported on 04/05/2016   carbamazepine 200 MG 12 hr tablet Commonly known as:  TEGRETOL XR Take 200 mg by mouth 2 (two) times daily. Reported on 04/05/2016   escitalopram 20 MG tablet Commonly  known as:  LEXAPRO Take 20 mg by mouth daily. Reported on 04/05/2016   guanFACINE 1 MG Tb24 Commonly known as:  INTUNIV Take 1 mg by mouth daily. Reported on 04/05/2016   loratadine 10 MG tablet Commonly known as:  CLARITIN Take 10 mg by mouth daily as needed for allergies. Reported on 04/05/2016   Melatonin 10 MG Tabs Take 10 mg by mouth at bedtime.   multivitamin with minerals Tabs tablet Take 1 tablet by mouth daily.   omeprazole 40 MG capsule Commonly known as:  PRILOSEC Take 1 capsule (40 mg total) by mouth 2 (two) times daily. What changed:  when to take this   ondansetron 4 MG disintegrating tablet Commonly known as:  ZOFRAN ODT Take 2 tablets (8 mg total) by mouth every 8 (eight) hours as needed for nausea or vomiting. What changed:  how much to take   promethazine 25 MG tablet Commonly known as:  PHENERGAN Take 1 tablet (25 mg total) by mouth every 8 (eight) hours as needed for nausea or vomiting.   SYNTHROID 25 MCG tablet Generic drug:  levothyroxine TAKE 1 TABLET BY MOUTH EVERY DAY BEFORE BREAKFAST      Follow-up Information    SLATOSKY,JOHN J., MD Follow up in 1 week(s).   Specialty:  Family Medicine Contact information: [address] [place] [address] [phone]            Time coordinating discharge:  Signed:  Ole Lafon U Georgeanna Radziewicz   [facility] 05/27/2016, 1:21 PM

## 2016-05-27 NOTE — Progress Notes (Deleted)
LCSWA met with mother. Patient sleep at the time. Provided mother with list of family counseling agencies. Expressed needing support as a family to deal with life stressors.Breifly discussed patient autonomy and responsibilities at age 20.. Patient mother expressed she knows there relationship has turn into a friendship instead of mother/son dynamic. She reports they argue more and fight about him coming in late and smoking marijuana. She reports It has been tough since patient father is not very helpful.  LCSWA offered emotional support.  Patient mother was appreciative and open to family counseling.  Kathrin Greathouse, Latanya Presser, MSW Clinical Social Worker 5E and Psychiatric Service Line [phone] 05/27/2016  3:23 PM

## 2016-05-27 NOTE — Progress Notes (Signed)
Spoke with Pt and mother at bedside concerning HH needs.  Pt and mother asked for RW, ordered from [facility].

## 2016-05-27 NOTE — Progress Notes (Signed)
Came back to see patient.  Ate mashed potatoes.  I asked him what he thinks is causing his issues-- mother interrupted stating he's not a doctor, he doesn't know.  Patient says he has a lot of anxiety at home.  He and his mother do not get along.  He says he used to draw to relieve stress but has not in a long time.    I think they would benefit from family counseling:  Mother continually interrupted Huynh as he tried to speak with me.  Laughing at his responses and getting agitated saying "after this, I need a break.   I'm leaving.  I get stressed when he comes home at 3AM."  Previously she stated he could not use marijuana as he was with her 24/7.  After mother be-littled patient, he began to cry.   Social worker to find psychologist for family counseling.  Marlin Canary DO

## 2016-05-27 NOTE — Progress Notes (Signed)
The date of service should read 05/27/2016 not 05/23/2016. Epic did not save as such on day of service.  LCSWA met with mother. Patient sleep at the time. Provided mother with list of family counseling agencies. Expressed needing support as a family to deal with life stressors.Breifly discussed patient autonomy and responsibilities at age 20.. Patient mother expressed she knows there relationship has turn into a friendship instead of mother/son dynamic. She reports they argue more and fight about him coming in late and smoking marijuana. She reports It has been tough since patient father is not very helpful.  LCSWA offered emotional support.  Patient mother was appreciative and open to family counseling.  Kathrin Greathouse, Latanya Presser, MSW Clinical Social Worker 5E and Psychiatric Service Line [phone] 05/27/2016  3:23 PM

## 2016-05-30 LAB — PORPHOBILINOGEN, RANDOM URINE: Quantitative Porphobilinogen: 0.2 mg/L (ref 0.0–2.0)

## 2016-05-31 ENCOUNTER — Ambulatory Visit: Payer: Medicaid Other | Admitting: Gastroenterology

## 2016-06-02 LAB — PORPHYRINS, FRACTIONATED, RANDOM URINE
COPROPORPHYRIN, RANDOM UR: 17.3 ug/g{creat} (ref 5.6–28.6)
Coproporphyrin III (PORFRU): 52.7 mcg/g creat (ref 4.1–76.4)
PENTACARBOXYPORPHYRIN, RANDOM UR: 1.1 ug/g{creat} (ref <=3.5)
TOTAL PORPHYRINS, RANDOM UR: 78.6 ug/g{creat} (ref 23.3–132.4)
UROPORPHYRIN, RANDOM UR: 6.3 ug/g{creat} (ref 3.1–18.2)
Uroporphyrin III (PORFRU): 1.2 mcg/g creat (ref <=4.8)

## 2016-07-12 ENCOUNTER — Ambulatory Visit (INDEPENDENT_AMBULATORY_CARE_PROVIDER_SITE_OTHER): Payer: Medicaid Other | Admitting: "Endocrinology

## 2016-07-12 ENCOUNTER — Encounter: Payer: Self-pay | Admitting: "Endocrinology

## 2016-07-12 VITALS — BP 124/87 | HR 80 | Ht 66.3 in | Wt 149.8 lb

## 2016-07-12 DIAGNOSIS — R110 Nausea: Secondary | ICD-10-CM

## 2016-07-12 DIAGNOSIS — I10 Essential (primary) hypertension: Secondary | ICD-10-CM

## 2016-07-12 DIAGNOSIS — E063 Autoimmune thyroiditis: Secondary | ICD-10-CM

## 2016-07-12 DIAGNOSIS — E049 Nontoxic goiter, unspecified: Secondary | ICD-10-CM | POA: Diagnosis not present

## 2016-07-12 DIAGNOSIS — K219 Gastro-esophageal reflux disease without esophagitis: Secondary | ICD-10-CM

## 2016-07-12 DIAGNOSIS — E038 Other specified hypothyroidism: Secondary | ICD-10-CM | POA: Diagnosis not present

## 2016-07-12 DIAGNOSIS — R6250 Unspecified lack of expected normal physiological development in childhood: Secondary | ICD-10-CM

## 2016-07-12 DIAGNOSIS — G8929 Other chronic pain: Secondary | ICD-10-CM

## 2016-07-12 DIAGNOSIS — R1013 Epigastric pain: Secondary | ICD-10-CM

## 2016-07-12 DIAGNOSIS — R7303 Prediabetes: Secondary | ICD-10-CM

## 2016-07-12 DIAGNOSIS — E300 Delayed puberty: Secondary | ICD-10-CM

## 2016-07-12 DIAGNOSIS — L83 Acanthosis nigricans: Secondary | ICD-10-CM

## 2016-07-12 DIAGNOSIS — E663 Overweight: Secondary | ICD-10-CM

## 2016-07-12 LAB — GLUCOSE, POCT (MANUAL RESULT ENTRY): POC GLUCOSE: 71 mg/dL (ref 70–99)

## 2016-07-12 LAB — POCT GLYCOSYLATED HEMOGLOBIN (HGB A1C)

## 2016-07-12 NOTE — Progress Notes (Signed)
Subjective:  Patient Name: Charles Beard Date of Birth: [birth year]  MRN: [id number]  Charles Beard  presents to the office today for follow up evaluation and management of his puberty delay, obesity, gynecomastia, and linear growth delay/short stature.   HISTORY OF PRESENT ILLNESS:   Charles Beard is a 20 y.o. Caucasian young man.   Dre was accompanied by his mother.  1. Charles Beard's initial [facility] pediatric endocrine consultation was on 05/22/13 at age 48  A. [name]: Term, emergency C-section for failure to progress.  Birth weight: 7 lbs-15 oz. Healthy newborn.  B. Infancy: Healthy  C. Childhood: ADHD was diagnosed about age 70. Charles Beard was followed at [facility]. Stimulants and non-stimulants did not work.  He also had severe anger issues. He sometimes bit himself or hit things, but had not hurt himself seriously. He was hit in the forehead by a pitched baseball at about age 36-8. There was no loss of consciousness. His only surgery was curettage of plantar warts. He had seasonal allergies, but no medication allergies.    D. Obesity: Mom had been concerned about his weight gain for several years. Mom tried to get him to eat healthy. According to mom, dad fed him whatever he wanted. Neither dad nor Kimari exercised.   E. Puberty delay: He had no axillary hair, pubic hair, or genital development.  F. Pertinent family history: Strong FH of obesity in mom and  maternal relatives. FH of T2DM in maternal grandmother. No FH of delayed puberty. Hypothyroidism in mom, MGM, MGGM. Neither mom nor MGM had thyroid surgery or irradiation. Menopause at age 82 in mom, MGM, and MGGM. Mental health issues on dad's side of family. Dad had severe mood swings. Dad was an alcoholic and PGF was an alcoholic. [Addendum 11/18/14: Dad also used drugs. Mom and dad divorced when [name] was age 31. Mom also had depression.]  G. On exam, his BP was elevated at 122/87. He was quite agitated during the visit,  supposedly because he was angry that mom was sharing his personal health information with yet another person. He was significantly overweight. He had a 19-20 gram goiter. His breasts were fatty with a Tanner stage 3 appearance and enlarged areolae, but no breast buds. His abdomen was enlarged. His pubic hair was Tanner stage 1. His testes were 2-3 mL in volume, very early pubertal. Lab tests showed normal CMP,TFTs, and TPO antibody levels. His FSH was 2.2, LH 0.3,  testosterone 63, and estradiol 15.4, all c/w being in early puberty.  H. I diagnosed pubertal delay, linear growth delay, early gynecomastia, overweight, dyspepsia, goiter, and acanthosis. I instructed Charles Beard and his mother on our Eat Right Diet and proper exercise techniques. I also started him on ranitidine, 150 mg, twice daily.   2. During the past three years [name] has had slow progression of puberty. As he has grown older and has had adjustments in his psychiatric medications, he has also become more emotionally stable and less emotionally reactive. 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 He has had recurrent problems with cyclical vomiting syndrome.   3. Marquie's last [facility] visit was on 05/24/16.   A. At our last visit I had arranged for Charles Beard to be seen for an immediate consultation at [facility]. He was admitted to the [facility]. An upper GI endoscopy revealed a small hiatal hernia and two non-bleeding erosions in his stomach. The duodenum was normal. He  was started on amitriptyline. His Prilosec was increased to twice daily. An MRI of his head was normal. He was told to return to his PCP for follow up.  B. Charles Beard still has frequent epigastric abdominal pain, nausea, and vomiting, but less frequently. He needed two phenergan yesterday to treat his nausea. He has not felt well enough to eat or drink much in the past 3 days.    C. In the interim he has been otherwise healthy,  but has had a headache for two days. His appetite is good when he is not having GI problems. He has been walking and riding his bike for about 45 minutes when he feels well.   D. He takes Tegretol, Tenex, Lexapro, and omeprazole when he is not too sick to keep the meds down. . Mom took him off Concerta since he has not been in school. He remains on Synthroid, 25 mcg/day.  [name] continues to see his psychiatrist, Dr. [name]. His anxiety and depression have been better since starting Lexapro and changing to Tenex.    F. He had psychological testing in the past. Charles Beard was found to have "a little bit of a low IQ". ADD was also noted. An individual IEP was developed. Last year he was in some classes with only 4 students per class.   3.  Pertinent Review of Systems:  Constitutional: He feels "pain in my stomach" today. He has fairly persistent nausea, acid indigestion, reflux, and epigastric pain. As a result he has not eaten much for the past 3 days Eyes: Vision seems to be good, better if he wears his glasses. He had a new eye exam on 06/01/15 and has a new pair of eye glasses.There are no recognized eye problems. He is scheduled for a follow up eye exam soon.  Neck: The patient has no complaints of anterior neck swelling, soreness, tenderness, pressure, discomfort, or difficulty swallowing.   Heart: Heart rate increases with exercise or other physical activity. The patient has no complaints of palpitations, irregular heart beats, chest pain, or chest pressure.   Gastrointestinal: As above. He is more constipated recently. Legs: Muscle mass and strength seem normal. There are no complaints of numbness, tingling, burning, or pain. No edema is noted.  Feet: There are no complaints of numbness, tingling, burning, or pain. No edema is noted. Neurologic: There are no recognized problems with muscle movement and strength, sensation, or coordination. GU: He has more pubic hair and axillary hair. He says that  his genitalia have not changed. He still occasionally has difficulty urinating.  PAST MEDICAL, FAMILY, AND SOCIAL HISTORY  Past Medical History:  Diagnosis Date  . ADHD (attention deficit hyperactivity disorder) 2000  . Anxiety   . Depression   . Headache(784.0)   . Hypothyroidism   . Prediabetes   . Puberty delay     Family History  Problem Relation Age of Onset  . Thyroid disease Mother   . Diabetes Maternal Grandmother     and MGGM  . Heart disease Maternal Grandmother     and MGGM  . Breast cancer Other     and MGGM     Current Outpatient Prescriptions:  .  amitriptyline (ELAVIL) 25 MG tablet, Take 1 tablet (25 mg total) by mouth at bedtime., Disp: 30 tablet, Rfl: 0 .  calcium carbonate (OS-CAL) 600 MG TABS tablet, Take 600 mg by mouth every evening. Reported on 04/05/2016, Disp: , Rfl:  .  carbamazepine (TEGRETOL XR) 200 MG 12 hr  tablet, Take 200 mg by mouth 2 (two) times daily. Reported on 04/05/2016, Disp: , Rfl:  .  escitalopram (LEXAPRO) 20 MG tablet, Take 20 mg by mouth daily. Reported on 04/05/2016, Disp: , Rfl:  .  guanFACINE (INTUNIV) 1 MG TB24, Take 1 mg by mouth daily. Reported on 04/05/2016, Disp: , Rfl:  .  Melatonin 10 MG TABS, Take 10 mg by mouth at bedtime., Disp: , Rfl:  .  Multiple Vitamin (MULTIVITAMIN WITH MINERALS) TABS tablet, Take 1 tablet by mouth daily., Disp: , Rfl:  .  omeprazole (PRILOSEC) 40 MG capsule, Take 1 capsule (40 mg total) by mouth 2 (two) times daily., Disp: 30 capsule, Rfl: 1 .  ondansetron (ZOFRAN ODT) 4 MG disintegrating tablet, Take 2 tablets (8 mg total) by mouth every 8 (eight) hours as needed for nausea or vomiting., Disp: 20 tablet, Rfl: 2 .  promethazine (PHENERGAN) 25 MG tablet, Take 1 tablet (25 mg total) by mouth every 8 (eight) hours as needed for nausea or vomiting., Disp: 90 tablet, Rfl: 1 .  SYNTHROID 25 MCG tablet, TAKE 1 TABLET BY MOUTH EVERY DAY BEFORE BREAKFAST, Disp: 30 tablet, Rfl: 5 .  loratadine (CLARITIN) 10 MG tablet,  Take 10 mg by mouth daily as needed for allergies. Reported on 04/05/2016, Disp: , Rfl:   Allergies as of 07/12/2016 - Review Complete 07/12/2016  Allergen Reaction Noted  . Reglan [metoclopramide] Other (See Comments) 03/28/2016     reports that he has never smoked. He has never used smokeless tobacco. He reports that he does not drink alcohol or use drugs. Pediatric History  Patient Guardian Status  . Mother:  Quirino, Kakos   Other Topics Concern  . Not on file   Social History Narrative   Lives at home with mom and 2 cats, visits dad every other weekend, will attend [employer], will start 9th grade in the fall.    1. School and Family: As above. He is in trying to finish high school online through a program in [place]. Ezel will not return to public school. Turner lives with mom, but has a weekend day with dad occasionally.    2. Activities: His physical activities vary with his GI symptoms.  3. Primary Care Provider: Dr. Cheri Rous in [place], [facility], phone [phone] 4. Psychiatrist: Dr. Joan Flores Akintayo 5. GI: Ms [name], PA at [facility]  REVIEW OF SYSTEMS: There are no other significant problems involving Deagen's other body systems.   Objective:   BP 124/87   Pulse 80   Ht 5' 6.3" (1.684 m)   Wt 149 lb 12.8 oz (67.9 kg)   BMI 23.96 kg/m    Ht Readings from Last 3 Encounters:  07/12/16 5' 6.3" (1.684 m) (13 %, Z= -1.14)*  05/24/16 5\' 6"  (1.676 m) (11 %, Z= -1.24)*  05/24/16 5\' 6"  (1.676 m) (11 %, Z= -1.24)*   * Growth percentiles are based on CDC 2-20 Years data.   Wt Readings from Last 3 Encounters:  07/12/16 149 lb 12.8 oz (67.9 kg) (46 %, Z= -0.11)*  05/27/16 151 lb 11.2 oz (68.8 kg) (50 %, Z= -0.01)*  05/24/16 149 lb (67.6 kg) (45 %, Z= -0.12)*   * Growth percentiles are based on CDC 2-20 Years data.   HC Readings from Last 3 Encounters:  No data found for [facility]   Body surface area is 1.78 meters squared. 13 %ile  (Z= -1.14) based on CDC 2-20 Years stature-for-age data using vitals from  07/12/2016. 46 %ile (Z= -0.11) based on CDC 2-20 Years weight-for-age data using vitals from 07/12/2016.    PHYSICAL EXAM:  Constitutional: The patient appears uncomfortable and somewhat antalgic. He is alert and fairly bright. He engaged fairly well. His affect is subdued. His insight is fair. He has lost 2 more pounds since last visit. His weight has decreased to the 45.81%. Today's visit went well. There was no overt emotional friction between FarmerAaron and mom. Mom was more supportive and less critical. [name] was not overtly reactive. Head: The head is normocephalic. Face: The face appears normal. There are no obvious dysmorphic features or plethora. Eyes: The eyes appear to be normally formed and spaced. Gaze is conjugate. There is no obvious arcus or proptosis. Eye moisture is normal.  Ears: The ears are normally placed and appear externally normal. Mouth: The oropharynx and tongue appear normal. Dentition appears to be normal for age. Mouth is a bit dry.  There is no hyperpigmentation. He has a grade II fine, blond mustache.  Neck: The neck appears to be visibly enlarged. No carotid bruits are noted. The thyroid gland is smaller, but still slightly enlarged at about 21 grams in size. The right lobe is now normal in size. The left lobe is again larger than the right. The consistency of the thyroid gland is normal today. The thyroid gland is tender to palpation in the left superior pole. He has 1+ acanthosis.  Lungs: The lungs are clear to auscultation. Air movement is good. Heart: Heart rate and rhythm are regular. Heart sounds S1 and S2 are normal. I did not appreciate any pathologic cardiac murmurs. Abdomen: The abdomen is enlarged. Bowel sounds are normal. He is tender in the epigastrium today. Arms: Muscle size and bulk are normal for age. Hands: There is no obvious tremor. Phalangeal and metacarpophalangeal joints are  normal. Palmar muscles are normal for age. Palmar skin is normal. Palmar moisture is also normal. Legs: Muscles appear normal for age. No edema is present. Neurologic: Strength is normal for age in both the upper and lower extremities. Muscle tone is normal. Sensation to touch is normal in both legs.   Psych: He is concerned and anxious about his GI symptoms.    LAB DATA:   Results for orders placed or performed in visit on 07/12/16 (from the past 504 hour(s))  POCT Glucose (CBG)   Collection Time: 07/12/16  9:35 AM  Result Value Ref Range   POC Glucose 71 70 - 99 mg/dl  POCT HgB [id number]   Collection Time: 07/12/16  9:41 AM  Result Value Ref Range   Hemoglobin A1C 5.4%    07/11/16: HbA1c 5.4%  05/25/16: TSH 1.561  05/24/16: HbA1c 5.4%, TSH 1.055  05/23/16: As above  05/18/16: LH 0.6, FSH 1.5, testosterone 23; TSH 1.73, free T4 1.0, free T3 2.9; IGF-1 79, IGFBP-3 4.0  04/05/16: Celiac panel and IgA both normal  02/23/16: HbA1c 5.7%  01/05/16: Urine showed small bilirubin; CMP normal, except for glucose 105 after vomiting; CBC with WBC 11.2;   11/25/15: HbA1c 5.6%  09/26/15: HbA1c 5.8%; IGF-1 110, IGFBP-3 4.0; LH 0.4, FSH 1.9, testosterone 30, free testosterone 3.6, estradiol 14.6; TSH 2.315, TPO antibody <1  06/18/15: Chromosomes 46, XY  06/09/15: HbA1c 5.8%; TSH 1.906, free T4 0.94, free T3 2.8; testosterone 24; IGF-1 110; IGFBP-3 5.0  02/27/15: HbA1c 5.7%; TSH 3.443, free T4 0.74, free T3 3.0; testosterone 29  10/28/14: Testosterone 21, IGF-1 119  08/14/14: TSH 2.835, free T4 0.97, free  T3 3.6; LH 0.6, FSH 1.6, testosterone 18, estradiol < 11.8; IGF-1 108, IGFBP-3 3.8  06/18/14: testosterone 27, prolactin 3.8; TSH 3.106, free T4 0.93, free T3 3.0  11/20/13: TSH 0.5, free T4 0.99, free T3 3.4; LH 0.5, FSH 3.1, testosterone 18, estradiol < 11.8  07/15/13: CBC normal; CMP normal;   05/22/13: CMP normal; TSH 2.46, free T4 1.06, free T3 3.3, TPO antibody 11.2; FSH 2.2, LH 0.3,  testosterone 63, estradiol 15.6  IMAGING:  Bone age 81/21/17: Bone age was read as 14 years at a chronologic age of 55 years and 7 months. I read the bone age as 39-6.   Bone age 58/23/14: Bone age 66 years at chronologic age 12 years, 10 months.   MRI of head 05/30/13: Normal pituitary gland and brain.   Assessment and Plan:   ASSESSMENT:  1. Puberty delay: By testosterone values, Latrail's progress through puberty has stopped. However, in the setting of acute illness his gonadotropins and testosterone can decrease. We will need to repeat his exam and lab tests once this acute GI illness subsides. 2. Growth delay, linear: His height growth has increased somewhat since his last visit.  3. Overweight: He has lost some fat weight due to loss of appetite caused by pain and nausea.  4-6. Reflux/Dyspepsia/abdominal pain: These problem have improved a bit, but I think that he needs to be re-evaluated by GI. If [facility] will not see him, I suggest a referral to Dr. Charlotte Sanes at [facility]. 7-9 Goiter/thyroiditis/hypothyroid:   A. His thyroid gland has decreased in size a bit since his last visit, but is still enlarged today. The process of waxing and waning of thyroid gland size is c/w slowly evolving Hashimoto's thyroiditis.  His thyroiditis is clinically active today.   B. His TFTs in July were mid-normal on his current dose of Synthroid.    10. Acanthosis: He has acquired acanthosis, due to hyperinsulinemia, which in turn was caused by the resistance to insulin resulting from the excessive production of cytokines by overly fat adipose cells. This problem is reversible if he loses enough fat weight.  11. Hypertension: His SBP is normal, but his DBP is still elevated.  He needs to continue to do more exercise and lose more fat weight.  12. Prediabetes: His HbA1c is again at 5.4%, which is near the upper limit of the normal range, c/w his loss of fat weight.     PLAN:   1. Diagnostic: I reviewed  current lab results, EGD results, and imaging results with the family. We will repeat his puberty and GH studies at next visit if his GI symptoms have improved.  2. Therapeutic: Continue current medications. I recommend follow up with [facility] if they will see him or referral to Dr. Matthias Hughs in [facility]. 3. Patient education: We spent the majority of the visit discussing his GI problems. We also briefly discussed his other issues, to include puberty delay, growth delay, insulin resistance, hyperinsulinemia, goiter, and autoimmune thyroid disease at length. We also discussed his improvement clinically and chemically in terms of his puberty delay that we saw at his last visit. The fact that he has entered puberty is a good sign, but we will not know for quite some time how far he will progress through puberty and how much testosterone he will be capable of producing as an adult.  4. Follow-up: 3 months. Consider repeating his puberty and growth studies then.   Level of Service: This visit lasted  in excess of 55 minutes. More than 50% of the visit was devoted to counseling.  [name], MD

## 2016-09-15 ENCOUNTER — Other Ambulatory Visit (INDEPENDENT_AMBULATORY_CARE_PROVIDER_SITE_OTHER): Payer: Self-pay | Admitting: *Deleted

## 2016-09-15 DIAGNOSIS — E300 Delayed puberty: Secondary | ICD-10-CM

## 2016-10-13 ENCOUNTER — Ambulatory Visit (INDEPENDENT_AMBULATORY_CARE_PROVIDER_SITE_OTHER): Payer: Self-pay | Admitting: "Endocrinology

## 2016-12-21 ENCOUNTER — Telehealth (INDEPENDENT_AMBULATORY_CARE_PROVIDER_SITE_OTHER): Payer: Self-pay

## 2016-12-21 NOTE — Telephone Encounter (Signed)
  Who's calling (name and relationship to patient) :mom; Angelia  Best contact number:[phone]  Provider they ZOX:WRUEAVWsee:Brennan  Reason for call:mom wants to make sure lab orders are in and released     PRESCRIPTION REFILL ONLY  Name of prescription:  Pharmacy:

## 2016-12-22 NOTE — Telephone Encounter (Signed)
Orders are ready

## 2016-12-30 LAB — T3, FREE: T3 FREE: 3.9 pg/mL (ref 3.0–4.7)

## 2016-12-30 LAB — FOLLICLE STIMULATING HORMONE: FSH: 2.2 m[IU]/mL (ref 1.6–8.0)

## 2016-12-30 LAB — HEMOGLOBIN A1C
Hgb A1c MFr Bld: 5.1 % (ref <5.7)
Mean Plasma Glucose: 100 mg/dL

## 2016-12-30 LAB — TESTOSTERONE TOTAL,FREE,BIO, MALES
Albumin: 4.2 g/dL (ref 3.6–5.1)
Sex Hormone Binding: 49 nmol/L (ref 10–50)
Testosterone: 204 ng/dL — ABNORMAL LOW (ref 250–827)

## 2016-12-30 LAB — LUTEINIZING HORMONE: LH: 2.4 m[IU]/mL (ref 1.5–9.3)

## 2016-12-30 LAB — ESTRADIOL: Estradiol: <15 pg/mL (ref <=39)

## 2016-12-30 LAB — TSH: TSH: 2.24 mIU/L (ref 0.50–4.30)

## 2016-12-30 LAB — T4, FREE: FREE T4: 1.3 ng/dL (ref 0.8–1.4)

## 2017-01-01 LAB — IGF BINDING PROTEIN 3, BLOOD: IGF Binding Protein 3: 5.9 mg/L (ref 2.9–7.3)

## 2017-01-02 LAB — INSULIN-LIKE GROWTH FACTOR
IGF-I, LC/MS: 262 ng/mL (ref 108–548)
Z-SCORE (MALE): -0.1 {STDV} (ref ?–2.0)

## 2017-01-05 ENCOUNTER — Encounter (INDEPENDENT_AMBULATORY_CARE_PROVIDER_SITE_OTHER): Payer: Self-pay | Admitting: "Endocrinology

## 2017-01-05 ENCOUNTER — Ambulatory Visit (INDEPENDENT_AMBULATORY_CARE_PROVIDER_SITE_OTHER): Payer: Medicaid Other | Admitting: "Endocrinology

## 2017-01-05 VITALS — BP 118/60 | HR 90 | Ht 66.42 in | Wt 139.6 lb

## 2017-01-05 DIAGNOSIS — E300 Delayed puberty: Secondary | ICD-10-CM

## 2017-01-05 DIAGNOSIS — E663 Overweight: Secondary | ICD-10-CM | POA: Diagnosis not present

## 2017-01-05 DIAGNOSIS — E049 Nontoxic goiter, unspecified: Secondary | ICD-10-CM | POA: Diagnosis not present

## 2017-01-05 DIAGNOSIS — E038 Other specified hypothyroidism: Secondary | ICD-10-CM | POA: Diagnosis not present

## 2017-01-05 DIAGNOSIS — R1115 Cyclical vomiting syndrome unrelated to migraine: Secondary | ICD-10-CM

## 2017-01-05 DIAGNOSIS — E063 Autoimmune thyroiditis: Secondary | ICD-10-CM | POA: Diagnosis not present

## 2017-01-05 DIAGNOSIS — G43A0 Cyclical vomiting, not intractable: Secondary | ICD-10-CM | POA: Diagnosis not present

## 2017-01-05 DIAGNOSIS — L83 Acanthosis nigricans: Secondary | ICD-10-CM

## 2017-01-05 DIAGNOSIS — R7303 Prediabetes: Secondary | ICD-10-CM

## 2017-01-05 DIAGNOSIS — I10 Essential (primary) hypertension: Secondary | ICD-10-CM

## 2017-01-05 MED ORDER — LEVOTHYROXINE SODIUM 25 MCG PO TABS: ORAL_TABLET | ORAL | 5 refills | Status: DC

## 2017-01-05 NOTE — Progress Notes (Signed)
Subjective:  Patient Name: Charles Beard Date of Birth: September 24, 1996  MRN: [id number]  Charles Beard  presents to the office today for follow up evaluation and management of his puberty delay, obesity, gynecomastia, and linear growth delay/short stature.   HISTORY OF PRESENT ILLNESS:   Charles Beard is a 21 y.o. Caucasian young man.   Charles Beard was accompanied by his mother.  1. Charles Beard's initial [facility] pediatric endocrine consultation was on 05/22/13 at age 37  A. [name]: Term, emergency C-section for failure to progress. Birth weight: 7 lbs-15 oz. Healthy newborn.  B. Infancy: Healthy  C. Childhood: ADHD was diagnosed about age 67. Charles Beard was followed at [facility] and [facility]. Stimulants and non-stimulants did not work.  He also had severe anger issues. He sometimes bit himself or hit things, but had not hurt himself seriously. He was hit in the forehead by a pitched baseball at about age 63-8. There was no loss of consciousness. His only surgery was curettage of plantar warts. He had seasonal allergies, but no medication allergies.    D. Obesity: Mom had been concerned about his weight gain for several years. Mom tried to get him to eat healthy. According to mom, dad fed him whatever he wanted. Neither dad nor Charles Beard exercised.   E. Puberty delay: He had no axillary hair, pubic hair, or genital development.  F. Pertinent family history: Strong FH of obesity in mom and  maternal relatives. FH of T2DM in maternal grandmother. No FH of delayed puberty. Hypothyroidism in mom, MGM, MGGM. Neither mom nor MGM had thyroid surgery or irradiation. Menopause at age 39 in mom, MGM, and MGGM. Mental health issues on dad's side of family. Dad had severe mood swings. Dad was an alcoholic and PGF was an alcoholic. [Addendum 11/18/14: Dad also used drugs. Mom and dad divorced when Charles Beard was age 65. Mom also had depression.]  G. On exam, his BP was elevated at 122/87. He was quite agitated during the visit,  supposedly because he was angry that mom was sharing his personal health information with yet another person. He was significantly overweight. He had a 19-20 gram goiter. His breasts were fatty with a Tanner stage 3 appearance and enlarged areolae, but no breast buds. His abdomen was enlarged. His pubic hair was Tanner stage 1. His testes were 2-3 mL in volume, very early pubertal. Lab tests showed normal CMP,TFTs, and TPO antibody levels. His FSH was 2.2, LH 0.3,  testosterone 63, and estradiol 15.4, all c/w being in early puberty.  H. I diagnosed pubertal delay, linear growth delay, early gynecomastia, overweight, dyspepsia, goiter, and acanthosis. I instructed Charles Beard and his mother on our Eat Right Diet and proper exercise techniques. I also started him on ranitidine, 150 mg, twice daily.   2. During the past three years Charles Beard has had slow progression of puberty. As he has grown older and has had adjustments in his psychiatric medications, he has also become more emotionally stable and less emotionally reactive. 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 He has had recurrent problems with cyclical vomiting syndrome.   3. Charles Beard's last [facility] visit was on 07/12/16. In the interim he has not had any new health issues or problems.   A. At our last visit he was still having significant problems with nausea, abdominal pains, and vomiting. Mom stopped the omeprazole due to things she heard on TV about the side effects. Family did not go back  to GI at [facility] and did not take my recommendation to see Dr. Nadine CountsBob Beard.  Charles Beard is still taking amitriptyline.   Charles Beard. Charles Beard still has frequent nausea, but less abdominal pain. Mom says that he has nausea every single morning upon awakening. When he has pain it is usually in the RUQ. He also has reflux and some postprandial bloating. Sometimes he has dry heaves, but no other vomiting. He seems to have slow gastric emptying.  He still takes phenergan for the nausea about twice daily for about 4 days per week.   C. His appetite is fairly good when he is not having GI problems, but he only eats two meals per day. He has not been walking and riding his bike due to the cold.    D. He sometimes takes his carbamazepine and escitalopram when he is not too sick to keep the meds down. He remains on Synthroid, 25 mcg/day most days.  Charles BoutonE. Charles Beard continues to see his psychiatrist, Charles Beard. His anxiety and depression have been variable, sometimes he has been hysterical. He has a follow up visit in one week.   F. He had psychological testing in the past. According to mom, Charles Beard was found to have "a little bit of a low IQ". ADD was also noted. An individual IEP was developed. Last year he was in some classes with only 4 students per class.   3.  Pertinent Review of Systems:  Constitutional: He feels "good and hungry" today. He has not eaten much for the past 3 days Eyes: Vision seems to be good, better if he wears his glasses. He had a new eye exam on 06/01/15 and has a new pair of eye glasses.There are no recognized eye problems. He is not yet scheduled for a follow up eye exam.  Neck: The patient has no complaints of anterior neck swelling, soreness, tenderness, pressure, discomfort, or difficulty swallowing.   Heart: Heart rate increases with exercise or other physical activity. The patient has no complaints of palpitations, irregular heart beats, chest pain, or chest pressure.   Gastrointestinal: As above. Bowel movements are good.  Legs: Muscle mass and strength seem normal. There are no complaints of numbness, tingling, burning, or pain. No edema is noted.  Feet: There are no complaints of numbness, tingling, burning, or pain. No edema is noted. Neurologic: There are no recognized problems with muscle movement and strength, sensation, or coordination. GU: He has more pubic hair and axillary hair. He says that his genitalia are  larger. Voice is a little deeper. He still occasionally has dysuria.   PAST MEDICAL, FAMILY, AND SOCIAL HISTORY  Past Medical History:  Diagnosis Date  . ADHD (attention deficit hyperactivity disorder) 2000  . Anxiety   . Depression   . Headache(784.0)   . Hypothyroidism   . Prediabetes   . Puberty delay     Family History  Problem Relation Age of Onset  . Thyroid disease Mother   . Diabetes Maternal Grandmother     and MGGM  . Heart disease Maternal Grandmother     and MGGM  . Breast cancer Other     and MGGM     Current Outpatient Prescriptions:  .  amitriptyline (ELAVIL) 25 MG tablet, Take 1 tablet (25 mg total) by mouth at bedtime., Disp: 30 tablet, Rfl: 0 .  calcium carbonate (OS-CAL) 600 MG TABS tablet, Take 600 mg by mouth every evening. Reported on 04/05/2016, Disp: , Rfl:  .  guanFACINE (INTUNIV) 1 MG  TB24, Take 1 mg by mouth daily. Reported on 04/05/2016, Disp: , Rfl:  .  Multiple Vitamin (MULTIVITAMIN WITH MINERALS) TABS tablet, Take 1 tablet by mouth daily., Disp: , Rfl:  .  ondansetron (ZOFRAN ODT) 4 MG disintegrating tablet, Take 2 tablets (8 mg total) by mouth every 8 (eight) hours as needed for nausea or vomiting., Disp: 20 tablet, Rfl: 2 .  promethazine (PHENERGAN) 25 MG tablet, Take 1 tablet (25 mg total) by mouth every 8 (eight) hours as needed for nausea or vomiting., Disp: 90 tablet, Rfl: 1 .  SYNTHROID 25 MCG tablet, TAKE 1 TABLET BY MOUTH EVERY DAY BEFORE BREAKFAST, Disp: 30 tablet, Rfl: 5 .  carbamazepine (TEGRETOL XR) 200 MG 12 hr tablet, Take 200 mg by mouth 2 (two) times daily. Reported on 04/05/2016, Disp: , Rfl:  .  escitalopram (LEXAPRO) 20 MG tablet, Take 20 mg by mouth daily. Reported on 04/05/2016, Disp: , Rfl:  .  loratadine (CLARITIN) 10 MG tablet, Take 10 mg by mouth daily as needed for allergies. Reported on 04/05/2016, Disp: , Rfl:  .  Melatonin 10 MG TABS, Take 10 mg by mouth at bedtime., Disp: , Rfl:  .  omeprazole (PRILOSEC) 40 MG capsule, Take 1  capsule (40 mg total) by mouth 2 (two) times daily. (Patient not taking: Reported on 01/05/2017), Disp: 30 capsule, Rfl: 1  Allergies as of 01/05/2017 - Review Complete 01/05/2017  Allergen Reaction Noted  . Reglan [metoclopramide] Other (See Comments) 03/28/2016     reports that he has never smoked. He has never used smokeless tobacco. He reports that he does not drink alcohol or use drugs. Pediatric History  Patient Guardian Status  . Mother:  Lavi, Sheehan   Other Topics Concern  . Not on file   Social History Narrative   Lives at home with mom and 2 cats, visits dad every other weekend, will attend [employer], will start 9th grade in the fall.    1. School and Family: As above. He is in trying to finish high school online through a program in [place]. Pacer will not return to public school. Cayson lives with mom, but has an occasional visit with dad.     2. Activities: His physical activities vary with his GI symptoms.  3. Primary Care Provider: Dr. Cheri Rous in [place], [facility] phone [phone] 4. Psychiatrist: Dr. Joan Flores Akintayo 5. GI: Ms [name], PA at [facility]   REVIEW OF SYSTEMS: There are no other significant problems involving Terris's other body systems.   Objective:   BP 118/60   Pulse 90   Ht 5' 6.42" (1.687 m)   Wt 139 lb 9.6 oz (63.3 kg)   BMI 22.25 kg/m    Ht Readings from Last 3 Encounters:  01/05/17 5' 6.42" (1.687 m) (13 %, Z= -1.12)*  07/12/16 5' 6.3" (1.684 m) (13 %, Z= -1.14)*  05/24/16 5\' 6"  (1.676 m) (11 %, Z= -1.24)*   * Growth percentiles are based on CDC 2-20 Years data.   Wt Readings from Last 3 Encounters:  01/05/17 139 lb 9.6 oz (63.3 kg) (26 %, Z= -0.65)*  07/12/16 149 lb 12.8 oz (67.9 kg) (46 %, Z= -0.11)*  05/27/16 151 lb 11.2 oz (68.8 kg) (50 %, Z= -0.01)*   * Growth percentiles are based on CDC 2-20 Years data.   HC Readings from Last 3 Encounters:  No data found for [facility]   Body surface  area is 1.72 meters squared. 13 %ile (  Z= -1.12) based on CDC 2-20 Years stature-for-age data using vitals from 01/05/2017. 26 %ile (Z= -0.65) based on CDC 2-20 Years weight-for-age data using vitals from 01/05/2017.    PHYSICAL EXAM:  Constitutional: The patient appears tired, but fairly comfortable. He is alert and fairly bright. He engaged fairly well. His affect is still fairly flat. His insight is fair. His height has plateaued at the 13%.  He has lost 10 more pounds since last visit. His weight has decreased to the 26%. His BMI has decreased to the 43%. Today's visit went very well. There was mutual positive interaction and no overt emotional friction between Charles Beard and mom. Mom was supportive and was not critical. Zalman was not  reactive. Head: The head is normocephalic. Face: The face appears normal. There are no obvious dysmorphic features or plethora. Eyes: The eyes appear to be normally formed and spaced. Gaze is conjugate. There is no obvious arcus or proptosis. Eye moisture is normal.  Ears: The ears are normally placed and appear externally normal. Mouth: The oropharynx and tongue appear normal. Dentition appears to be normal for age. Mouth is a bit dry.  There is no hyperpigmentation. He has a grade II fine, blond mustache.  Neck: The neck appears to be visibly enlarged. No carotid bruits are noted. The thyroid gland is slightly more enlarged at about 21-22 grams in size. The right lobe is enlarged today. The left lobe is still enlarged. The consistency of the thyroid gland is normal today. The thyroid gland is not  tender to palpation. He has trace-to-1+ acanthosis.  Lungs: The lungs are clear to auscultation. Air movement is good. Heart: Heart rate and rhythm are regular. Heart sounds S1 and S2 are normal. I did not appreciate any pathologic cardiac murmurs. Abdomen: The abdomen is normal in size. Bowel sounds are normal. He is not tender in the epigastrium today. Arms: Muscle size and  bulk are normal for age. Hands: There is no obvious tremor. Phalangeal and metacarpophalangeal joints are normal. Palmar muscles are normal for age. Palmar skin is normal. Palmar moisture is also normal. Legs: Muscles appear normal for age. No edema is present. Neurologic: Strength is normal for age in both the upper and lower extremities. Muscle tone is normal. Sensation to touch is normal in both legs.   Psych: He is still concerned about his GI problems and many other things.    LAB DATA:   Results for orders placed or performed in visit on 09/15/16 (from the past 504 hour(s))  Hemoglobin A1c   Collection Time: 12/29/16  3:06 PM  Result Value Ref Range   Hgb A1c MFr Bld 5.1 <5.7 %   Mean Plasma Glucose 100 mg/dL  TSH   Collection Time: 12/29/16  3:06 PM  Result Value Ref Range   TSH 2.24 0.50 - 4.30 mIU/L  T4, free   Collection Time: 12/29/16  3:06 PM  Result Value Ref Range   Free T4 1.3 0.8 - 1.4 ng/dL  T3, free   Collection Time: 12/29/16  3:06 PM  Result Value Ref Range   T3, Free 3.9 3.0 - 4.7 pg/mL  Estradiol   Collection Time: 12/29/16  3:06 PM  Result Value Ref Range   Estradiol <15 <=39 pg/mL  Follicle stimulating hormone   Collection Time: 12/29/16  3:06 PM  Result Value Ref Range   FSH 2.2 1.6 - 8.0 mIU/mL  Luteinizing hormone   Collection Time: 12/29/16  3:06 PM  Result Value Ref Range  LH 2.4 1.5 - 9.3 mIU/mL  Testosterone Total,Free,Bio, Males   Collection Time: 12/29/16  3:06 PM  Result Value Ref Range   Testosterone 204 (L) 250 - 827 ng/dL   Albumin 4.2 3.6 - 5.1 g/dL   Sex Hormone Binding 49 10 - 50 nmol/L   Testosterone, Free See below 46.0 - 224.0 pg/mL   Testosterone, Bioavailable SEE NOTE 110.0 - 575.0 ng/dL  Insulin-like growth factor   Collection Time: 12/29/16  3:06 PM  Result Value Ref Range   IGF-I, LC/MS 262 108 - 548 ng/mL   Z-Score (Male) -0.1 -2.0 - 2.0 SD  Igf binding protein 3, blood   Collection Time: 12/29/16  3:06 PM  Result  Value Ref Range   IGF Binding Protein 3 5.9 2.9 - 7.3 mg/L   12/29/16: HbA1c 5.1%; TSH 2.24, free T4 1.59; , free T3 39; IGF-1 262, IGFBP-3 5.9 (ref 2.9-7.3); LH 2.4, FSH 2.2, testosterone 204, estradiol <15   07/11/16: HbA1c 5.4%  05/25/16: TSH 1.561  05/24/16: HbA1c 5.4%, TSH 1.055  05/23/16: As above  05/18/16: LH 0.6, FSH 1.5, testosterone 23; TSH 1.73, free T4 1.0, free T3 2.9; IGF-1 79, IGFBP-3 4.0  04/05/16: Celiac panel and IgA both normal  02/23/16: HbA1c 5.7%  01/05/16: Urine showed small bilirubin; CMP normal, except for glucose 105 after vomiting; CBC with WBC 11.2;   11/25/15: HbA1c 5.6%  09/26/15: HbA1c 5.8%; IGF-1 110, IGFBP-3 4.0; LH 0.4, FSH 1.9, testosterone 30, free testosterone 3.6, estradiol 14.6; TSH 2.315, TPO antibody <1  06/18/15: Chromosomes 46, XY  06/09/15: HbA1c 5.8%; TSH 1.906, free T4 0.94, free T3 2.8; testosterone 24; IGF-1 110; IGFBP-3 5.0  02/27/15: HbA1c 5.7%; TSH 3.443, free T4 0.74, free T3 3.0; testosterone 29  10/28/14: Testosterone 21, IGF-1 119  08/14/14: TSH 2.835, free T4 0.97, free T3 3.6; LH 0.6, FSH 1.6, testosterone 18, estradiol < 11.8; IGF-1 108, IGFBP-3 3.8  06/18/14: testosterone 27, prolactin 3.8; TSH 3.106, free T4 0.93, free T3 3.0  11/20/13: TSH 0.5, free T4 0.99, free T3 3.4; LH 0.5, FSH 3.1, testosterone 18, estradiol < 11.8  07/15/13: CBC normal; CMP normal;   05/22/13: CMP normal; TSH 2.46, free T4 1.06, free T3 3.3, TPO antibody 11.2; FSH 2.2, LH 0.3, testosterone 63, estradiol 15.6  IMAGING:  Bone age 48/21/17: Bone age was read as 14 years at a chronologic age of 81 years and 7 months. I read the bone age as 49-6.   Bone age 48/23/14: Bone age 18 years at chronologic age 32 years, 10 months.   MRI of head 05/30/13: Normal pituitary gland and brain.   Assessment and Plan:   ASSESSMENT:  1. Puberty delay: His recent testosterone values indicate that Dequarius's progress through puberty has resumed. I suspect that his GI  problems may still be impeding his pubertal progress.  2. Growth delay, linear: His height growth is plateauing.   3. Overweight: He has lost significant amounts of fat weight, but not in a healthy way.   4-7. Nausea/reflux/dyspepsia/abdominal pain:   A. The nausea and other symptoms are better, but are still very problematic for him.   B. Neither Garald nor mom want to return to [facility]. I suggest a referral to Dr. Nadine Counts Beard at [facility]. 8-10. Goiter/thyroiditis/hypothyroid:   A. His thyroid gland has increased in size a bit since his last visit. The process of waxing and waning of thyroid gland size is c/w slowly evolving Hashimoto's thyroiditis.  His thyroiditis is clinically quiescent  today.   B. His TFTs in July were mid-normal on his current dose of Synthroid. His TFTs in March 2018 are within normal, but his TSH is outside of the ideal range of 1.0-2.0. He needs a small increase in Synthroid dosage.  10. Acanthosis: He has acquired acanthosis, due to hyperinsulinemia, which in turn was caused by the resistance to insulin resulting from the excessive production of cytokines by overly fat adipose cells. This problem has improved with loss of fat weight.   11. Hypertension: His BP is normal.   12. Prediabetes: His HbA1c is lower at 5.1%, c/w his loss of fat weight.     PLAN:   1. Diagnostic: I reviewed current lab results and clinical course.   2. Therapeutic: Continue current medications but take one 25 mcg Synthroid pill per day for 5 days each week, but take 1.5 pills on two days each week. I recommend referral to Dr. Matthias Hughs in [facility]. 3. Patient education: We spent the majority of the visit discussing his GI problems. We also briefly discussed his other issues, to include puberty delay, growth delay, insulin resistance, hyperinsulinemia, goiter, and autoimmune thyroid disease at length. We also discussed his improvement clinically and chemically in terms of his puberty delay. The fact  that he has entered puberty is a good sign, but we will not know for quite some time how far he will progress through puberty and how much testosterone he will be capable of producing as an adult.  4. Follow-up: 3 months. Consider repeating his puberty and growth studies then.   Level of Service: This visit lasted in excess of 65 minutes. More than 50% of the visit was devoted to counseling.  Molli Knock, MD, CDE Adult and Pediatric Endocrinology

## 2017-01-05 NOTE — Patient Instructions (Signed)
Follow up visit in 3 months. Please change Synthroid as follows: Take one 25 mcg tablet daily for 5 days each week, but take 1.5 tablets daily for 2 days each week.

## 2017-01-10 ENCOUNTER — Telehealth (INDEPENDENT_AMBULATORY_CARE_PROVIDER_SITE_OTHER): Payer: Self-pay | Admitting: "Endocrinology

## 2017-01-10 NOTE — Telephone Encounter (Signed)
°  Who's calling (name and relationship to patient) : Marylene Landngela, mother Best contact number: [phone] Provider they see: Fransico MichaelBrennan Reason for call: Dr Fransico MichaelBrennan advised patient to see Dr Matthias HughsBuccini with [facility], but he is not accepting new patients.     PRESCRIPTION REFILL ONLY  Name of prescription:  Pharmacy:

## 2017-01-11 NOTE — Telephone Encounter (Signed)
Routed to provider

## 2017-01-12 ENCOUNTER — Other Ambulatory Visit (INDEPENDENT_AMBULATORY_CARE_PROVIDER_SITE_OTHER): Payer: Self-pay | Admitting: *Deleted

## 2017-01-12 DIAGNOSIS — G43A0 Cyclical vomiting, not intractable: Secondary | ICD-10-CM

## 2017-01-12 NOTE — Telephone Encounter (Signed)
LVM, advised that per Dr. Fransico MichaelBrennan a referral to [facility] has been sent.

## 2017-01-24 ENCOUNTER — Telehealth (INDEPENDENT_AMBULATORY_CARE_PROVIDER_SITE_OTHER): Payer: Self-pay

## 2017-01-24 ENCOUNTER — Other Ambulatory Visit (INDEPENDENT_AMBULATORY_CARE_PROVIDER_SITE_OTHER): Payer: Self-pay

## 2017-01-24 DIAGNOSIS — E300 Delayed puberty: Secondary | ICD-10-CM

## 2017-01-24 NOTE — Telephone Encounter (Signed)
Called mom about labs and letting her know to get them drawn one week before he next appointment

## 2017-01-24 NOTE — Telephone Encounter (Signed)
  Who's calling (name and relationship to patient) :mom; Lennox LaityAngela  Best contact number:[phone]  Provider they ZOX:WRUEAVWsee:Brennan  Reason for call:Mom wants to know about labs for thyroid. Mom also wants to know if labs are needed before next visit on 04/17/2017.     PRESCRIPTION REFILL ONLY  Name of prescription:  Pharmacy:

## 2017-02-07 ENCOUNTER — Encounter: Payer: Self-pay | Admitting: Physician Assistant

## 2017-02-07 ENCOUNTER — Ambulatory Visit (INDEPENDENT_AMBULATORY_CARE_PROVIDER_SITE_OTHER): Payer: Medicaid Other | Admitting: Physician Assistant

## 2017-02-07 VITALS — BP 110/70 | HR 76 | Ht 66.0 in | Wt 144.2 lb

## 2017-02-07 DIAGNOSIS — R110 Nausea: Secondary | ICD-10-CM

## 2017-02-07 MED ORDER — PROMETHAZINE HCL 25 MG PO TABS: 25.0000 mg | ORAL_TABLET | Freq: Four times a day (QID) | ORAL | 0 refills | Status: DC | PRN

## 2017-02-07 MED ORDER — OMEPRAZOLE 20 MG PO CPDR: 20.0000 mg | DELAYED_RELEASE_CAPSULE | Freq: Every day | ORAL | 8 refills | Status: DC

## 2017-02-07 MED ORDER — ONDANSETRON 8 MG PO TBDP: 8.0000 mg | ORAL_TABLET | Freq: Three times a day (TID) | ORAL | 6 refills | Status: DC | PRN

## 2017-02-07 NOTE — Progress Notes (Signed)
Agree with assessment and plan as outlined.  Extensive workup for chronic symptoms including CT abdomen / pelvis, upper endoscopy, gastric emptying study, urine porphyrins all negative for a source. 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 On multiple psychotropic drugs, of note one of them being Elavil. Higher dose of Elavil if no contraindications from psychiatry could provide additional benefit for this issue. Otherwise continue antiemetics PRN and I can see him as needed if no improvement.

## 2017-02-07 NOTE — Progress Notes (Signed)
Subjective:    Patient ID: Charles Beard, male    DOB: [birth year], 21 y.o.   MRN: [id number]  HPI Charles Beard is a 21 year old white male known to Dr. Adela Lank and myself who has previous diagnosis of cyclic vomiting syndrome. He was last seen in our office in June 2017 and was hospitalized shortly thereafter. He underwent workup with EGD per Dr. Rhea Belton which was normal with the exception of small hiatal hernia, gastric emptying scan was also normal. 24 hour urine for porphyrins was normal. Drug screen at that time positive for [facility] and it was felt that he may have cannabis related trigger for his cyclic vomiting. He was advised to stop using marijuana. Patient also has several other comorbidities including diagnosis of hypothyroidism, delayed puberty, major depressive disorder, ADHD, generalized anxiety disorder, and hypertension. He is followed by an endocrinologist and a neuropsychologist. He is on several psychotropic medications. He has not required hospitalization since July 2017. He and his mother both state that he is not having episodes of intractable vomiting however he continues to have daily early morning nausea which she says is always present when he wakes up. If he takes a Zofran and then eats breakfast he generally feels better for the rest of the day. He is not having any regular heartburn or indigestion and has no complaints of abdominal pain. In his mother state he is not using marijuana at this time. His mother has multiple concerns about his other medications and feels that he's too sedated most of the time. He was started on amitriptyline 25 mg daily at bedtime last summer, but is also on Tegretol, and Strattera as well as Intuniv. They have appointment with the psychologist to discuss his other medications tomorrow.  Review of Systems Pertinent positive and negative review of systems were noted in the above HPI section.  All other review of systems was otherwise negative.  Outpatient  Encounter Prescriptions as of 02/07/2017  Medication Sig  . amitriptyline (ELAVIL) 25 MG tablet Take 1 tablet (25 mg total) by mouth at bedtime.  Marland Kitchen atomoxetine (STRATTERA) 25 MG capsule Take 25 mg by mouth daily.  . calcium carbonate (OS-CAL) 600 MG TABS tablet Take 600 mg by mouth every evening. Reported on 04/05/2016  . carbamazepine (TEGRETOL XR) 100 MG 12 hr tablet Take 100 mg by mouth 2 (two) times daily. Patient taking on as needed basis  . guanFACINE (INTUNIV) 2 MG TB24 ER tablet Take 2 mg by mouth at bedtime.  Marland Kitchen levothyroxine (SYNTHROID) 25 MCG tablet Take one 25 mcg tablet per day for 5 days each week, but take 1.5 tablets per day on two days each week  . loratadine (CLARITIN) 10 MG tablet Take 10 mg by mouth daily as needed for allergies. Reported on 04/05/2016  . Melatonin 5 MG TABS Take 5 mg by mouth daily.  . Multiple Vitamin (MULTIVITAMIN WITH MINERALS) TABS tablet Take 1 tablet by mouth daily.  . ondansetron (ZOFRAN-ODT) 8 MG disintegrating tablet Take 1 tablet (8 mg total) by mouth every 8 (eight) hours as needed for nausea or vomiting.  . promethazine (PHENERGAN) 25 MG tablet Take 1 tablet (25 mg total) by mouth every 8 (eight) hours as needed for nausea or vomiting. (Patient taking differently: Take 25 mg by mouth every 6 (six) hours as needed for nausea or vomiting. )  . [DISCONTINUED] omeprazole (PRILOSEC) 40 MG capsule Take 1 capsule (40 mg total) by mouth 2 (two) times daily.  . [DISCONTINUED] ondansetron (ZOFRAN-ODT) 8  MG disintegrating tablet Take 8 mg by mouth every 8 (eight) hours as needed for nausea or vomiting.  Marland Kitchen omeprazole (PRILOSEC) 20 MG capsule Take 1 capsule (20 mg total) by mouth daily.  . promethazine (PHENERGAN) 25 MG tablet Take 1 tablet (25 mg total) by mouth every 6 (six) hours as needed for nausea or vomiting. 1/2 tablet  . [DISCONTINUED] carbamazepine (TEGRETOL XR) 200 MG 12 hr tablet Take 200 mg by mouth 2 (two) times daily. Reported on 04/05/2016  .  [DISCONTINUED] escitalopram (LEXAPRO) 20 MG tablet Take 20 mg by mouth daily. Reported on 04/05/2016  . [DISCONTINUED] guanFACINE (INTUNIV) 1 MG TB24 Take 1 mg by mouth daily. Reported on 04/05/2016  . [DISCONTINUED] Melatonin 10 MG TABS Take 10 mg by mouth at bedtime.  . [DISCONTINUED] ondansetron (ZOFRAN ODT) 4 MG disintegrating tablet Take 2 tablets (8 mg total) by mouth every 8 (eight) hours as needed for nausea or vomiting.   No facility-administered encounter medications on file as of 02/07/2017.    Allergies  Allergen Reactions  . Reglan [Metoclopramide] Other (See Comments)    Seizure like activity   Patient Active Problem List   Diagnosis Date Noted  . Abdominal pain, chronic, epigastric 07/12/2016  . Esophageal reflux 05/24/2016  . Nausea & vomiting 05/24/2016  . Intractable nausea and vomiting 05/24/2016  . Cyclical vomiting with nausea 05/24/2016  . Elevated hemoglobin A1c 03/12/2015  . Essential hypertension, benign 03/12/2015  . 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 Dyspepsia 11/21/2013  . 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 MDD (major depressive disorder), single episode, severe , no psychosis (HCC) 06/15/2013  . Suicide (HCC) 06/15/2013  . ADHD (attention deficit hyperactivity disorder), combined type 06/15/2013  . GAD (generalized anxiety disorder) 06/15/2013  . Puberty delay 05/23/2013  . Delayed linear growth 05/23/2013  . Overweight peds (BMI 85-94.9 percentile) 05/23/2013  . Goiter 05/23/2013  . Acanthosis nigricans, acquired 05/23/2013  . Gynecomastia, male 05/23/2013   Social History   Social History  . Marital status: Single    Spouse name: N/A  . Number of children: 0  . Years of education: N/A   Occupational History  . Not on file.   Social History Main Topics  . Smoking status: Never Smoker  . Smokeless tobacco: Never Used  . Alcohol use No  . Drug use: No  . Sexual activity: No     Comment: father smokes   Other Topics  Concern  . Not on file   Social History Narrative   Lives at home with mom and 2 cats, visits dad every other weekend, will attend [employer], will start 9th grade in the fall.    Mr. Purohit family history includes Breast cancer in his other; Diabetes in his maternal grandmother; Heart disease in his maternal grandmother; Thyroid disease in his mother and other.      Objective:    Vitals:   02/07/17 1408  BP: 110/70  Pulse: 76    Physical Exam  well-developed young white male in no acute distress, accompanied by his mother who offers most of the history blood pressure 110/70 pulse 76, height 5 foot 6, weight 144, BMI of 23.2. HEENT ;nontraumatic normocephalic EOMI PERRLA sclera anicteric, Cardiovascular; regular rate and rhythm with S1-S2, Pulmonary; clear bilaterally, Abdomen; soft nontender nondistended bowel sounds are active there is no palpable mass or hepatosplenomegaly, Extremities ;no clubbing cyanosis or edema skin warm and dry, Neuropsych; mood and affect appropriate today and he makes good eye contact.  Assessment & Plan:   #61 21 year old white male with chronic daily nausea always present early morning on awakening and resolved later in day. Currently controlled with Zofran 8 mg ODT every morning. It is unclear whether his nausea is related to any underlying GI etiology. Suspect multiple other medications contributing #2 previous diagnosis  cyclic vomiting syndrome-however he is not vomiting on any sort of regular basis over the past several months, and is abstaining from cannabis #3 ADHD #4 history of major depressive disorder #5 delayed puberty #6 hypothyroidism  #7 generalized anxiety disorder  Plan; Decrease omeprazole to 20 mg by mouth every morning Continue Zofran 8 mg ODT 1 by mouth every morning Have given a very limited supply of Phenergan 12.5 mg to be used only for refractory nausea or vomiting not controlled by Zofran and encouraged his  mother not to give him Phenergan Spoke to patient directly about continuing abstinence from cannabis and how this has been definitely associated as a trigger for cyclic vomiting syndrome. I do have some concerns about his mother and somewhat noncompliant with her dosing of some of his medications. Patient will follow-up with Dr. Adela Lank or myself in 6 months or sooner if needed.  Delisha Peaden S Tache Bobst [name] 02/07/2017   Cc: Nonnie Done., MD

## 2017-02-07 NOTE — Patient Instructions (Signed)
We have sent the following medications to your pharmacy for you to pick up at your convenience:  Zofran 8 mg take one every morning.   Phenergan 25 mg 1/2 tab every 8 hours as needed for severe nausea.   Prilosec 20 mg every morning.

## 2017-04-07 LAB — TESTOSTERONE TOTAL,FREE,BIO, MALES
Albumin: 4.6 g/dL (ref 3.6–5.1)
Sex Hormone Binding: 57 nmol/L — ABNORMAL HIGH (ref 10–50)
TESTOSTERONE: 230 ng/dL — AB (ref 250–827)

## 2017-04-07 LAB — HEMOGLOBIN A1C
Hgb A1c MFr Bld: 5.5 % (ref <5.7)
Mean Plasma Glucose: 111 mg/dL

## 2017-04-07 LAB — T3, FREE: T3 FREE: 3.7 pg/mL (ref 3.0–4.7)

## 2017-04-07 LAB — TSH: TSH: 2.28 m[IU]/L (ref 0.50–4.30)

## 2017-04-07 LAB — T4, FREE: FREE T4: 1.3 ng/dL (ref 0.8–1.4)

## 2017-04-10 LAB — IGF BINDING PROTEIN 3, BLOOD: IGF Binding Protein 3: 6.7 mg/L (ref 2.9–7.3)

## 2017-04-10 LAB — INSULIN-LIKE GROWTH FACTOR
IGF-I, LC/MS: 368 ng/mL (ref 108–548)
Z-Score (Male): 0.8 SD (ref ?–2.0)

## 2017-04-17 ENCOUNTER — Ambulatory Visit (INDEPENDENT_AMBULATORY_CARE_PROVIDER_SITE_OTHER): Payer: Medicaid Other | Admitting: "Endocrinology

## 2017-05-11 ENCOUNTER — Ambulatory Visit (INDEPENDENT_AMBULATORY_CARE_PROVIDER_SITE_OTHER): Payer: Medicaid Other | Admitting: "Endocrinology

## 2017-05-11 ENCOUNTER — Encounter (INDEPENDENT_AMBULATORY_CARE_PROVIDER_SITE_OTHER): Payer: Self-pay | Admitting: "Endocrinology

## 2017-05-11 VITALS — BP 110/60 | HR 70 | Ht 67.21 in | Wt 133.0 lb

## 2017-05-11 DIAGNOSIS — R634 Abnormal weight loss: Secondary | ICD-10-CM | POA: Diagnosis not present

## 2017-05-11 DIAGNOSIS — R7303 Prediabetes: Secondary | ICD-10-CM | POA: Diagnosis not present

## 2017-05-11 DIAGNOSIS — R110 Nausea: Secondary | ICD-10-CM | POA: Diagnosis not present

## 2017-05-11 DIAGNOSIS — E063 Autoimmune thyroiditis: Secondary | ICD-10-CM

## 2017-05-11 DIAGNOSIS — F329 Major depressive disorder, single episode, unspecified: Secondary | ICD-10-CM | POA: Diagnosis not present

## 2017-05-11 DIAGNOSIS — I10 Essential (primary) hypertension: Secondary | ICD-10-CM

## 2017-05-11 DIAGNOSIS — E300 Delayed puberty: Secondary | ICD-10-CM

## 2017-05-11 DIAGNOSIS — F419 Anxiety disorder, unspecified: Secondary | ICD-10-CM

## 2017-05-11 DIAGNOSIS — E049 Nontoxic goiter, unspecified: Secondary | ICD-10-CM

## 2017-05-11 DIAGNOSIS — F32A Depression, unspecified: Secondary | ICD-10-CM

## 2017-05-11 MED ORDER — LEVOTHYROXINE SODIUM 25 MCG PO TABS: ORAL_TABLET | ORAL | 5 refills | Status: DC

## 2017-05-11 NOTE — Patient Instructions (Signed)
Follow up visit in 3 months. Please change Synthroid to 1.5 of the 25 mcg tablets per day for 3 days each week, but take only one tablet per day for 4 days each week.

## 2017-05-11 NOTE — Progress Notes (Signed)
Subjective:  Patient Name: Charles Beard Date of Birth: [birth year]  MRN: [id number]  Charles Beard  presents to the office today for follow up evaluation and management of his puberty delay, obesity, gynecomastia, linear growth delay/short stature, nausea and abdominal pains, and unintentional short stature.   HISTORY OF PRESENT ILLNESS:   Charles Beard is a 21 y.o. Caucasian young man.   Hanz was accompanied by his mother.  1. Charles Beard's initial [facility] pediatric endocrine consultation was on 05/22/13 at age 67.  A. [name]: Term, emergency C-section for failure to progress. Birth weight: 7 lbs-15 oz. Healthy newborn.  B. Infancy: Healthy  C. Childhood: ADHD was diagnosed about age 4. Charles Beard was followed at [facility] and [facility]. Stimulants and non-stimulants did not work.  He also had severe anger issues. He sometimes bit himself or hit things, but had not hurt himself seriously. He was hit in the forehead by a pitched baseball at about age 41-8. There was no loss of consciousness. His only surgery was curettage of plantar warts. He had seasonal allergies, but no medication allergies.    D. Obesity: Mom had been concerned about his weight gain for several years. Mom tried to get him to eat healthy. According to mom, dad fed him whatever he wanted. Neither dad nor Biff exercised.   E. Puberty delay: He had no axillary hair, pubic hair, or genital development.  F. Pertinent family history: Strong FH of obesity in mom and  maternal relatives. FH of T2DM in maternal grandmother. No FH of delayed puberty. Hypothyroidism in mom, MGM, MGGM. Neither mom nor MGM had thyroid surgery or irradiation. Menopause at age 910 in mom, MGM, and MGGM. Mental health issues on dad's side of family. Dad had severe mood swings. Dad was an alcoholic and PGF was an alcoholic. [Addendum 11/18/14: Mom stated that dad also used drugs. Mom and dad divorced when Journee was age 89. Mom also had depression.] [Addendum 05/11/17:  Charles Beard accused his mother of frequently getting drunk at night. Mom said that she sometimes had two glasses of wine at night, but not every night. Charles Beard said that she drank a full bottle of wine each night. ]  G. On exam, his BP was elevated at 122/87. He was quite agitated during the visit, supposedly because he was angry that mom was sharing his personal health information with yet another person. He was significantly overweight. He had a 19-20 gram goiter. His breasts were fatty with a Tanner stage 3 appearance and enlarged areolae, but no breast buds. His abdomen was enlarged. His pubic hair was Tanner stage 1. His testes were 2-3 mL in volume, very early pubertal. Lab tests showed normal CMP,TFTs, and TPO antibody levels. His FSH was 2.2, LH 0.3,  testosterone 63, and estradiol 15.4, all c/w being in early puberty.  H. I diagnosed pubertal delay, linear growth delay, early gynecomastia, overweight, dyspepsia, goiter, and acanthosis. I instructed Dabid and his mother on our Eat Right Diet and proper exercise techniques. I also started him on ranitidine, 150 mg, twice daily.   2. During the past four years Charles Beard has had slow progression of puberty. As he has grown older and has had adjustments in his psychiatric medications, he has also become more emotionally stable and less emotionally reactive. 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 He has had recurrent problems with cyclical vomiting syndrome, nausea, and abdominal pains. In the past two years he has  been progressively losing weight.   3. Charles Beard's last [facility] visit was on 01/05/17. In the interim he has not had any new health issues or problems.   A. He saw [name], PA, at [facility] again on 02/07/17. She decreased his omeprazole to 20 mg each morning, reduced the Phenergan to 12.5 mg/day as needed, and continued his Zofran 8 mg, ODT every morning. [name] feels that his nausea is still the  same. He no longer has many abdominal pains. He has BMs soon after eating at a restaurant.    B. He is always hungry, to include snacking in the middle of the night.  He has not been walking and riding his bike much.    C. He stopped taking Strattera due to "feeling weird". He will start on Focalin soon. He takes his carbamazepine, but his escitalopram was discontinued. He remains on Synthroid, 25 mcg/day for 5 days each week, but 37.5 mcg/day for two days each week. Mom has to make him take the Synthroid.   [name] continues to see his psychiatrist, Dr. [name]. His separation anxiety is still a problem for him. Sometimes he still "freaks out". He has not been feeling depressed.   E. He had psychological testing in the past. According to mom, [name] was found to have "a little bit of a low IQ". ADD was also noted. An individual IEP was developed. He did not finish high school.   F. Mom complained that Charles Beard spends all of his time on his X-Box or on his cell phone. He does not want to spend mch time with her. When she tries to limit his tech time he gets angry. He stays up much of the night and sleeps in during much of the day. Mom is very upset by these behaviors, in part because she wants to have more interactive time with him. On the one hand she wants him to respect her and obey her as his mother. However, on the other hand she wants to interact with him on more of an adult friendship basis. Mom also complained that when she wants to spend time with her girl fiends, he has severe separation anxiety. [name] admitted to the separation anxiety problem. Mom also complained that Charles Beard never wants to leave the house.He then complained that when he wants to leave the house mm will not drive him whee he wants to go.    3.  Pertinent Review of Systems:  Constitutional: He feels "wonderful and hungry" today. He has been staying up late and getting up late. He still only eats about once a day.  Eyes: Vision seems  to be good, better if he wears his glasses. He had his last eye exam on 06/01/15 and had a new pair of eye glasses.There are no recognized eye problems. Mom has not yet scheduled him for a follow up eye exam.  Neck: The patient has no complaints of anterior neck swelling, soreness, tenderness, pressure, discomfort, or difficulty swallowing.   Heart: Heart rate increases with exercise or other physical activity. The patient has no complaints of palpitations, irregular heart beats, chest pain, or chest pressure.   Gastrointestinal: As above. Sometimes his stomach growls. Bowel movements are good for the most part.  Legs: Muscle mass and strength seem normal. There are no complaints of numbness, tingling, burning, or pain. No edema is noted.  Feet: There are no complaints of numbness, tingling, burning, or pain. No edema is noted. Neurologic: There are no recognized problems  with muscle movement and strength, sensation, or coordination. GU: He has more pubic hair and axillary hair. He says that his genitalia are larger. Voice is a little deeper.   PAST MEDICAL, FAMILY, AND SOCIAL HISTORY  Past Medical History:  Diagnosis Date  . ADHD (attention deficit hyperactivity disorder) 2000  . Anxiety   . Depression   . Headache(784.0)   . Hypothyroidism   . Prediabetes   . Puberty delay     Family History  Problem Relation Age of Onset  . Thyroid disease Mother   . Diabetes Maternal Grandmother        and MGGM  . Heart disease Maternal Grandmother        and MGGM  . Breast cancer Other        and MGGM  . Thyroid disease Other        MGGM  . Colon cancer Neg Hx      Current Outpatient Prescriptions:  .  carbamazepine (TEGRETOL XR) 100 MG 12 hr tablet, Take 100 mg by mouth 2 (two) times daily. Patient taking on as needed basis, Disp: , Rfl:  .  guanFACINE (INTUNIV) 2 MG TB24 ER tablet, Take 2 mg by mouth at bedtime., Disp: , Rfl:  .  levothyroxine (SYNTHROID) 25 MCG tablet, Take one 25 mcg  tablet per day for 5 days each week, but take 1.5 tablets per day on two days each week, Disp: 35 tablet, Rfl: 5 .  Melatonin 5 MG TABS, Take 5 mg by mouth daily., Disp: , Rfl:  .  Multiple Vitamin (MULTIVITAMIN WITH MINERALS) TABS tablet, Take 1 tablet by mouth daily., Disp: , Rfl:  .  omeprazole (PRILOSEC) 20 MG capsule, Take 1 capsule (20 mg total) by mouth daily., Disp: 30 capsule, Rfl: 8 .  ondansetron (ZOFRAN-ODT) 8 MG disintegrating tablet, Take 1 tablet (8 mg total) by mouth every 8 (eight) hours as needed for nausea or vomiting., Disp: 90 tablet, Rfl: 6 .  amitriptyline (ELAVIL) 25 MG tablet, Take 1 tablet (25 mg total) by mouth at bedtime. (Patient not taking: Reported on 05/11/2017), Disp: 30 tablet, Rfl: 0 .  atomoxetine (STRATTERA) 25 MG capsule, Take 25 mg by mouth daily., Disp: , Rfl:  .  calcium carbonate (OS-CAL) 600 MG TABS tablet, Take 600 mg by mouth every evening. Reported on 04/05/2016, Disp: , Rfl:  .  loratadine (CLARITIN) 10 MG tablet, Take 10 mg by mouth daily as needed for allergies. Reported on 04/05/2016, Disp: , Rfl:  .  promethazine (PHENERGAN) 25 MG tablet, Take 1 tablet (25 mg total) by mouth every 8 (eight) hours as needed for nausea or vomiting. (Patient not taking: Reported on 05/11/2017), Disp: 90 tablet, Rfl: 1  Allergies as of 05/11/2017 - Review Complete 05/11/2017  Allergen Reaction Noted  . Reglan [metoclopramide] Other (See Comments) 03/28/2016     reports that he has never smoked. He has never used smokeless tobacco. He reports that he does not drink alcohol or use drugs. Pediatric History  Patient Guardian Status  . Mother:  Terell, Kincy   Other Topics Concern  . Not on file   Social History Narrative   Lives at home with mom and 2 cats, visits dad every other weekend, will attend [employer], will start 9th grade in the fall.    1. School and Family: As above. He is no longer trying to finish high school online through a program in [place].  Denis will not return to public school. Dominion lives  with mom, but has an occasional visit with dad if mom sets it up. Dad does not make the effort to see [name].     2. Activities: His physical activities vary with his GI symptoms.  3. Primary Care Provider: Dr. Cheri RousJohn Slatosky in [place], [facility], phone [phone] 4. Psychiatrist: Dr. Joan FloresMojeed Adeniyi Akintayo 5. GI: Ms Mike Gipmy Esterwood, PA at [facility]   REVIEW OF SYSTEMS: There are no other significant problems involving Square's other body systems.   Objective:   BP 110/60   Pulse 70   Ht 5' 7.21" (1.707 m)   Wt 133 lb (60.3 kg)   BMI 20.70 kg/m    Ht Readings from Last 3 Encounters:  05/11/17 5' 7.21" (1.707 m) (20 %, Z= -0.85)*  02/07/17 5\' 6"  (1.676 m) (10 %, Z= -1.27)*  01/05/17 5' 6.42" (1.687 m) (13 %, Z= -1.12)*   * Growth percentiles are based on CDC 2-20 Years data.   Wt Readings from Last 3 Encounters:  05/11/17 133 lb (60.3 kg) (15 %, Z= -1.03)*  02/07/17 144 lb 3.2 oz (65.4 kg) (33 %, Z= -0.44)*  01/05/17 139 lb 9.6 oz (63.3 kg) (26 %, Z= -0.65)*   * Growth percentiles are based on CDC 2-20 Years data.   HC Readings from Last 3 Encounters:  No [facility] for [facility]   Body surface area is 1.69 meters squared. 20 %ile (Z= -0.85) based on CDC 2-20 Years stature-for-age data using vitals from 05/11/2017. 15 %ile (Z= -1.03) based on CDC 2-20 Years weight-for-age data using vitals from 05/11/2017.    PHYSICAL EXAM:  Constitutional:   A. The patient appears tired, but otherwise normal. He was alert and fairly bright. He engaged fairly well. His affect was still fairly flat, but he did exhibit humor several times during the visit. His insight was fair. His height has plateaued at the 16-19%.  He has lost 6 more pounds since last visit. His weight has decreased to the 15.1%. His BMI has decreased to the 20.14%.   B. Today's visit went very well initially, but as the visit continued the interactions between  UnadillaAaron and mom became progressively more negative and angrier. Head: The head is normocephalic. Face: The face appears normal. There are no obvious dysmorphic features or plethora. Eyes: The eyes appear to be normally formed and spaced. Gaze is conjugate. There is no obvious arcus or proptosis. Eye moisture is normal.  Ears: The ears are normally placed and appear externally normal. Mouth: The oropharynx and tongue appear normal. Dentition appears to be normal for age. Mouth is a bit dry.  There is no hyperpigmentation. He has a grade III fine, blond mustache.  Neck: The neck appears to be visibly enlarged. No carotid bruits are noted. The thyroid gland is still enlarged at about 21-22 grams in size. The right lobe has shrunk back to normal size, but the left lobe is larger today. The consistency of the thyroid gland is normal today. The thyroid gland is not  tender to palpation. He has trace-to-1+ acanthosis.  Lungs: The lungs are clear to auscultation. Air movement is good. Heart: Heart rate and rhythm are regular. Heart sounds S1 and S2 are normal. I did not appreciate any pathologic cardiac murmurs. Abdomen: The abdomen is normal in size. Bowel sounds are normal. He is not tender in the epigastrium today. Arms: Muscle size and bulk are normal for age. Hands: There is no obvious tremor. Phalangeal and metacarpophalangeal joints are normal. Palmar  muscles are normal for age. Palmar skin is normal. Palmar moisture is also normal. Fingernails are pale.  Legs: Muscles appear normal for age. No edema is present. Neurologic: Strength is normal for age in both the upper and lower extremities. His upper body strength has been gradually increasing over time. Muscle tone is normal. Sensation to touch is normal in both legs.  Psych: He is still concerned about his anxiety problem, his GI problems, and many other things.    LAB DATA:   No results found for this or any previous visit (from the past 504  hour(s)).   04/06/17: HbA1c 5.5%; TSH 2.28, free T4 1.2, free T3 3.7; testosterone 230; IGF-1 368, IGFBP-3 6.7 (ref 2.9-7.3)     12/29/16: HbA1c 5.1%; TSH 2.24, free T4 1.59; , free T3 39; IGF-1 262, IGFBP-3 5.9 (ref 2.9-7.3); LH 2.4, FSH 2.2, testosterone 204, estradiol <15   07/11/16: HbA1c 5.4%  05/25/16: TSH 1.561  05/24/16: HbA1c 5.4%, TSH 1.055  05/23/16: As above  05/18/16: LH 0.6, FSH 1.5, testosterone 23; TSH 1.73, free T4 1.0, free T3 2.9; IGF-1 79, IGFBP-3 4.0  04/05/16: Celiac panel and IgA both normal  02/23/16: HbA1c 5.7%  01/05/16: Urine showed small bilirubin; CMP normal, except for glucose 105 after vomiting; CBC with WBC 11.2;   11/25/15: HbA1c 5.6%  09/26/15: HbA1c 5.8%; IGF-1 110, IGFBP-3 4.0; LH 0.4, FSH 1.9, testosterone 30, free testosterone 3.6, estradiol 14.6; TSH 2.315, TPO antibody <1  06/18/15: Chromosomes 46, XY  06/09/15: HbA1c 5.8%; TSH 1.906, free T4 0.94, free T3 2.8; testosterone 24; IGF-1 110; IGFBP-3 5.0  02/27/15: HbA1c 5.7%; TSH 3.443, free T4 0.74, free T3 3.0; testosterone 29  10/28/14: Testosterone 21, IGF-1 119  08/14/14: TSH 2.835, free T4 0.97, free T3 3.6; LH 0.6, FSH 1.6, testosterone 18, estradiol < 11.8; IGF-1 108, IGFBP-3 3.8  06/18/14: testosterone 27, prolactin 3.8; TSH 3.106, free T4 0.93, free T3 3.0  11/20/13: TSH 0.5, free T4 0.99, free T3 3.4; LH 0.5, FSH 3.1, testosterone 18, estradiol < 11.8  07/15/13: CBC normal; CMP normal;   05/22/13: CMP normal; TSH 2.46, free T4 1.06, free T3 3.3, TPO antibody 11.2; FSH 2.2, LH 0.3, testosterone 63, estradiol 15.6  IMAGING:  Bone age 48/21/17: Bone age was read as 14 years at a chronologic age of 32 years and 7 months. I read the bone age as 78-6.   Bone age 33/23/14: Bone age 13 years at chronologic age 30 years, 10 months.   MRI of head 05/30/13: Normal pituitary gland and brain.   Assessment and Plan:   ASSESSMENT:  1. Puberty delay: His recent testosterone value in June 2018  indicates that Jeanluc is progressing through puberty, albeit slowly. I suspect that his GI problems may still be impeding his pubertal progress.  2. Growth delay, linear: His height growth is plateauing, despite an increase in his IGF-1 and IGFBP-3 levels. His epiphyses may be closing earlier than his bone age study in April 2014 would have suggested. He may also not be taking in enough calories to support his growth.    3-4. Overweight/unintentional weight loss: He is still losing weight, in part because he is not as hungry and in part due to his GI problems. He does not appear to be anorexic.   5-8. Nausea/reflux/dyspepsia/abdominal pain:   A. The nausea is essentially unchanged, but the other symptoms are better.   B. Neither Kleber nor mom want to return to [facility]. I suggested a referral to  Dr. Nadine Counts Buccini at [facility], but he is not taking on any more patients. I suggested Dr. Kinnie Scales at [facility] and Dr. Laural Benes and [facility].  9-11. Goiter/thyroiditis/hypothyroid:   A. His thyroid gland has remained the same size, although the lobes have shifted in size since his last visit. The process of waxing and waning of thyroid gland size is c/w slowly evolving Hashimoto's thyroiditis.  His thyroiditis is clinically quiescent today.   B. His TFTs in July 2017 were mid-normal on his current dose of Synthroid. His TFTs in March 2018 and June 2018 were within normal, but his TSH at both times was outside of the ideal range of 1.0-2.0. He needs a small increase in Synthroid dosage.  10. Acanthosis: He has acquired acanthosis, due to hyperinsulinemia, which in turn was caused by the resistance to insulin resulting from the excessive production of cytokines by overly fat adipose cells. This problem has improved with loss of fat weight.   11. Hypertension: His BP is normal.   12. Prediabetes: His HbA1c is higher at 5.5%, despite his weight loss. We need to continue to follow this issue over time. Given  the autoimmune nature of his thyroiditis and hypothyroidism, it is possible that he could be slowly developing T1DM.   PLAN:   1. Diagnostic: I reviewed current lab results and clinical course.  I ordered a C-peptide, testosterone, IGF-1, and TFTs to be done before his next visit.   2. Therapeutic: Change Synthroid to 1.5 of the 25 mcg tab lest per day for three days each week, but take only one 25 mcg tablet/day for the other 4 days each week.  3. Patient education: We spent part of the visit discussing his GI problems. However, we spent most of the visit discussing the social interactions at home and mom's and Dondre's differing perceptions about those interactions. Unfortunately, although I tried to help each of them see the other person's point of view, neither Martese nor mom were willing to do so. They need counseling.  4. Follow-up: 3 months. Consider repeating his puberty and growth studies then.   Level of Service: This visit lasted in excess of 80 minutes. More than 50% of the visit was devoted to counseling.  Molli Knock, MD, CDE Adult and Pediatric Endocrinology

## 2017-06-08 ENCOUNTER — Telehealth (INDEPENDENT_AMBULATORY_CARE_PROVIDER_SITE_OTHER): Payer: Self-pay | Admitting: "Endocrinology

## 2017-06-08 NOTE — Telephone Encounter (Signed)
°  Who's calling (name and relationship to patient) : Marylene Landngela (mom) Best contact number: [phone] Provider they see: Fransico MichaelBrennan  Reason for call: Mom was calling for a referral to [facility] phone#[phone].  Please call.     PRESCRIPTION REFILL ONLY  Name of prescription:  Pharmacy:

## 2017-06-14 ENCOUNTER — Encounter (INDEPENDENT_AMBULATORY_CARE_PROVIDER_SITE_OTHER): Payer: Self-pay | Admitting: "Endocrinology

## 2017-06-14 NOTE — Telephone Encounter (Signed)
°  Who's calling (name and relationship to patient) : Marylene Landngela (mom) Best contact number: [phone] Provider they see: Fransico MichaelBrennan Reason for call: Mom was calling for a referral to be sent over to [facility]. Dr Fransico MichaelBrennan gave her a list of GI doctors and she want the referral to go to [facility]. (Dr Fransico MichaelMogod) Please call her when the referral is sent.      PRESCRIPTION REFILL ONLY  Name of prescription:  Pharmacy:

## 2017-06-15 ENCOUNTER — Other Ambulatory Visit (INDEPENDENT_AMBULATORY_CARE_PROVIDER_SITE_OTHER): Payer: Self-pay

## 2017-06-26 ENCOUNTER — Ambulatory Visit: Payer: Self-pay | Admitting: Physician Assistant

## 2017-06-29 ENCOUNTER — Encounter: Payer: Self-pay | Admitting: Gastroenterology

## 2017-06-29 ENCOUNTER — Ambulatory Visit (INDEPENDENT_AMBULATORY_CARE_PROVIDER_SITE_OTHER): Payer: Medicaid Other | Admitting: Gastroenterology

## 2017-06-29 VITALS — BP 102/80 | HR 72 | Ht 66.0 in | Wt 130.7 lb

## 2017-06-29 DIAGNOSIS — R110 Nausea: Secondary | ICD-10-CM | POA: Diagnosis not present

## 2017-06-29 NOTE — Progress Notes (Signed)
Agree with assessment as planned.  I have co-signed his notes in the past but have never met him. Extensive prior evaluation without a clear cause. In looking through his medication list, he has been on Tegretol (causes chronic nausea in upwards of 30% of patients) and Intunive (chronic nausea in up to 7% of patients). Not sure how long he has been on Tegretol but this could be a cause. He should follow up with the prescribing physician for his Tegretol to discuss other options, if his nausea is that severe and prohibiting him from work. I agree with a trial of TCA in the interim and titrate up as he tolerates. We can send a copy of this note to DSS for the documentation the mother is requesting.   Almyra Free can you please send a copy of this note to the patient's mother. I can see him in a follow up visit for reassessment of symptoms pending his course, but please relay to them it is possible one of the medications as outlined above could be associated. Thanks

## 2017-06-29 NOTE — Patient Instructions (Signed)
If you are age 20 or older, your body mass index should be between 23-30. Your Body mass index is 21.1 kg/m. If this is out of the aforementioned range listed, please consider follow up with your Primary Care Provider.  If you are age 20 or younger, your body mass index should be between 19-25. Your Body mass index is 21.1 kg/m. If this is out of the aformentioned range listed, please consider follow up with your Primary Care Provider.   Start taking Elavil 25 mg at bedtime.  Call with an update in 10 days.  We will be in touch after I speak with Dr. Adela LankArmbruster.  Thank you for choosing me and [facility].   Doug SouJessica Zehr, [name]

## 2017-06-29 NOTE — Progress Notes (Signed)
06/29/2017 Charles Beard [id number] 1996-02-23   HISTORY OF PRESENT ILLNESS:  This is a 21 year old male with chronic constant nausea.  He has undergone extensive evaluation with no cause identified. 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  This is suspected to be functional. Zofran does help some.  Please see note from 02/07/2017 for summary of all his previous workup, etc.  Patient's mother is wanting a note to send to DSS in order to continue their food stamps since the patient is not able to work.  She says that he tried to work for 5 weeks, but called her every day wanting her to come pick him up due to nausea, vomiting, and having to use the bathroom frequently after eating. The bowel issues have never been addressed previously, but he denies diarrhea, saying that his stools are always normal, just frequent.  Last note from his pediatric endocrinologist, Dr. Delphina Cahill, indicates that they were not satisfied with his care here and that they no longer wanted to be seen here. They were trying to get in to be seen with [facility] or Dr. Troy Sine.  He has Elavil on his medication left list, but has not been taking it.  His mother says that it was given to him at the hospital for treatment of his chest pain that was due to his hiatal hernia. He has only a very small hiatal hernia noted on testing.  Past Medical History:  Diagnosis Date  . ADHD (attention deficit hyperactivity disorder) 2000  . Anxiety   . Depression   . Headache(784.0)   . Hypothyroidism   . Prediabetes   . Puberty delay    Past Surgical History:  Procedure Laterality Date  . ESOPHAGOGASTRODUODENOSCOPY (EGD) WITH PROPOFOL N/A 05/25/2016   Procedure: ESOPHAGOGASTRODUODENOSCOPY (EGD) WITH PROPOFOL;  Surgeon: Beverley Fiedler, MD;  Location: [facility];  Service: Endoscopy;  Laterality: N/A;  . PLANTAR'S WART EXCISION      reports that he has  never smoked. He has never used smokeless tobacco. He reports that he does not drink alcohol or use drugs. family history includes Breast cancer in his other; Diabetes in his maternal grandmother; Heart disease in his maternal grandmother; Thyroid disease in his mother and other. Allergies  Allergen Reactions  . Reglan [Metoclopramide] Other (See Comments)    Seizure like activity      Outpatient Encounter Prescriptions as of 06/29/2017  Medication Sig  . amitriptyline (ELAVIL) 25 MG tablet Take 1 tablet (25 mg total) by mouth at bedtime. (Patient taking differently: Take 25 mg by mouth as needed. )  . promethazine (PHENERGAN) 25 MG tablet Take 1 tablet (25 mg total) by mouth every 8 (eight) hours as needed for nausea or vomiting. (Patient taking differently: Take 25 mg by mouth every 6 (six) hours as needed for nausea or vomiting. )  . calcium carbonate (OS-CAL) 600 MG TABS tablet Take 600 mg by mouth every evening. Reported on 04/05/2016  . carbamazepine (TEGRETOL XR) 100 MG 12 hr tablet Take 100 mg by mouth 2 (two) times daily. Patient taking on as needed basis  . guanFACINE (INTUNIV) 2 MG TB24 ER tablet Take 2 mg by mouth at bedtime.  Marland Kitchen levothyroxine (SYNTHROID) 25 MCG tablet Take one 25 mcg tablet per day for 4 days each week, but take 1.5 tablets per day on 3 days each week  . loratadine (CLARITIN) 10 MG tablet  Take 10 mg by mouth daily as needed for allergies. Reported on 04/05/2016  . Melatonin 5 MG TABS Take 5 mg by mouth daily.  . Multiple Vitamin (MULTIVITAMIN WITH MINERALS) TABS tablet Take 1 tablet by mouth daily.  Marland Kitchen. omeprazole (PRILOSEC) 20 MG capsule Take 1 capsule (20 mg total) by mouth daily.  . ondansetron (ZOFRAN-ODT) 8 MG disintegrating tablet Take 1 tablet (8 mg total) by mouth every 8 (eight) hours as needed for nausea or vomiting.  . [DISCONTINUED] atomoxetine (STRATTERA) 25 MG capsule Take 25 mg by mouth daily.   No facility-administered encounter medications on file as of  06/29/2017.      REVIEW OF SYSTEMS  : All other systems reviewed and negative except where noted in the History of Present Illness.   PHYSICAL EXAM: BP 102/80   Pulse 72   Ht 5\' 6"  (1.676 m)   Wt 130 lb 11.2 oz (59.3 kg)   BMI 21.10 kg/m  General: Well developed white male in no acute distress; appears younger than stated age Head: Normocephalic and atraumatic Eyes:  Sclerae anicteric, conjunctiva pink. Ears: Normal auditory acuity Lungs: Clear throughout to auscultation; no increased WOB. Heart: Regular rate and rhythm; no M/R/G. Abdomen: Soft, non-distended.  BS present.  Non-tender. Musculoskeletal: Symmetrical with no gross deformities  Skin: No lesions on visible extremities Extremities: No edema  Neurological: Alert oriented x 4, grossly non-focal Psychological:  Alert and cooperative. Normal mood and affect  ASSESSMENT AND PLAN: #251.  21 year old male with chronic constant nausea.  He has undergone extensive evaluation with no cause identified. 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  This is suspected to be functional. Zofran does help some so he will continue that as needed, alternating with Phenergan. He has Elavil on his medication left list, but has not been taking it. Elavil can be helpful with these symptoms at higher doses.  He is not even taking it currently so I am going to have him start taking it, 25 mg at bedtime, and see how he tolerates it.  Then can be increased if tolerated well.  Otherwise I am going to speak with Dr. Adela LankArmbruster to see if he recommends referring the patient to different facility, particularly [facility] to possibly see Dr. Alycia RossettiKoch.  Also, patient's mother is wanting a note to send to DSS in order to continue their food stamps since the patient is not able to work. I did not provide her with this today and said he would need to be discussed with Dr. Adela LankArmbruster. #2.   ADHD #3.  History of major depressive disorder #4.  Delayed puberty #5.  Hypothyroidism #6.  Generalized anxiety disorder    CC:  Nonnie DoneSlatosky, John J., MD

## 2017-07-26 ENCOUNTER — Telehealth: Payer: Self-pay

## 2017-07-26 NOTE — Telephone Encounter (Signed)
Raynelle Fanning can you clarify the following: Is he taking Tegretol or Intunive? Tegretol (causes chronic nausea in upwards of 30% of patients) and Intunive (chronic nausea in up to 7% of patients). One of these could be the cause. He should follow up with the prescribing physician for his Tegretol to discuss if he can come off this. Otherwise we have started Elavil, has he been taking this? I can also see him in clinic for follow up. Thanks

## 2017-07-26 NOTE — Telephone Encounter (Signed)
Routed to Dr. Adela Lank, please see ov note from 06/29/17.

## 2017-07-27 NOTE — Telephone Encounter (Signed)
Left message to return call 

## 2017-08-01 NOTE — Telephone Encounter (Signed)
Sent message to patient/mother since I have not heard back from them, asked them to contact office if needed.

## 2017-08-08 ENCOUNTER — Telehealth: Payer: Self-pay | Admitting: Gastroenterology

## 2017-08-08 ENCOUNTER — Telehealth: Payer: Self-pay

## 2017-08-08 NOTE — Telephone Encounter (Signed)
Patient's mother called back, stopping the Tegretol has made no difference, still on Intuniv but "has been on that for years". Is taking amitriptyline 25 mg qhs. He is still having same issues. Do you want to see him in office?

## 2017-08-08 NOTE — Telephone Encounter (Signed)
Yes that would be fine, thanks

## 2017-08-09 NOTE — Telephone Encounter (Signed)
Left message for patient's mother, Marylene Land, to contact our office to make a follow up appointment with Dr. Adela Lank to discuss chronic nausea.

## 2017-08-12 LAB — TESTOSTERONE TOTAL,FREE,BIO, MALES
Albumin: 4.3 g/dL (ref 3.6–5.1)
Sex Hormone Binding: 65 nmol/L — ABNORMAL HIGH (ref 10–50)
TESTOSTERONE BIOAVAILABLE: 38.4 ng/dL — AB (ref >=110.0)
TESTOSTERONE FREE: 19.5 pg/mL — AB (ref 46.0–224.0)
TESTOSTERONE: 276 ng/dL (ref 250–827)

## 2017-08-12 LAB — C-PEPTIDE: C PEPTIDE: 2.03 ng/mL (ref 0.80–3.85)

## 2017-08-12 LAB — T4, FREE: FREE T4: 1.1 ng/dL (ref 0.8–1.4)

## 2017-08-12 LAB — INSULIN-LIKE GROWTH FACTOR
IGF-I, LC/MS: 322 ng/mL (ref 83–456)
Z-Score (Male): 0.6 SD (ref ?–2.0)

## 2017-08-12 LAB — T3, FREE: T3 FREE: 3.6 pg/mL (ref 3.0–4.7)

## 2017-08-12 LAB — TSH: TSH: 3.18 m[IU]/L (ref 0.40–4.50)

## 2017-08-15 ENCOUNTER — Ambulatory Visit (INDEPENDENT_AMBULATORY_CARE_PROVIDER_SITE_OTHER): Payer: Medicaid Other | Admitting: "Endocrinology

## 2017-08-16 ENCOUNTER — Telehealth (INDEPENDENT_AMBULATORY_CARE_PROVIDER_SITE_OTHER): Payer: Self-pay | Admitting: "Endocrinology

## 2017-08-16 NOTE — Telephone Encounter (Signed)
  Who's calling (name and relationship to patient) : Marylene Landngela, mother  Best contact number: [phone]  Provider they see: Fransico MichaelBrennan  Reason for call: Mother called in to reschedule 10.16.2018 missed appt.  Next available afternoon appointment was on 1.03.2019.  Mother would like to know if she will have to re-do the labs from last week for the new appt.     PRESCRIPTION REFILL ONLY  Name of prescription:  Pharmacy:

## 2017-08-17 NOTE — Telephone Encounter (Signed)
Routed to provider

## 2017-08-21 ENCOUNTER — Other Ambulatory Visit (INDEPENDENT_AMBULATORY_CARE_PROVIDER_SITE_OTHER): Payer: Self-pay | Admitting: *Deleted

## 2017-08-21 ENCOUNTER — Encounter (INDEPENDENT_AMBULATORY_CARE_PROVIDER_SITE_OTHER): Payer: Self-pay | Admitting: *Deleted

## 2017-08-21 DIAGNOSIS — E034 Atrophy of thyroid (acquired): Secondary | ICD-10-CM

## 2017-08-30 NOTE — Telephone Encounter (Signed)
error 

## 2017-10-20 ENCOUNTER — Ambulatory Visit: Payer: Self-pay | Admitting: Gastroenterology

## 2017-11-01 NOTE — Telephone Encounter (Signed)
Done

## 2017-11-02 ENCOUNTER — Ambulatory Visit (INDEPENDENT_AMBULATORY_CARE_PROVIDER_SITE_OTHER): Payer: Medicaid Other | Admitting: "Endocrinology

## 2017-12-11 ENCOUNTER — Ambulatory Visit: Payer: Self-pay | Admitting: Gastroenterology

## 2018-02-27 IMAGING — RF DG UGI W/ HIGH DENSITY W/KUB
10 of 18 series · 12 of 24 positions shown · non-contrast
Comparison: Abdominal pelvic CT 03/28/2016 gastric emptying study
04/11/2016

CLINICAL DATA: 9-year-old with abdominal pain, nausea vomiting for
1 year. Weight loss.

EXAM:
UPPER GI SERIES WITH KUB
TECHNIQUE: After obtaining a scout radiograph a routine upper GI series was
performed using thin and high density barium.

[Series 2: cp_standard · 0.37mm/px · 2 of 12 frames shown (1 of 10)]
[frame 2/12]
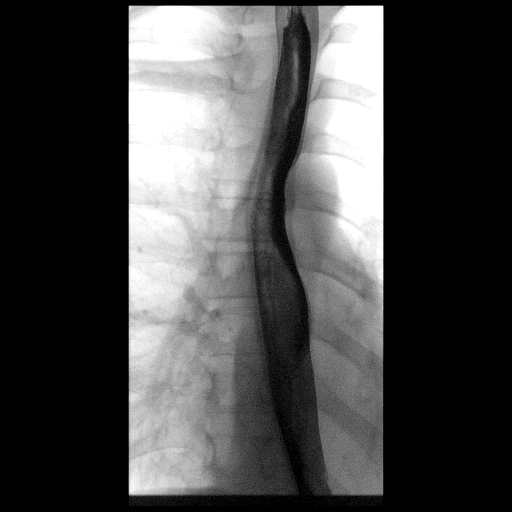
[frame 12/12]
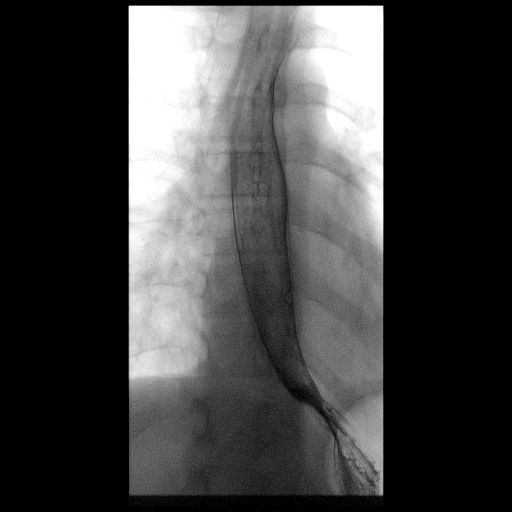

[Series 3: cp_standard · 0.37mm/px · 2 of 10 frames shown (2 of 10)]
[frame 2/10]
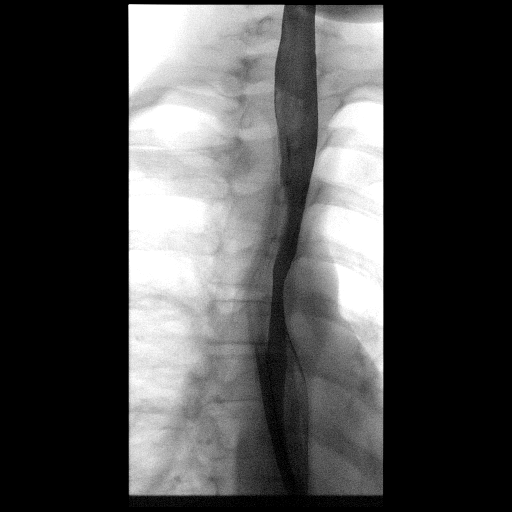
[frame 9/10]
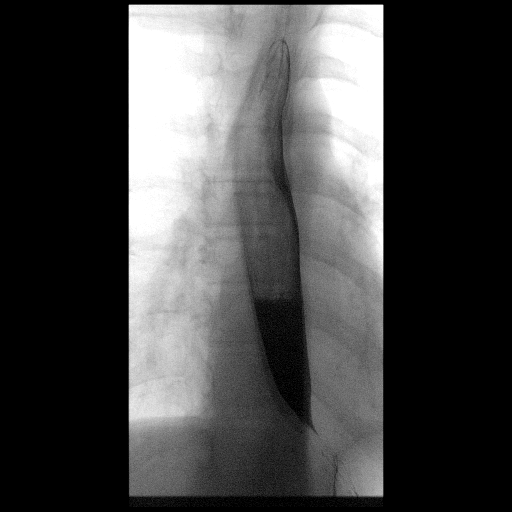

[Series 4: cp_standard · 0.38mm/px · 1 of 6 frames shown (3 of 10)]
[frame 6/6]
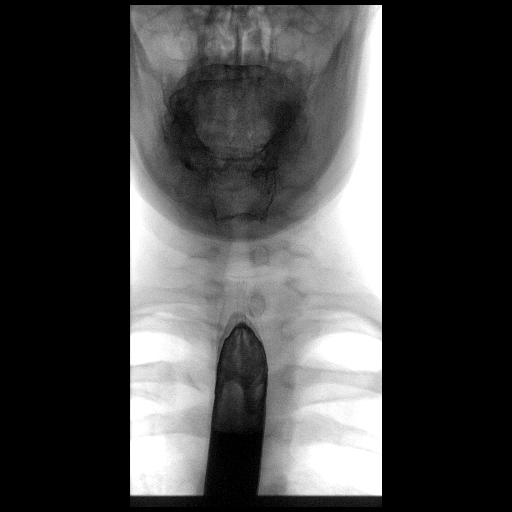

[Series 6: cp_standard · 0.20mm/px · 1 of 1 slices shown (4 of 10)]
[im 1/1]
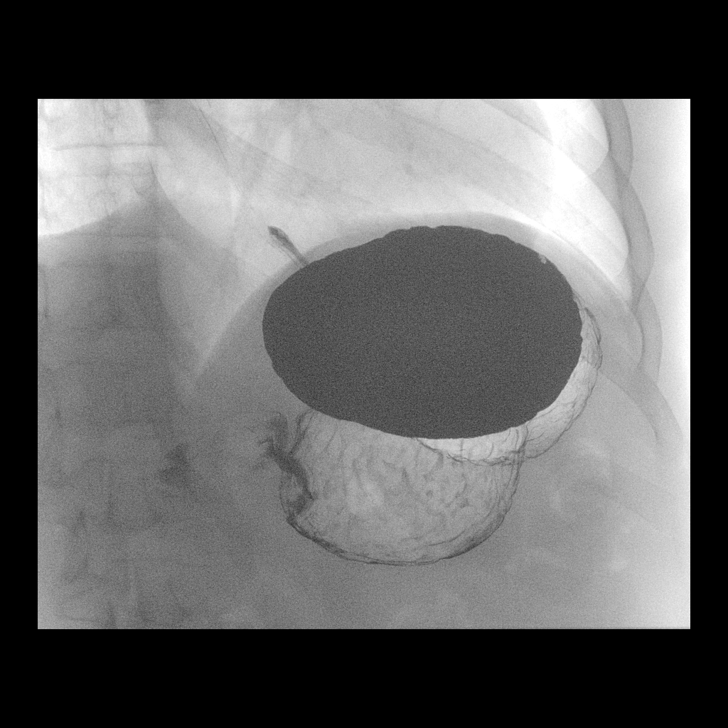

[Series 8: cp_standard · 0.20mm/px · 1 of 1 slices shown (5 of 10)]
[im 1/1]
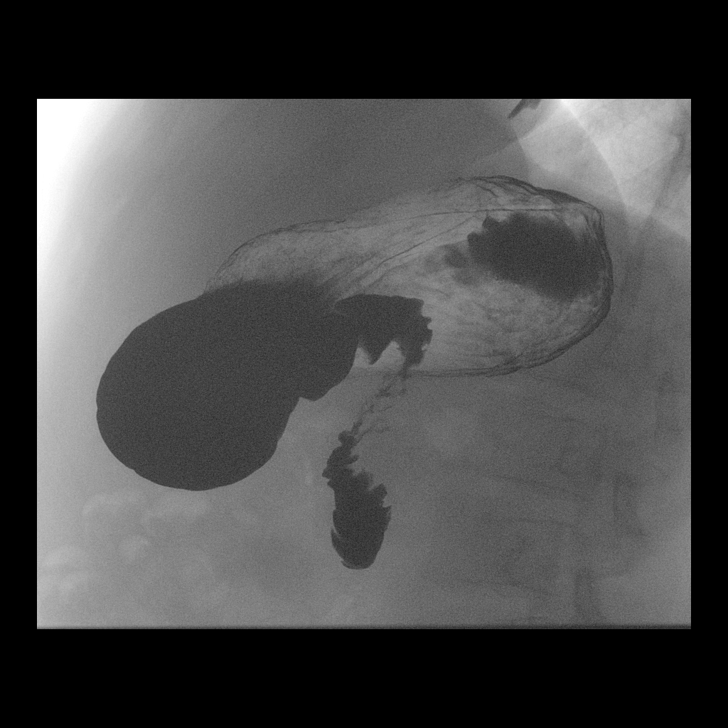

[Series 11: cp_standard · 0.20mm/px · 1 of 1 slices shown (6 of 10)]
[im 1/1]
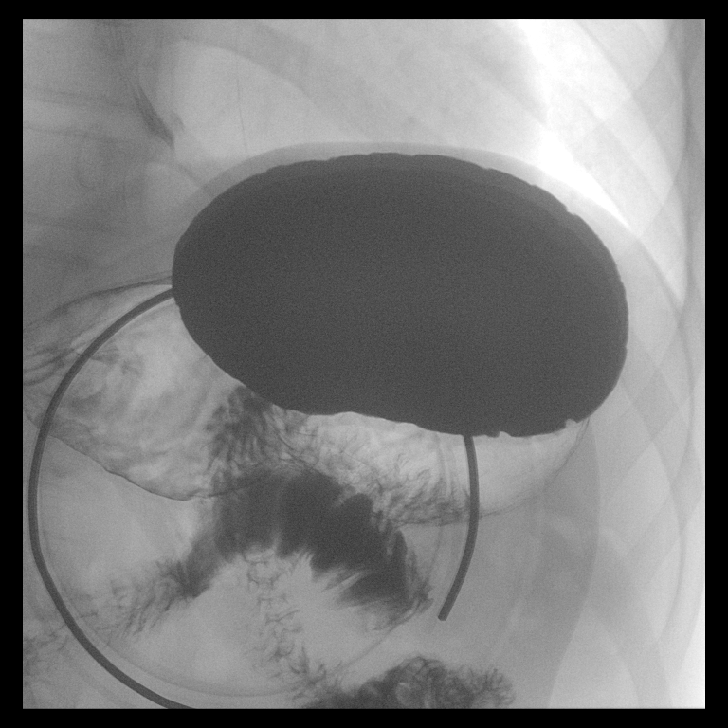

[Series 13: cp_standard · 0.20mm/px · 1 of 1 slices shown (7 of 10)]
[im 1/1]
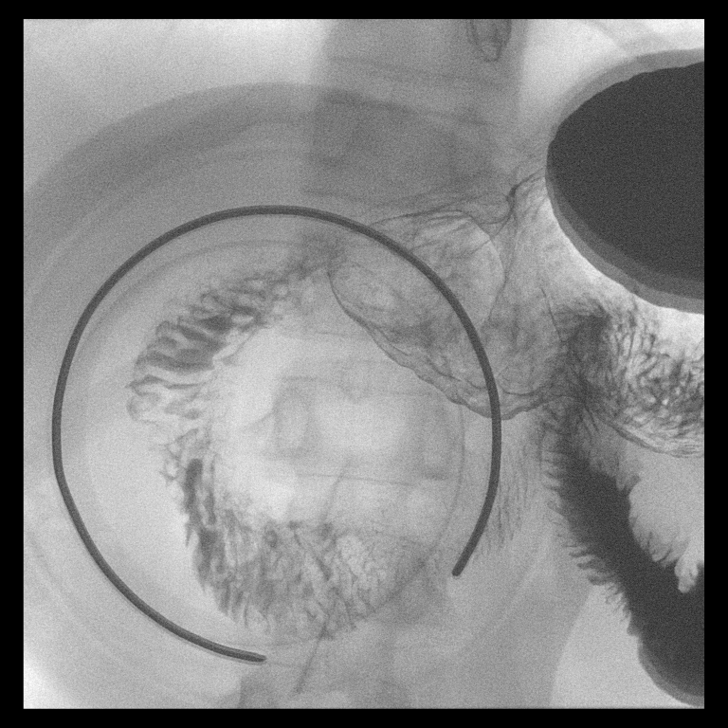

[Series 15: cp_standard · 0.31mm/px · 1 of 1 slices shown (8 of 10)]
[im 1/1]
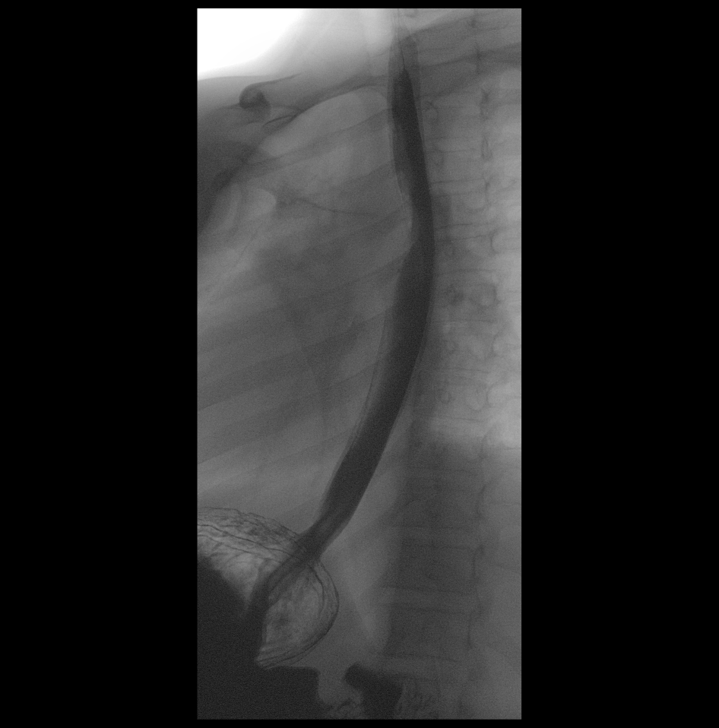

[Series 18: cp_standard · 0.21mm/px · 1 of 1 slices shown (9 of 10)]
[im 1/1]
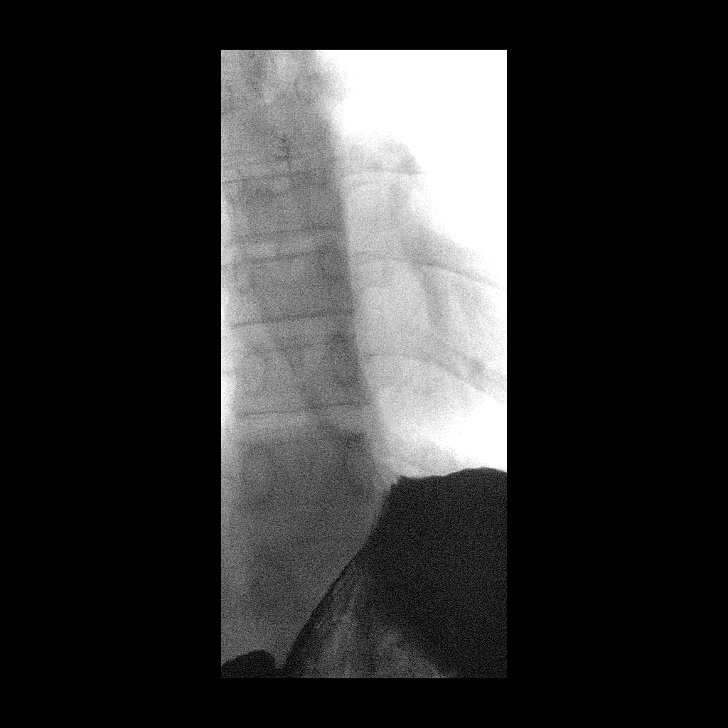

[Series 20: cp_standard · 0.21mm/px · 1 of 1 slices shown (10 of 10)]
[im 1/1]
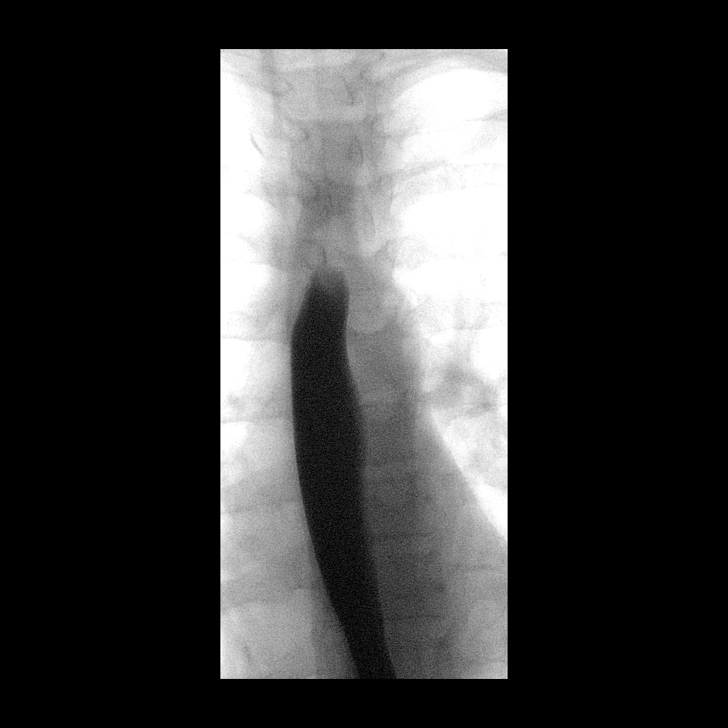

[12 of 24 positions shown; findings below may reference images not displayed]

FLUOROSCOPY TIME:  Radiation Exposure Index (as provided by the
fluoroscopic device): 154.89 uGy*m2

If the device does not provide the exposure index:

Fluoroscopy Time (in minutes and seconds): 1 minute and 12 seconds
of pulsed fluoro

Number of Acquired Images: 1 scout image. Remainder of examination
performed with fluoro store.
FINDINGS: The scout abdominal radiograph is normal. The patient swallowed the
barium without difficulty. The esophageal motility is normal. There
is no stricture, mass, ulceration or hiatal hernia. The stomach and
duodenum appear normal. There is no ulceration or gastric outlet
obstruction. Moderate gastroesophageal reflux to the level of the
carina was elicited with the water siphon test.
IMPRESSION: 1. Moderate gastroesophageal reflux.
2. Otherwise normal upper GI series.

## 2018-03-09 ENCOUNTER — Other Ambulatory Visit (INDEPENDENT_AMBULATORY_CARE_PROVIDER_SITE_OTHER): Payer: Self-pay | Admitting: "Endocrinology

## 2018-03-09 DIAGNOSIS — E063 Autoimmune thyroiditis: Secondary | ICD-10-CM

## 2018-03-13 ENCOUNTER — Ambulatory Visit (INDEPENDENT_AMBULATORY_CARE_PROVIDER_SITE_OTHER): Payer: Medicaid Other | Admitting: "Endocrinology

## 2018-04-04 ENCOUNTER — Encounter (INDEPENDENT_AMBULATORY_CARE_PROVIDER_SITE_OTHER): Payer: Self-pay | Admitting: "Endocrinology

## 2018-04-04 ENCOUNTER — Telehealth (INDEPENDENT_AMBULATORY_CARE_PROVIDER_SITE_OTHER): Payer: Self-pay | Admitting: "Endocrinology

## 2018-04-04 ENCOUNTER — Ambulatory Visit (INDEPENDENT_AMBULATORY_CARE_PROVIDER_SITE_OTHER): Payer: Medicaid Other | Admitting: "Endocrinology

## 2018-04-04 VITALS — BP 118/64 | HR 56 | Ht 68.43 in | Wt 130.8 lb

## 2018-04-04 DIAGNOSIS — R7303 Prediabetes: Secondary | ICD-10-CM

## 2018-04-04 DIAGNOSIS — R6252 Short stature (child): Secondary | ICD-10-CM | POA: Diagnosis not present

## 2018-04-04 DIAGNOSIS — E663 Overweight: Secondary | ICD-10-CM | POA: Diagnosis not present

## 2018-04-04 DIAGNOSIS — E300 Delayed puberty: Secondary | ICD-10-CM | POA: Diagnosis not present

## 2018-04-04 DIAGNOSIS — Z68.41 Body mass index (BMI) pediatric, 85th percentile to less than 95th percentile for age: Secondary | ICD-10-CM | POA: Diagnosis not present

## 2018-04-04 DIAGNOSIS — L83 Acanthosis nigricans: Secondary | ICD-10-CM

## 2018-04-04 DIAGNOSIS — E049 Nontoxic goiter, unspecified: Secondary | ICD-10-CM | POA: Diagnosis not present

## 2018-04-04 DIAGNOSIS — E063 Autoimmune thyroiditis: Secondary | ICD-10-CM

## 2018-04-04 LAB — POCT GLYCOSYLATED HEMOGLOBIN (HGB A1C): HEMOGLOBIN A1C: 5.1 % (ref 4.0–5.6)

## 2018-04-04 LAB — POCT GLUCOSE (DEVICE FOR HOME USE): POC Glucose: 79 mg/dl (ref 70–99)

## 2018-04-04 NOTE — Telephone Encounter (Signed)
°  Who's calling (name and relationship to patient) : Marylene Landngela (Mother) Best contact number: [phone] Provider they see: Dr. Fransico MichaelBrennan Reason for call: Mom would like to know if pt needs blood drawn today before appointment today. Please advise.

## 2018-04-04 NOTE — Progress Notes (Signed)
Subjective:  Patient Name: Charles Beard Date of Birth: 03-Mar-1996  MRN: [id number]  Charles Beard  presents to the office today for follow up evaluation and management of his puberty delay, obesity, gynecomastia, linear growth delay/short stature, hypothyroid, thyroiditis, goiter, nausea and abdominal pains, and unintentional weight loss.   HISTORY OF PRESENT ILLNESS:   Charles Beard is a 22 y.o. Caucasian young man.   Charles Beard was accompanied by his mother.  1. Charles Beard's initial [facility] pediatric endocrine consultation was on 05/22/13 at age 80:  A. [name]: Term, emergency C-section for failure to progress. Birth weight: 7 lbs-15 oz. Healthy newborn.  B. Infancy: Healthy  C. Childhood: ADHD was diagnosed about age 63. Charles Beard was followed at [facility] and [facility]. Stimulants and non-stimulants did not work.  He also had severe anger issues. He sometimes bit himself or hit things, but had not hurt himself seriously. He was hit in the forehead by a pitched baseball at about age 54-8. There was no loss of consciousness. His only surgery was curettage of plantar warts. He had seasonal allergies, but no medication allergies.    D. Obesity: Mom had been concerned about his weight gain for several years. Mom tried to get him to eat healthy. According to mom, dad fed him whatever he wanted. Neither dad nor Charles Beard exercised.   E. Puberty delay: He had no axillary hair, pubic hair, or genital development.  F. Pertinent family history: Strong FH of obesity in mom and  maternal relatives. FH of T2DM in maternal grandmother. No FH of delayed puberty. Hypothyroidism in mom, MGM, MGGM. Neither mom nor MGM had thyroid surgery or irradiation. Menopause at age 38 in mom, MGM, and MGGM. Mental health issues on dad's side of family. Dad had severe mood swings. Dad was an alcoholic and PGF was an alcoholic. [Addendum 11/18/14: Mom stated that dad also used drugs. Mom and dad divorced when Charles Beard was age 37. Mom also had  depression.] [Addendum 05/11/17: Charles Beard accused his mother of frequently getting drunk at night. Mom said that she sometimes had two glasses of wine at night, but not every night. Charles Beard said that she drank a full bottle of wine each night. ]  G. On exam, his BP was elevated at 122/87. He was quite agitated during the visit, supposedly because he was angry that mom was sharing his personal health information with yet another person. He was significantly overweight. He had a 19-20 gram goiter. His breasts were fatty with a Tanner stage 3 appearance and enlarged areolae, but no breast buds. His abdomen was enlarged. His pubic hair was Tanner stage 1. His testes were 2-3 mL in volume, very early pubertal. Lab tests showed normal CMP,TFTs, and TPO antibody levels. His FSH was 2.2, LH 0.3, testosterone 63, and estradiol 15.4, all c/w being in early puberty.  H. I diagnosed pubertal delay, linear growth delay, early gynecomastia, overweight, dyspepsia, goiter, and acanthosis. I instructed Charles Beard and his mother on our Eat Right Diet and proper exercise techniques. I also started him on ranitidine, 150 mg, twice daily.   2. During the past five years Charles Beard has had slow progression of puberty. As he has grown older and has had adjustments in his psychiatric medications, he has also become more emotionally stable and less emotionally reactive. 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 He has had recurrent problems with cyclical vomiting syndrome, nausea, and abdominal pains. In the past two years  he has been progressively losing weight.   3. Charles Beard's last [facility] visit was on 05/11/17. At that visit I increased his Synthroid dose to 37.5 mcg/day on three days each week, but continued the 25 mcg/day for 4 days each week. He was a No Show for appointments in October 2018, January 2019, and May 2019  A. In the interim he has not had any new health issues or problems.   B.  He misses his Synthroid doses about 1-2 days per week.   C. He still gets nausea now and then, but his abdominal pain has resolved. He still gets hungry, but not as much as he used to. He saw Charles Gip, PA, at [facility] again on 02/07/17. She decreased his omeprazole to 20 mg each morning, reduced the Phenergan to 12.5 mg/day as needed, and continued his Zofran 8 mg, ODT every morning. He also takes melatonin, calcium, and MVI.  C. He stopped taking Strattera due to "feeling weird". He also stopped Focalin due to tachycardia. Charles Beard no longer goes to Dr. Jannifer Charles Beard. He also stopped carbamazepine due to it making him "ugly".   D.  His separation anxiety is "still there, but not as bad". He has not been feeling depressed. He pretty much stays at home or goes to his father's home. He spends most of his time playing video games. He stays up much of the night and sleeps in during much of the day.   E. He had psychological testing in the past. According to mom, Charles Beard was found to have "a little bit of a low IQ". ADD was also noted. An individual IEP was developed. He did not finish high school.   F. He eats about once a day. Mom restricts his sugar and starch intake due to fears about him becoming a diabetic. Mom is also very rigid about wanting him to eat at mealtimes.    3.  Pertinent Review of Systems:  Constitutional: He feels "great, but I wish that I had something on my stomach". He did not eat before coming here today Eyes: He says that he can see well without his glasses. He refuses to wear glasses. He had his last eye exam on 06/01/15 and had a new pair of eye glasses.There are no recognized eye problems. Mom has not yet scheduled him for a follow up eye exam.  Neck: The patient has no complaints of anterior neck swelling, soreness, tenderness, pressure, discomfort, or difficulty swallowing.   Heart: Heart rate increases with exercise or other physical activity. He has no complaints of palpitations,  irregular heart beats, chest pain, or chest pressure.   Gastrointestinal: As above. Sometimes his stomach growls. Bowel movements are good for the most part.  Legs: Muscle mass and strength seem normal. There are no complaints of numbness, tingling, burning, or pain. No edema is noted.  Feet: There are no complaints of numbness, tingling, burning, or pain. No edema is noted. Neurologic: There are no recognized problems with muscle movement and strength, sensation, or coordination. GU: He has more pubic hair and axillary hair. He says that his genitalia are larger. Voice may be a little deeper.   PAST MEDICAL, FAMILY, AND SOCIAL HISTORY  Past Medical History:  Diagnosis Date  . ADHD (attention deficit hyperactivity disorder) 2000  . Anxiety   . Depression   . Headache(784.0)   . Hypothyroidism   . Prediabetes   . Puberty delay     Family History  Problem Relation Age of Onset  .  Thyroid disease Mother   . Diabetes Maternal Grandmother        and MGGM  . Heart disease Maternal Grandmother        and MGGM  . Breast cancer Other        and MGGM  . Thyroid disease Other        MGGM  . Colon cancer Neg Hx      Current Outpatient Medications:  .  amitriptyline (ELAVIL) 25 MG tablet, Take 1 tablet (25 mg total) by mouth at bedtime. (Patient taking differently: Take 25 mg by mouth as needed. ), Disp: 30 tablet, Rfl: 0 .  calcium carbonate (OS-CAL) 600 MG TABS tablet, Take 600 mg by mouth every evening. Reported on 04/05/2016, Disp: , Rfl:  .  carbamazepine (TEGRETOL XR) 100 MG 12 hr tablet, Take 100 mg by mouth 2 (two) times daily. Patient taking on as needed basis, Disp: , Rfl:  .  guanFACINE (INTUNIV) 2 MG TB24 ER tablet, Take 2 mg by mouth at bedtime., Disp: , Rfl:  .  levothyroxine (SYNTHROID, LEVOTHROID) 25 MCG tablet, TAKE 1 TABLET BY MOUTH DAILY FOR 4 DAYS EACH WEEK, BUT TAKE 1 & 1/2 TABLETS DAILY 3 DAYS EACH WEEK, Disp: 45 tablet, Rfl: 5 .  loratadine (CLARITIN) 10 MG tablet,  Take 10 mg by mouth daily as needed for allergies. Reported on 04/05/2016, Disp: , Rfl:  .  Melatonin 5 MG TABS, Take 5 mg by mouth daily., Disp: , Rfl:  .  Multiple Vitamin (MULTIVITAMIN WITH MINERALS) TABS tablet, Take 1 tablet by mouth daily., Disp: , Rfl:  .  omeprazole (PRILOSEC) 20 MG capsule, Take 1 capsule (20 mg total) by mouth daily., Disp: 30 capsule, Rfl: 8 .  ondansetron (ZOFRAN-ODT) 8 MG disintegrating tablet, Take 1 tablet (8 mg total) by mouth every 8 (eight) hours as needed for nausea or vomiting., Disp: 90 tablet, Rfl: 6 .  promethazine (PHENERGAN) 25 MG tablet, Take 1 tablet (25 mg total) by mouth every 8 (eight) hours as needed for nausea or vomiting. (Patient taking differently: Take 25 mg by mouth every 6 (six) hours as needed for nausea or vomiting. ), Disp: 90 tablet, Rfl: 1  Allergies as of 04/04/2018 - Review Complete 04/04/2018  Allergen Reaction Noted  . Reglan [metoclopramide] Other (See Comments) 03/28/2016     reports that he has never smoked. He has never used smokeless tobacco. He reports that he does not drink alcohol or use drugs. Pediatric History  Patient Guardian Status  . Mother:  Tanish, Prien   Other Topics Concern  . Not on file  Social History Narrative   Lives at home with mom and 2 cats, visits dad every other weekend, will attend [employer], will start 9th grade in the fall.    1. School and Family: As above. He is no longer trying to finish high school.  Hiroyuki lives with mom, but has been spending 1-2 visits a month with dad. His [place] coverage will expire on September on his 22st birthday.   2. Activities: His physical activities vary with his GI symptoms.  3. Primary Care Provider: Dr. Cheri Rous in [place], [facility], phone [phone] 4. Psychiatrist: None 5. GI: Ms Charles Gip, PA at [facility]   REVIEW OF SYSTEMS: There are no other significant problems involving Camdyn's other body systems.    Objective:   BP 118/64   Pulse (!) 56   Ht 5' 8.43" (1.738 m)   Wt 130  lb 12.8 oz (59.3 kg)   BMI 19.64 kg/m    Ht Readings from Last 3 Encounters:  04/04/18 5' 8.43" (1.738 m)  06/29/17 5\' 6"  (1.676 m) (10 %, Z= -1.28)*  05/11/17 5' 7.21" (1.707 m) (20 %, Z= -0.85)*   * Growth percentiles are based on CDC (Boys, 2-20 Years) data.   Wt Readings from Last 3 Encounters:  04/04/18 130 lb 12.8 oz (59.3 kg)  06/29/17 130 lb 11.2 oz (59.3 kg) (12 %, Z= -1.17)*  05/11/17 133 lb (60.3 kg) (15 %, Z= -1.03)*   * Growth percentiles are based on CDC (Boys, 2-20 Years) data.   HC Readings from Last 3 Encounters:  No data found for [facility]   Body surface area is 1.69 meters squared. Facility age limit for growth percentiles is 20 years. Facility age limit for growth percentiles is 20 years.    PHYSICAL EXAM:  Constitutional:   A. The patient appears tired and depressed today. He was alert, but somewhat withdrawn.  His affect was still fairly flat. His insight was fair. His height has increased since his last visit. His weight has decreased 3 pounds. He burped frequently during the visit.   B. Today's visit was marked by frequent arguments back and forth between Quanah and mom. Mom was again very critical of Krishang and he responded with anger and defensiveness. I finally had to tell her that some of her comments were inappropriate and counterproductive. She apologized. Head: The head is normocephalic. Face: The face appears normal. There are no obvious dysmorphic features or plethora. Eyes: The eyes appear to be normally formed and spaced. Gaze is conjugate. There is no obvious arcus or proptosis. Eye moisture is normal.  Ears: The ears are normally placed and appear externally normal. Mouth: The oropharynx and tongue appear normal. Dentition appears to be normal for age. Mouth is a bit dry.  There is no hyperpigmentation. He has a grade I fine, blond mustache.  Neck: The neck appears to be  visibly enlarged. No carotid bruits are noted. The thyroid gland is still enlarged, but smaller, at about 21 grams in size. Today both lobes are mildly enlarged. The consistency of the thyroid gland is normal today. The thyroid gland is not  tender to palpation. He has no acanthosis.  Lungs: The lungs are clear to auscultation. Air movement is good. Heart: Heart rate and rhythm are regular. Heart sounds S1 and S2 are normal. I did not appreciate any pathologic cardiac murmurs. Abdomen: The abdomen is normal in size. Bowel sounds are normal. He is not tender to palpation today. Arms: Muscle size and bulk are normal for age. Hands: There is no obvious tremor. Phalangeal and metacarpophalangeal joints are normal. Palmar muscles are normal for age. Palmar skin is normal. Palmar moisture is also normal. Fingernails are pale.  Legs: Muscles appear normal for age. No edema is present. Neurologic: Strength is normal for age in both the upper and lower extremities. His upper body strength has been gradually increasing over time. Muscle tone is normal. Sensation to touch is normal in both legs.  GU: Pubic hair is Tanner stage III+. Right testis measure 20-25 mL in volume, left 15-20 mL  LAB DATA:   Results for orders placed or performed in visit on 04/04/18 (from the past 504 hour(s))  POCT Glucose (Device for Home Use)   Collection Time: 04/04/18  3:44 PM  Result Value Ref Range   Glucose Fasting, POC  70 - 99 mg/dL  POC Glucose 79 70 - 99 mg/dl  POCT HgB R6E   Collection Time: 04/04/18  3:52 PM  Result Value Ref Range   Hemoglobin A1C 5.1 4.0 - 5.6 %   HbA1c, POC (prediabetic range)  5.7 - 6.4 %   HbA1c, POC (controlled diabetic range)  0.0 - 7.0 %    Labs 04/04/18: HbA1c 5.1%, CBG 79  08/09/17: TSH 3.18, free T4 1.1, free T3 3.6; C-peptide 2.03 (ref 0.80-3.85); testosterone 276  04/06/17: HbA1c 5.5%; TSH 2.28, free T4 1.2, free T3 3.7; testosterone 230; IGF-1 368, IGFBP-3 6.7 (ref 2.9-7.3)      12/29/16: HbA1c 5.1%; TSH 2.24, free T4 1.59; , free T3 39; IGF-1 262, IGFBP-3 5.9 (ref 2.9-7.3); LH 2.4, FSH 2.2, testosterone 204, estradiol <15   07/11/16: HbA1c 5.4%  05/25/16: TSH 1.561  05/24/16: HbA1c 5.4%, TSH 1.055  05/23/16: As above  05/18/16: LH 0.6, FSH 1.5, testosterone 23; TSH 1.73, free T4 1.0, free T3 2.9; IGF-1 79, IGFBP-3 4.0  04/05/16: Celiac panel and IgA both normal  02/23/16: HbA1c 5.7%  01/05/16: Urine showed small bilirubin; CMP normal, except for glucose 105 after vomiting; CBC with WBC 11.2;   11/25/15: HbA1c 5.6%  09/26/15: HbA1c 5.8%; IGF-1 110, IGFBP-3 4.0; LH 0.4, FSH 1.9, testosterone 30, free testosterone 3.6, estradiol 14.6; TSH 2.315, TPO antibody <1  06/18/15: Chromosomes 46, XY  06/09/15: HbA1c 5.8%; TSH 1.906, free T4 0.94, free T3 2.8; testosterone 24; IGF-1 110; IGFBP-3 5.0  02/27/15: HbA1c 5.7%; TSH 3.443, free T4 0.74, free T3 3.0; testosterone 29  10/28/14: Testosterone 21, IGF-1 119  08/14/14: TSH 2.835, free T4 0.97, free T3 3.6; LH 0.6, FSH 1.6, testosterone 18, estradiol < 11.8; IGF-1 108, IGFBP-3 3.8  06/18/14: testosterone 27, prolactin 3.8; TSH 3.106, free T4 0.93, free T3 3.0  11/20/13: TSH 0.5, free T4 0.99, free T3 3.4; LH 0.5, FSH 3.1, testosterone 18, estradiol < 11.8  07/15/13: CBC normal; CMP normal;   05/22/13: CMP normal; TSH 2.46, free T4 1.06, free T3 3.3, TPO antibody 11.2; FSH 2.2, LH 0.3, testosterone 63, estradiol 15.6  IMAGING:  Bone age 83/21/17: Bone age was read as 14 years at a chronologic age of 45 years and 7 months. I read the bone age as 13-6.   Bone age 61/23/14: Bone age 57 years at chronologic age 34 years, 10 months.   MRI of head 05/30/13: Normal pituitary gland and brain.   Assessment and Plan:   ASSESSMENT:  1. Puberty delay:   A. Danen's testes have gradually increased in size over time. His most recent testosterone value in June 2018 indicated that Orlin was progressing through puberty, albeit  slowly. His exam today also shows that he is progressing through puberty.  B. I suspect that his GI problems and his hypothyroidism may be impeding his pubertal progress.  2. Growth delay, linear: His height growth has increased in the past year. His epiphyses appear to still be open. He may not be taking in enough calories to support his growth.    3-5: Goiter/thyroiditis/hypothyroid:   A. His thyroid gland is smaller today. The process of waxing and waning of thyroid gland size is c/w slowly evolving Hashimoto's thyroiditis.  His thyroiditis is clinically quiescent today.   B. His TFTs in July 2017 were mid-normal on his current dose of Synthroid. His TFTs in March 2018 and June 2018 were low. His TFTs in October 2018 were within normal, but his TSH was still above goal range of 1.0-2.0.  He needed a small increase in Synthroid dosage. Unfortunately, he has not been consistently taking his Synthroid and did not obtain follow up lab tests as I requested. 6-7. Overweight/unintentional weight loss: He is still losing weight, in part because he is not as hungry and in part due to his GI problems. He does not appear to be anorexic.   8-10. Nausea/reflux/dyspepsia/abdominal pain:   A. The nausea is essentially unchanged, but the other symptoms are better.   Ebbie RidgeB. Mehar and mom intend to return to  GI for follow up.   11-13. Acanthosis: Resolved after weight loss. He had acquired acanthosis, due to hyperinsulinemia, which in turn was caused by the resistance to insulin resulting from the excessive production of cytokines by overly fat adipose cells.  14. Hypertension: His BP is normal.   12. Prediabetes (?): His HbA1c has decreased to 5.1%. His C-peptide in October 2018 was mid-normal. We need to continue to follow this issue over time. Given the autoimmune nature of his thyroiditis and hypothyroidism, it is possible that he could be slowly developing T1DM.   PLAN:   1. Diagnostic: I reviewed his past  lab results and clinical course.  Repeat lab tests in 6 weeks.  2. Therapeutic: Continue Synthroid dose 1.5 of the 25 mcg tablets per day for three days each week, but take only one 25 mcg tablet/day for the other 4 days each week.  3. Patient education: We spent part of the visit discussing his thyroid issues and GI problems. However, we spent most of the visit discussing the social interactions at home and mom's and Admir's differing perceptions about those interactions.  4. Follow-up: 2 months.   Level of Service: This visit lasted in excess of 80 minutes. More than 50% of the visit was devoted to counseling.  Molli KnockMichael Jacquelyn Antony, MD, CDE Adult and Pediatric Endocrinology

## 2018-04-04 NOTE — Patient Instructions (Addendum)
Follow up visit in 2 months. Please repeat lab tests one week prior to next visit.  

## 2018-04-04 NOTE — Telephone Encounter (Signed)
Patient here for visit, will discuss with Dr. Fransico MichaelBrennan.

## 2018-04-05 LAB — TESTOSTERONE TOTAL,FREE,BIO, MALES
Albumin: 4.4 g/dL (ref 3.6–5.1)
Sex Hormone Binding: 64 nmol/L — ABNORMAL HIGH (ref 10–50)
TESTOSTERONE FREE: 46.4 pg/mL (ref 46.0–224.0)
Testosterone, Bioavailable: 93.4 ng/dL — ABNORMAL LOW (ref >=110.0)
Testosterone: 605 ng/dL (ref 250–827)

## 2018-04-05 LAB — T4, FREE: Free T4: 1.1 ng/dL (ref 0.8–1.4)

## 2018-04-05 LAB — HEMOGLOBIN A1C
HEMOGLOBIN A1C: 5.2 %{Hb} (ref <5.7)
MEAN PLASMA GLUCOSE: 103 (calc)
eAG (mmol/L): 5.7 (calc)

## 2018-04-05 LAB — T3, FREE: T3 FREE: 4 pg/mL (ref 3.0–4.7)

## 2018-04-05 LAB — ESTRADIOL: Estradiol: 28 pg/mL (ref <=39)

## 2018-04-05 LAB — TSH: TSH: 2.07 m[IU]/L (ref 0.40–4.50)

## 2018-04-11 ENCOUNTER — Telehealth (INDEPENDENT_AMBULATORY_CARE_PROVIDER_SITE_OTHER): Payer: Self-pay | Admitting: "Endocrinology

## 2018-04-11 NOTE — Telephone Encounter (Signed)
°  Who's calling (name and relationship to patient) : Arleta Creekoole,Angela (Mother)  Best contact number: [phone]  Provider they see: Fransico MichaelBrennan  Reason for call: Patient's mother is requesting that we reach out to pharmacy and advise the instructions for medication below.  She said she was told by provider that patient should take 1 and 1/2 tablets for three days and then 1 tablet for 4 days.    Name of prescription: levothyroxine (SYNTHROID, LEVOTHROID) 25 MCG tablet  [facility] - [place], [place] - [address]

## 2018-04-12 NOTE — Telephone Encounter (Signed)
Called pharmacy to clarify rx, they stated it has been corrected and prescription id ready to be picked up. Call us if you have any questions.

## 2018-04-18 ENCOUNTER — Encounter (INDEPENDENT_AMBULATORY_CARE_PROVIDER_SITE_OTHER): Payer: Self-pay | Admitting: *Deleted

## 2018-05-16 ENCOUNTER — Ambulatory Visit: Payer: Self-pay | Admitting: Gastroenterology

## 2018-05-22 ENCOUNTER — Telehealth: Payer: Self-pay | Admitting: Gastroenterology

## 2018-05-23 NOTE — Telephone Encounter (Signed)
Raynelle FanningJulie, this patient has not been seen since 06/29/17 with Doug SouJessica Zehr. He has no showed his last appointment with us on 05/16/18, and cancelled same day on 10/20/17. He previously no showed on 06/26/17 with Mike GipAmy Esterwood as well as cancelled an appointment with Shanda BumpsJessica on 05/31/16.  He needs to follow up with us for reassessment. Hard to comment on his status on paperwork if we have not seen him in almost a year. He needs to be compliant with keeping his appointments with us if he wishes to continue to receive care here. Can you help reschedule him. Thanks

## 2018-05-23 NOTE — Telephone Encounter (Signed)
Spoke to patient's mother and she just wanted the office note from 06/29/17 mailed to her again as she lost previous copy. Mailed copy again.

## 2018-05-23 NOTE — Telephone Encounter (Signed)
Please advise, sounds like they need a letter from you and not just the office note. Office note was sent to the patient's mother last year.

## 2018-06-04 LAB — INSULIN-LIKE GROWTH FACTOR
IGF-I, LC/MS: 256 ng/mL (ref 83–456)
Z-Score (Male): 0.1 SD (ref ?–2.0)

## 2018-06-04 LAB — C-PEPTIDE: C PEPTIDE: 1.26 ng/mL (ref 0.80–3.85)

## 2018-06-04 LAB — TESTOSTERONE TOTAL,FREE,BIO, MALES
Albumin: 4.2 g/dL (ref 3.6–5.1)
Sex Hormone Binding: 47 nmol/L (ref 10–50)
TESTOSTERONE: 631 ng/dL (ref 250–827)
Testosterone, Bioavailable: 126.9 ng/dL (ref >=110.0)
Testosterone, Free: 65.9 pg/mL (ref 46.0–224.0)

## 2018-06-04 LAB — T4, FREE: FREE T4: 1.1 ng/dL (ref 0.8–1.4)

## 2018-06-04 LAB — TSH: TSH: 1.68 m[IU]/L (ref 0.40–4.50)

## 2018-06-04 LAB — T3, FREE: T3 FREE: 4 pg/mL (ref 3.0–4.7)

## 2018-06-05 ENCOUNTER — Ambulatory Visit (INDEPENDENT_AMBULATORY_CARE_PROVIDER_SITE_OTHER): Payer: Medicaid Other | Admitting: "Endocrinology

## 2018-06-05 ENCOUNTER — Encounter (INDEPENDENT_AMBULATORY_CARE_PROVIDER_SITE_OTHER): Payer: Self-pay | Admitting: "Endocrinology

## 2018-06-05 VITALS — BP 112/76 | HR 100 | Ht 68.62 in | Wt 137.6 lb

## 2018-06-05 DIAGNOSIS — R110 Nausea: Secondary | ICD-10-CM

## 2018-06-05 DIAGNOSIS — I10 Essential (primary) hypertension: Secondary | ICD-10-CM

## 2018-06-05 DIAGNOSIS — R6252 Short stature (child): Secondary | ICD-10-CM

## 2018-06-05 DIAGNOSIS — E063 Autoimmune thyroiditis: Secondary | ICD-10-CM

## 2018-06-05 DIAGNOSIS — F419 Anxiety disorder, unspecified: Secondary | ICD-10-CM

## 2018-06-05 DIAGNOSIS — E300 Delayed puberty: Secondary | ICD-10-CM | POA: Diagnosis not present

## 2018-06-05 DIAGNOSIS — F329 Major depressive disorder, single episode, unspecified: Secondary | ICD-10-CM

## 2018-06-05 DIAGNOSIS — E049 Nontoxic goiter, unspecified: Secondary | ICD-10-CM

## 2018-06-05 MED ORDER — LEVOTHYROXINE SODIUM 50 MCG PO TABS: ORAL_TABLET | ORAL | 11 refills | Status: DC

## 2018-06-05 NOTE — Progress Notes (Signed)
Subjective:  Patient Name: Charles Beard Date of Birth: April 21, 1996  MRN: [id number]  Charles Beard  presents to the office today for follow up evaluation and management of his puberty delay, obesity, gynecomastia, linear growth delay/short stature, hypothyroid, thyroiditis, goiter, nausea and abdominal pains, and unintentional weight loss.   HISTORY OF PRESENT ILLNESS:   Charles Beard is a 22 y.o. Caucasian young man.   [name] was accompanied by his mother.  1. Charles Beard's initial [facility] pediatric endocrine consultation was on 05/22/13 at age 60:  A. [name]: Term, emergency C-section for failure to progress. Birth weight: 7 lbs-15 oz. Healthy newborn.  B. Infancy: Healthy  C. Childhood: ADHD was diagnosed about age 16. Charles Beard was followed at [facility] and [facility]. Stimulants and non-stimulants did not work.  He also had severe anger issues. He sometimes bit himself or hit things, but had not hurt himself seriously. He was hit in the forehead by a pitched baseball at about age 37-8. There was no loss of consciousness. His only surgery was curettage of plantar warts. He had seasonal allergies, but no medication allergies.    D. Obesity: Mom had been concerned about his excess weight gain for several years. Mom tried to get him to eat healthy. According to mom, dad fed him whatever he wanted. Neither dad nor Domnick exercised.   E. Puberty delay: He had no axillary hair, pubic hair, or genital development.  F. Pertinent family history: Strong FH of obesity in mom and  maternal relatives. FH of T2DM in maternal grandmother. No FH of delayed puberty. Hypothyroidism in mom, MGM, MGGM. Neither mom nor MGM had thyroid surgery or irradiation. Menopause at age 1 in mom, MGM, and MGGM. Mental health issues on dad's side of family. Dad had severe mood swings. Dad was an alcoholic and PGF was an alcoholic. [Addendum 11/18/14: Mom stated that dad also used drugs. Mom and dad divorced when Charles Beard was age 2. Mom  also had depression.] [Addendum 05/11/17: Callahan accused his mother of frequently getting drunk at night. Mom said that she sometimes had two glasses of wine at night, but not every night. Kunta said that she drank a full bottle of wine each night.]  G. On exam, his BP was elevated at 122/87. He was quite agitated during the visit, supposedly because he was angry that mom was sharing his personal health information with yet another person. He was significantly overweight. He had a 19-20 gram goiter. His breasts were fatty with a Tanner stage 3 appearance and enlarged areolae, but no breast buds. His abdomen was enlarged. His pubic hair was Tanner stage 1. His testes were 2-3 mL in volume, very early pubertal. Lab tests showed normal CMP, TFTs, and TPO antibody levels. His FSH was 2.2, LH 0.3, testosterone 63, and estradiol 15.4, all c/w being in early puberty.  H. I diagnosed pubertal delay, linear growth delay, early gynecomastia, overweight, dyspepsia, goiter, and acanthosis. I instructed Bunnie and his mother on our Eat Right Diet and proper exercise techniques. I also started him on ranitidine, 150 mg, twice daily.   2. During the past five years [name] has had slow progression of puberty. As he has grown older, and despite stopping his psych medications,  he has also become more emotionally stable and less emotionally reactive. 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 He has had recurrent problems with cyclical vomiting syndrome, nausea, and abdominal pains. In the past two years  he has been progressively losing weight.   3. [name]'s last [facility] visit was on 04/04/18. At that visit I continued his Synthroid dose of 37.5 mcg/day on three days each week, but continued the 25 mcg/day for 4 days each week.   A. In the interim he has not had any new health issues or problems.   B. He has not missed many doses of Synthroid.   C. He still gets nausea now and  then, but much less often. His abdominal pain has resolved. He still gets hungry, but not as much as he used to. He saw [name], PA, at [facility] again on 02/07/17. She decreased his omeprazole to 20 mg each morning, but he does not take it very often. He sometimes takes melatonin, calcium, and MVI.  C. He stopped taking Strattera due to "feeling weird". He also stopped Focalin due to tachycardia. [name] no longer goes to Dr. [name]. He also stopped carbamazepine due to it making him "ugly".   D.  His separation anxiety is "easing off", but is still an issue. He has not been feeling depressed. He does not like to be in crowds. He pretty much stays at home or goes to his father's home. He spends most of his time playing video games. He stays up much of the night and sleeps in during much of the day.   E. He had psychological testing in the past. According to mom, [name] was found to have "a little bit of a low IQ". ADD was also noted. An individual IEP was developed. He did not finish high school.   F. He eats three times per day. Mom no longer restricts his sugar and starch intake due to fears about him becoming a diabetic.   G. He has had some pain in the area of his left medial meniscus   H. He walks every day for about 30 minutes each time.    3.  Pertinent Review of Systems:  Constitutional: He feels "pretty good".  Eyes: He says that he can see well without his glasses. He had his last eye exam on 06/01/15 and had a new pair of eye glasses, but he refuses to wear those glasses.There are no recognized eye problems. Mom has not yet scheduled him for a follow up eye exam.  Neck: The patient has no complaints of anterior neck swelling, soreness, tenderness, pressure, discomfort, or difficulty swallowing.   Heart: Heart rate increases with exercise or other physical activity. He has no complaints of palpitations, irregular heart beats, chest pain, or chest pressure.   Gastrointestinal: As above.  Sometimes his stomach growls. Bowel movements are good for the most part.  Legs: As above. Muscle mass and strength seem normal. There are no complaints of numbness, tingling, burning, or pain. No edema is noted.  Feet: There are no complaints of numbness, tingling, burning, or pain. No edema is noted. Neurologic: There are no recognized problems with muscle movement and strength, sensation, or coordination. GU: He has more pubic hair and axillary hair. He says that his genitalia are larger. Voice may be a little deeper.   PAST MEDICAL, FAMILY, AND SOCIAL HISTORY  Past Medical History:  Diagnosis Date  . ADHD (attention deficit hyperactivity disorder) 2000  . Anxiety   . Depression   . Headache(784.0)   . Hypothyroidism   . Prediabetes   . Puberty delay     Family History  Problem Relation Age of Onset  . Thyroid disease Mother   .  Diabetes Maternal Grandmother        and MGGM  . Heart disease Maternal Grandmother        and MGGM  . Breast cancer Other        and MGGM  . Thyroid disease Other        MGGM  . Colon cancer Neg Hx      Current Outpatient Medications:  .  levothyroxine (SYNTHROID, LEVOTHROID) 25 MCG tablet, TAKE 1 TABLET BY MOUTH DAILY FOR 4 DAYS EACH WEEK, BUT TAKE 1 & 1/2 TABLETS DAILY 3 DAYS EACH WEEK, Disp: 45 tablet, Rfl: 5 .  Melatonin 5 MG TABS, Take 5 mg by mouth daily., Disp: , Rfl:  .  amitriptyline (ELAVIL) 25 MG tablet, Take 1 tablet (25 mg total) by mouth at bedtime. (Patient not taking: Reported on 06/05/2018), Disp: 30 tablet, Rfl: 0 .  calcium carbonate (OS-CAL) 600 MG TABS tablet, Take 600 mg by mouth every evening. Reported on 04/05/2016, Disp: , Rfl:  .  carbamazepine (TEGRETOL XR) 100 MG 12 hr tablet, Take 100 mg by mouth 2 (two) times daily. Patient taking on as needed basis, Disp: , Rfl:  .  guanFACINE (INTUNIV) 2 MG TB24 ER tablet, Take 2 mg by mouth at bedtime., Disp: , Rfl:  .  loratadine (CLARITIN) 10 MG tablet, Take 10 mg by mouth daily as  needed for allergies. Reported on 04/05/2016, Disp: , Rfl:  .  Multiple Vitamin (MULTIVITAMIN WITH MINERALS) TABS tablet, Take 1 tablet by mouth daily., Disp: , Rfl:  .  omeprazole (PRILOSEC) 20 MG capsule, Take 1 capsule (20 mg total) by mouth daily. (Patient not taking: Reported on 06/05/2018), Disp: 30 capsule, Rfl: 8 .  ondansetron (ZOFRAN-ODT) 8 MG disintegrating tablet, Take 1 tablet (8 mg total) by mouth every 8 (eight) hours as needed for nausea or vomiting. (Patient not taking: Reported on 06/05/2018), Disp: 90 tablet, Rfl: 6 .  promethazine (PHENERGAN) 25 MG tablet, Take 1 tablet (25 mg total) by mouth every 8 (eight) hours as needed for nausea or vomiting. (Patient not taking: Reported on 06/05/2018), Disp: 90 tablet, Rfl: 1  Allergies as of 06/05/2018 - Review Complete 06/05/2018  Allergen Reaction Noted  . Reglan [metoclopramide] Other (See Comments) 03/28/2016     reports that he has never smoked. He has never used smokeless tobacco. He reports that he does not drink alcohol or use drugs. Pediatric History  Patient Guardian Status  . Mother:  Arleta Creekoole,Angela   Other Topics Concern  . Not on file  Social History Narrative   Lives at home with mom and 2 cats, visits dad every other weekend, will attend [employer], will start 9th grade in the fall.    1. School and Family: He is no longer trying to finish high school.  [name] lives with mom, but has been spending 1-2 visits a month with dad. His [place] Medicaid coverage will expire on September on his 22st birthday.   2. Activities: His physical activities have increased.   3. Primary Care Provider: Dr. Cheri RousJohn Sl[place]ky in [place], [facility], phone [phone] 4. Psychiatrist: None 5. GI: Ms Mike Gipmy Esterwood, PA at [facility]   REVIEW OF SYSTEMS: There are no other significant problems involving Davaun's other body systems.   Objective:   BP 112/76   Pulse 100   Ht 5' 8.62" (1.743 m)   Wt 137 lb 9.6 oz (62.4 kg)    BMI 20.54 kg/m    Ht Readings from Last  3 Encounters:  06/05/18 5' 8.62" (1.743 m)  04/04/18 5' 8.43" (1.738 m)  06/29/17 5\' 6"  (1.676 m) (10 %, Z= -1.28)*   * Growth percentiles are based on CDC (Boys, 2-20 Years) data.   Wt Readings from Last 3 Encounters:  06/05/18 137 lb 9.6 oz (62.4 kg)  04/04/18 130 lb 12.8 oz (59.3 kg)  06/29/17 130 lb 11.2 oz (59.3 kg) (12 %, Z= -1.17)*   * Growth percentiles are based on CDC (Boys, 2-20 Years) data.   HC Readings from Last 3 Encounters:  No data found for [facility]   Body surface area is 1.74 meters squared. Facility age limit for growth percentiles is 20 years. Facility age limit for growth percentiles is 20 years.    PHYSICAL EXAM:  Constitutional:   A. Demond is alert and more interactive today. He is still a bit withdrawn, but engaged much better today. His affect was still fairly flat. His insight was fair. Today was the most interactive and engaging visit that we have ever had. His height has increased a bit since his last visit. His weight has increased 7 pounds.   B. Today's visit was very calm and peaceful. Both [name] and mom were on their best behaviors. There were no arguments back and forth between [place]lane and mom.  Head: The head is normocephalic. Face: The face appears normal. There are no obvious dysmorphic features or plethora. Eyes: The eyes appear to be normally formed and spaced. Gaze is conjugate. There is no obvious arcus or proptosis. Eye moisture is normal.  Ears: The ears are normally placed and appear externally normal. Mouth: The oropharynx and tongue appear normal. Dentition appears to be normal for age. Mouth is a bit dry.  There is no hyperpigmentation. He has a grade I fine, blond mustache.  Neck: The neck appears to be visibly normal today. No carotid bruits are noted. His strap muscles are thicker. The thyroid gland is still enlarged at about 21 grams in size. Today the right lobe has shrunk back to normal, but the  left lobe is still enlarged. The consistency of the thyroid gland is normal today. The thyroid gland is not  tender to palpation. He has no acanthosis.  Lungs: The lungs are clear to auscultation. Air movement is good. Heart: Heart rate and rhythm are regular. Heart sounds S1 and S2 are normal. I did not appreciate any pathologic cardiac murmurs. Abdomen: The abdomen is normal in size. Bowel sounds are normal. He is not tender to palpation today. Arms: Muscle size and bulk are normal for age. Hands: There is no obvious tremor. Phalangeal and metacarpophalangeal joints are normal. Palmar muscles are normal for age. Palmar skin is normal. Palmar moisture is also normal. Fingernails are pale.  Legs: Muscles appear normal for age. No edema is present. Neurologic: Strength is normal for age in both the upper and lower extremities. His upper body strength has been gradually increasing over time. Muscle tone is normal. Sensation to touch is normal in both legs.   LAB DATA:   Results for orders placed or performed in visit on 04/04/18 (from the past 504 hour(s))  T3, free   Collection Time: 05/31/18 12:00 AM  Result Value Ref Range   T3, Free 4.0 3.0 - 4.7 pg/mL  T4, free   Collection Time: 05/31/18 12:00 AM  Result Value Ref Range   Free T4 1.1 0.8 - 1.4 ng/dL  Insulin-like growth factor   Collection Time: 05/31/18 12:00 AM  Result  Value Ref Range   IGF-I, LC/MS 256 83 - 456 ng/mL   Z-Score (Male) 0.1 -2.0 - 2 SD  C-peptide   Collection Time: 05/31/18 12:00 AM  Result Value Ref Range   C-Peptide 1.26 0.80 - 3.85 ng/mL  Testosterone Total,Free,Bio, Males   Collection Time: 05/31/18 12:00 AM  Result Value Ref Range   Testosterone 631 250 - 827 ng/dL   Albumin 4.2 3.6 - 5.1 g/dL   Sex Hormone Binding 47 10 - 50 nmol/L   Testosterone, Free 65.9 46.0 - 224.0 pg/mL   Testosterone, Bioavailable 126.9 110.0 - 575 ng/dL  TSH   Collection Time: 05/31/18 12:00 AM  Result Value Ref Range   TSH  1.68 0.40 - 4.50 mIU/L    Labs 05/31/18: TSH 1.68, free T4 1.1, free T3 4.0; IGF-1 256; testosterone 631  Labs 04/04/18: HbA1c 5.1%, CBG 79; TSH 2.07, free T4 1.1, free T3 4.0; testosterone 605, estradiol 28  08/09/17: TSH 3.18, free T4 1.1, free T3 3.6; C-peptide 2.03 (ref 0.80-3.85); testosterone 276  04/06/17: HbA1c 5.5%; TSH 2.28, free T4 1.2, free T3 3.7; testosterone 230; IGF-1 368, IGFBP-3 6.7 (ref 2.9-7.3)     12/29/16: HbA1c 5.1%; TSH 2.24, free T4 1.59; , free T3 39; IGF-1 262, IGFBP-3 5.9 (ref 2.9-7.3); LH 2.4, FSH 2.2, testosterone 204, estradiol <15   07/11/16: HbA1c 5.4%  05/25/16: TSH 1.561  05/24/16: HbA1c 5.4%, TSH 1.055  05/23/16: As above  05/18/16: LH 0.6, FSH 1.5, testosterone 23; TSH 1.73, free T4 1.0, free T3 2.9; IGF-1 79, IGFBP-3 4.0  04/05/16: Celiac panel and IgA both normal  02/23/16: HbA1c 5.7%  01/05/16: Urine showed small bilirubin; CMP normal, except for glucose 105 after vomiting; CBC with WBC 11.2;   11/25/15: HbA1c 5.6%  09/26/15: HbA1c 5.8%; IGF-1 110, IGFBP-3 4.0; LH 0.4, FSH 1.9, testosterone 30, free testosterone 3.6, estradiol 14.6; TSH 2.315, TPO antibody <1  06/18/15: Chromosomes 46, XY  06/09/15: HbA1c 5.8%; TSH 1.906, free T4 0.94, free T3 2.8; testosterone 24; IGF-1 110; IGFBP-3 5.0  02/27/15: HbA1c 5.7%; TSH 3.443, free T4 0.74, free T3 3.0; testosterone 29  10/28/14: Testosterone 21, IGF-1 119  08/14/14: TSH 2.835, free T4 0.97, free T3 3.6; LH 0.6, FSH 1.6, testosterone 18, estradiol < 11.8; IGF-1 108, IGFBP-3 3.8  06/18/14: testosterone 27, prolactin 3.8; TSH 3.106, free T4 0.93, free T3 3.0  11/20/13: TSH 0.5, free T4 0.99, free T3 3.4; LH 0.5, FSH 3.1, testosterone 18, estradiol < 11.8  07/15/13: CBC normal; CMP normal;   05/22/13: CMP normal; TSH 2.46, free T4 1.06, free T3 3.3, TPO antibody 11.2; FSH 2.2, LH 0.3, testosterone 63, estradiol 15.6  IMAGING:  Bone age 35/21/17: Bone age was read as 14 years at a chronologic age of 52  years and 7 months. I read the bone age as 61-6.   Bone age 35/23/14: Bone age 23 years at chronologic age 22 years, 10 months.   MRI of head 05/30/13: Normal pituitary gland and brain.   Assessment and Plan:   ASSESSMENT:  1. Puberty delay:   A. Zac's testes have gradually increased in size over time. His most recent testosterone value in August 2019 indicated that he has pretty much completed puberty, but he may have higher testosterone values over time.   2. Growth delay, linear: His height growth has increased in the past year. His epiphyses appear to still be open. He is taking in enough calories to support his linear growth and muscle growth.Marland Kitchen  3-5: Goiter/thyroiditis/hypothyroid:   A. His thyroid gland is about the same size today, but the lobes have shifted in size. The process of waxing and waning of thyroid gland size is c/w slowly evolving Hashimoto's thyroiditis.  His thyroiditis is clinically quiescent today.   B. His TFTs have varied over time and we have gradually increased his Synthroid doses over time. He is still on  A relatively low dose of Synthroid, which indicates that he has many more thyroid cells to lose.   C. His TFTs in August 2019 were normal on his current doses of Synthroid.  6-7. Overweight/unintentional weight loss: He is now gaining weight appropriately.  8-10. Nausea/reflux/dyspepsia/abdominal pain:   A. The nausea is "a little better". His reflux has resolved. His abdominal pain has resolved.   Ebbie Ridge and mom intend to return to [facility] for follow up.   11. Hypertension:  . His SBP is normal. His DBP is still elevated. He needs to exercise more.  12. Prediabetes :   A. His HbA1c was 5.7% on 02/27/15 when his BMI was 91.80%, 5.8% on 06/09/15 when his BMI was 87.39%, and 5.8% on 09/22/15 when his BMI was 81.79%. Thereafter his HbA1c values began to decrease. His HbA1c was 5.3% on 05/24/16. Since then his HbA1c values have varied between 5.2-5.5%.   B. His  HbA1c had decreased to 5.1% in June 2019. His C-peptide in October 2018 was mid-normal. His C-peptide in August 2019 was lower, but still normal. We need to continue to follow this issue over time. Given the autoimmune nature of his thyroiditis and hypothyroidism, it is possible that he could be slowly developing T1DM.  13-14: Anxiety/depression: He is doing much better.   PLAN:   1. Diagnostic: I reviewed his June and August lab results and clinical course.   2. Therapeutic: Convert to levothyroxine, 50 mcg tablets, 1/2 tab /day for 6 days per week and 1 tab on Sundays.  3. Patient education: We spent part of the visit discussing his thyroid issues and GI problems. However, we spent most of the visit discussing how to follow his medical issues after he loses his Medicaid coverage next month. I modified his thyroid hormone regimen to try to reduce costs for him.   4. Follow-up: 3 months.   Level of Service: This visit lasted in excess of 55 minutes. More than 50% of the visit was devoted to counseling.  Molli Knock, MD, CDE Adult and Pediatric Endocrinology

## 2018-06-05 NOTE — Patient Instructions (Signed)
Follow up visit in 3 months. 

## 2018-06-21 ENCOUNTER — Encounter (INDEPENDENT_AMBULATORY_CARE_PROVIDER_SITE_OTHER): Payer: Self-pay | Admitting: *Deleted

## 2018-07-17 ENCOUNTER — Ambulatory Visit (INDEPENDENT_AMBULATORY_CARE_PROVIDER_SITE_OTHER): Payer: Medicaid Other | Admitting: Gastroenterology

## 2018-07-17 ENCOUNTER — Other Ambulatory Visit (INDEPENDENT_AMBULATORY_CARE_PROVIDER_SITE_OTHER): Payer: Medicaid Other

## 2018-07-17 ENCOUNTER — Encounter: Payer: Self-pay | Admitting: Gastroenterology

## 2018-07-17 VITALS — BP 116/90 | HR 68 | Ht 66.0 in | Wt 136.4 lb

## 2018-07-17 DIAGNOSIS — R1110 Vomiting, unspecified: Secondary | ICD-10-CM

## 2018-07-17 DIAGNOSIS — R112 Nausea with vomiting, unspecified: Secondary | ICD-10-CM | POA: Diagnosis not present

## 2018-07-17 DIAGNOSIS — R110 Nausea: Secondary | ICD-10-CM

## 2018-07-17 LAB — COMPREHENSIVE METABOLIC PANEL
ALBUMIN: 4.4 g/dL (ref 3.5–5.2)
ALK PHOS: 128 U/L — AB (ref 39–117)
ALT: 10 U/L (ref 0–53)
AST: 12 U/L (ref 0–37)
BUN: 11 mg/dL (ref 6–23)
CO2: 28 mEq/L (ref 19–32)
Calcium: 9.4 mg/dL (ref 8.4–10.5)
Chloride: 106 mEq/L (ref 96–112)
Creatinine, Ser: 0.88 mg/dL (ref 0.40–1.50)
GFR: 116.19 mL/min (ref >60.00)
Glucose, Bld: 101 mg/dL — ABNORMAL HIGH (ref 70–99)
POTASSIUM: 4 meq/L (ref 3.5–5.1)
Sodium: 141 mEq/L (ref 135–145)
TOTAL PROTEIN: 6.9 g/dL (ref 6.0–8.3)
Total Bilirubin: 0.5 mg/dL (ref 0.2–1.2)

## 2018-07-17 LAB — CBC WITH DIFFERENTIAL/PLATELET
Basophils Absolute: 0.1 10*3/uL (ref 0.0–0.1)
Basophils Relative: 1 % (ref 0.0–3.0)
EOS PCT: 1.4 % (ref 0.0–5.0)
Eosinophils Absolute: 0.1 10*3/uL (ref 0.0–0.7)
HCT: 42.8 % (ref 39.0–52.0)
HEMOGLOBIN: 14.4 g/dL (ref 13.0–17.0)
Lymphocytes Relative: 35.6 % (ref 12.0–46.0)
Lymphs Abs: 2.4 10*3/uL (ref 0.7–4.0)
MCHC: 33.7 g/dL (ref 30.0–36.0)
MCV: 85.2 fl (ref 78.0–100.0)
MONOS PCT: 6.1 % (ref 3.0–12.0)
Monocytes Absolute: 0.4 10*3/uL (ref 0.1–1.0)
Neutro Abs: 3.8 10*3/uL (ref 1.4–7.7)
Neutrophils Relative %: 55.9 % (ref 43.0–77.0)
Platelets: 240 10*3/uL (ref 150.0–400.0)
RBC: 5.03 Mil/uL (ref 4.22–5.81)
RDW: 14.2 % (ref 11.5–14.6)
WBC: 6.8 10*3/uL (ref 4.5–10.5)

## 2018-07-17 LAB — T4, FREE: Free T4: 1.3 ng/dL (ref 0.8–1.4)

## 2018-07-17 LAB — TSH: TSH: 2.27 mIU/L (ref 0.40–4.50)

## 2018-07-17 LAB — T3, FREE: T3 FREE: 4.3 pg/mL (ref 3.0–4.7)

## 2018-07-17 MED ORDER — ONDANSETRON 8 MG PO TBDP: 8.0000 mg | ORAL_TABLET | Freq: Two times a day (BID) | ORAL | 3 refills | Status: DC

## 2018-07-17 MED ORDER — AMITRIPTYLINE HCL 25 MG PO TABS: ORAL_TABLET | ORAL | 0 refills | Status: DC

## 2018-07-17 MED ORDER — PROMETHAZINE HCL 25 MG PO TABS: 25.0000 mg | ORAL_TABLET | Freq: Two times a day (BID) | ORAL | 1 refills | Status: DC | PRN

## 2018-07-17 NOTE — Patient Instructions (Addendum)
If you are age 89 or younger, your body mass index should be between 19-25. Your Body mass index is 22.01 kg/m. If this is out of the aformentioned range listed, please consider follow up with your Primary Care Provider.   We have sent the following medications to your pharmacy for you to pick up at your convenience: Zofran 8 mg: take twice a day Phenergan 25 mg: take every 12 hours as needed  Elavil: Take 25mg  at night for 2 weeks, then take 50mg  at night thereafter  Please call in 6 weeks to update us on your symptoms  Thank you for entrusting me with your care and for choosing [facility], Dr. Ileene PatrickSteven Armbruster

## 2018-07-17 NOTE — Progress Notes (Signed)
HPI :  22 year old male here for follow-up for chronic nausea and vomiting. This is the first time of the pleasure of meeting him and his mother today, previously seen by APPs and evaluated by inpatient. He endorses a few years worth of chronic nausea with intermittent vomiting. He's had an extensive evaluation to include CT scan of the abdomen and pelvis, ultrasound of the abdomen, EGD (normal except small HH), gastric imaging study, MRI brain. Previously had 24-hour urine porphyrins which was normal. At one point in time he was using marijuana, he was told to stop this about a year ago and has abstained from it.  Patient also has several other comorbidities including diagnosis of hypothyroidism, delayed puberty, major depressive disorder, ADHD, generalized anxiety disorder, and hypertension. He is followed by an endocrinologist and a neuropsychologist. He had been on several psychotropic medications in the past.  He reports over the past year since stopping marijuana his symptoms have definitely improved although they continue to bother him to some extent. He feels nauseated every day, usually worse in the morning and at night, midday he tends to feel okay. He has some dry heaves where he vomits sporadically and is not related to eating, this occurs about 5 times per week. He thinks sometimes eating actually makes him feel a little bit better at times, he eats about 2 meals per day and able to keep it down. He denies any reflux which bothers him, he denies any abdominal pains. He does have headaches which bother him frequently every other day, he often lies down in take ibuprofen as needed for this. He's been on a variety of regimens in the past, he is currently using Zofran twice a day which she thinks helps some more than anything. With severe symptoms he may take an occasional Phenergan. He's previously been on PPI which hasn't helped this much. He was previously on Elavil dose to 25 mg, he thinks this  has helped him in the past to some extent, he does not recall ever being on a higher dose and he stopped it several months ago. Of note he had been on Tegretol and Intuniv in the past, both have been associated with nausea and vomiting, he does not endorse any improvement since stopping that regimen.   Past Medical History:  Diagnosis Date  . ADHD (attention deficit hyperactivity disorder) 2000  . Anxiety   . Depression   . Headache(784.0)   . Hypothyroidism   . Prediabetes   . Puberty delay      Past Surgical History:  Procedure Laterality Date  . ESOPHAGOGASTRODUODENOSCOPY (EGD) WITH PROPOFOL N/A 05/25/2016   Procedure: ESOPHAGOGASTRODUODENOSCOPY (EGD) WITH PROPOFOL;  Surgeon: Beverley Fiedler, MD;  Location: [facility];  Service: Endoscopy;  Laterality: N/A;  . PLANTAR'S WART EXCISION     Family History  Problem Relation Age of Onset  . Thyroid disease Mother   . Diabetes Maternal Grandmother        and MGGM  . Heart disease Maternal Grandmother        and MGGM  . Breast cancer Other        and MGGM  . Thyroid disease Other        MGGM  . Colon cancer Neg Hx    Social History   Tobacco Use  . Smoking status: Never Smoker  . Smokeless tobacco: Never Used  Substance Use Topics  . Alcohol use: No    Alcohol/week: 0.0 standard drinks  . Drug use: No  Current Outpatient Medications  Medication Sig Dispense Refill  . calcium carbonate (OS-CAL) 600 MG TABS tablet Take 600 mg by mouth as needed. Reported on 04/05/2016    . levothyroxine (SYNTHROID, LEVOTHROID) 50 MCG tablet Take one-half tablet per day on 6 days each week, but take one full tablet on sundays. 35 tablet 11  . loratadine (CLARITIN) 10 MG tablet Take 10 mg by mouth daily as needed for allergies. Reported on 04/05/2016    . Melatonin 5 MG TABS Take 5 mg by mouth as needed.     . Multiple Vitamin (MULTIVITAMIN WITH MINERALS) TABS tablet Take 1 tablet by mouth as needed.     . ondansetron (ZOFRAN-ODT) 8 MG  disintegrating tablet Take 1 tablet (8 mg total) by mouth every 8 (eight) hours as needed for nausea or vomiting. 90 tablet 6  . omeprazole (PRILOSEC) 20 MG capsule Take 1 capsule (20 mg total) by mouth daily. (Patient not taking: Reported on 06/05/2018) 30 capsule 8  . promethazine (PHENERGAN) 25 MG tablet Take 1 tablet (25 mg total) by mouth every 8 (eight) hours as needed for nausea or vomiting. (Patient not taking: Reported on 06/05/2018) 90 tablet 1   No current facility-administered medications for this visit.    Allergies  Allergen Reactions  . Reglan [Metoclopramide] Other (See Comments)    Seizure like activity     Review of Systems: All systems reviewed and negative except where noted in HPI.     Physical Exam: BP 116/90 (BP Location: Left Arm, Patient Position: Sitting, Cuff Size: Normal)   Pulse 68   Ht 5\' 6"  (1.676 m)   Wt 136 lb 6 oz (61.9 kg)   BMI 22.01 kg/m  Constitutional: Pleasant,well-developed, male in no acute distress. HEENT: Normocephalic and atraumatic. Conjunctivae are normal. No scleral icterus. Neck supple.  Cardiovascular: Normal rate, regular rhythm.  Pulmonary/chest: Effort normal and breath sounds normal. No wheezing, rales or rhonchi. Abdominal: Soft, nondistended, nontender.  There are no masses palpable. No hepatomegaly. Extremities: no edema Lymphadenopathy: No cervical adenopathy noted. Neurological: Alert and oriented to person place and time. Skin: Skin is warm and dry. No rashes noted. Psychiatric: Normal mood and affect. Behavior is normal.   ASSESSMENT AND PLAN: 22 year old male here for reassessment of the following issues:  Chronic nausea and vomiting - extensive negative evaluation as outlined above, including one hospitalization in the past. Marijuana had made this worse when using it, however symptoms persist despite abstaining although too a much milder degree. There was some thought that he had cyclical vomiting syndrome, however  his symptoms appear to bother him chronically on a daily basis. He does have ongoing headaches which could be related as well. I discussed options with the patient and his mother today. It's been a while since he's had basic labs, we'll send CBC and CMET to ensure stable. Otherwise given Zofran appears to work the best for him and allowing him to function, we'll continue 8 mg twice daily. If he has a particularly bad episode he can take Phenergan as needed. I otherwise discussed other options with him to include higher dose Elavil and Aprepitant which he has not yet tried. Given his headaches I think a TCA is reasonable, and he has responded positively to Elavil in the past however has not gotten to the dose range (>50mg ) which would be more likely to provide benefit. Given he is no longer on psychotropics, we will restart Elavil 25 mg daily at bedtime, then increase to 50 mg  after 2 weeks if tolerated. I asked him to call me in 6 weeks for reassessment. If he tolerates a 50 mg dose and is feeling well, we may increase it up to 100 mg. He should continue to completely abstain from marijuana. If Elavil does not work we may consider trial of aprepitant. He and mother agreed with plan as outlined.  Ileene Patrick, MD [facility]

## 2018-07-18 ENCOUNTER — Other Ambulatory Visit: Payer: Self-pay

## 2018-07-18 DIAGNOSIS — R748 Abnormal levels of other serum enzymes: Secondary | ICD-10-CM

## 2018-07-18 DIAGNOSIS — R1110 Vomiting, unspecified: Secondary | ICD-10-CM

## 2018-07-18 DIAGNOSIS — R110 Nausea: Secondary | ICD-10-CM

## 2018-07-18 NOTE — Progress Notes (Signed)
CMET and GGT order placed per result note

## 2018-07-19 ENCOUNTER — Encounter (INDEPENDENT_AMBULATORY_CARE_PROVIDER_SITE_OTHER): Payer: Self-pay | Admitting: *Deleted

## 2018-09-04 ENCOUNTER — Other Ambulatory Visit: Payer: Self-pay

## 2018-09-04 ENCOUNTER — Other Ambulatory Visit: Payer: Self-pay | Admitting: Gastroenterology

## 2018-09-04 MED ORDER — AMITRIPTYLINE HCL 50 MG PO TABS: 50.0000 mg | ORAL_TABLET | Freq: Every day | ORAL | 3 refills | Status: DC

## 2018-09-04 NOTE — Telephone Encounter (Signed)
Patients mother states patient needs a refill of medication amitriptyline 50mg  sent to [place] [facility]. Patient mother states this medication is seeming to help some at the 50mg  amount but it makes the patient sleepy. Patient no longer has insurance so pt mother didn't think changing medication will be an option.

## 2018-09-04 NOTE — Telephone Encounter (Signed)
Okay let's refill it at 50mg  / day if helping. We had hoped to increase the dose but sounds like he won't tolerate it. Can give him 90 day supply with 2 refills. Thanks

## 2018-09-04 NOTE — Telephone Encounter (Signed)
Left message to return call. Amitriptyline 50mg  #90 with 2 refills sent to [facility]. Need to inform.

## 2018-09-04 NOTE — Telephone Encounter (Signed)
Dr Armbruster, please advise... 

## 2018-09-04 NOTE — Telephone Encounter (Signed)
Left message on home number informing that prescription was sent in.

## 2018-09-12 ENCOUNTER — Ambulatory Visit (INDEPENDENT_AMBULATORY_CARE_PROVIDER_SITE_OTHER): Payer: Medicaid Other | Admitting: "Endocrinology

## 2018-10-01 ENCOUNTER — Telehealth: Payer: Self-pay | Admitting: *Deleted

## 2018-10-01 NOTE — Telephone Encounter (Signed)
Left voicemail reminding patient he is due for labs.

## 2018-10-01 NOTE — Telephone Encounter (Signed)
-----   Message from Cooper RenderJanet M Hogan, CMA sent at 07/18/2018 10:37 AM EDT ----- Regarding: labs due in early Dec Pt needs to go to lab for recheck (CMET and GGT) for elevated alkaline phosphatase level noted in Sept at office visit. Orders entered.

## 2018-10-04 NOTE — Telephone Encounter (Signed)
Dr Adela LankArmbruster- Just an FYI.Marland Kitchen..Marland Kitchen

## 2018-10-04 NOTE — Telephone Encounter (Signed)
Pt mother called states they don't have medical insurance and she is now unemployed

## 2018-10-04 NOTE — Telephone Encounter (Signed)
Okay, sorry to hear this. He can get this done when he is able to once he has insurance, he can contact us. Thanks

## 2019-12-06 ENCOUNTER — Encounter: Payer: Self-pay | Admitting: Gastroenterology

## 2019-12-06 ENCOUNTER — Telehealth: Payer: Self-pay | Admitting: Gastroenterology

## 2019-12-06 ENCOUNTER — Other Ambulatory Visit (INDEPENDENT_AMBULATORY_CARE_PROVIDER_SITE_OTHER): Payer: Self-pay

## 2019-12-06 ENCOUNTER — Ambulatory Visit (INDEPENDENT_AMBULATORY_CARE_PROVIDER_SITE_OTHER): Payer: Self-pay | Admitting: Gastroenterology

## 2019-12-06 VITALS — BP 88/56 | HR 53 | Temp 97.8°F | Ht 70.0 in | Wt 164.2 lb

## 2019-12-06 DIAGNOSIS — R519 Headache, unspecified: Secondary | ICD-10-CM

## 2019-12-06 DIAGNOSIS — G8929 Other chronic pain: Secondary | ICD-10-CM

## 2019-12-06 DIAGNOSIS — R112 Nausea with vomiting, unspecified: Secondary | ICD-10-CM

## 2019-12-06 DIAGNOSIS — R748 Abnormal levels of other serum enzymes: Secondary | ICD-10-CM

## 2019-12-06 LAB — GAMMA GT: GGT: 12 U/L (ref 7–51)

## 2019-12-06 LAB — CBC WITH DIFFERENTIAL/PLATELET
Basophils Absolute: 0.1 10*3/uL (ref 0.0–0.1)
Basophils Relative: 0.8 % (ref 0.0–3.0)
Eosinophils Absolute: 0.1 10*3/uL (ref 0.0–0.7)
Eosinophils Relative: 1.3 % (ref 0.0–5.0)
HCT: 48.2 % (ref 39.0–52.0)
Hemoglobin: 16.2 g/dL (ref 13.0–17.0)
Lymphocytes Relative: 34.9 % (ref 12.0–46.0)
Lymphs Abs: 3.2 10*3/uL (ref 0.7–4.0)
MCHC: 33.7 g/dL (ref 30.0–36.0)
MCV: 86.5 fl (ref 78.0–100.0)
Monocytes Absolute: 0.6 10*3/uL (ref 0.1–1.0)
Monocytes Relative: 6.8 % (ref 3.0–12.0)
Neutro Abs: 5.1 10*3/uL (ref 1.4–7.7)
Neutrophils Relative %: 56.2 % (ref 43.0–77.0)
Platelets: 264 10*3/uL (ref 150.0–400.0)
RBC: 5.57 Mil/uL (ref 4.22–5.81)
RDW: 13.3 % (ref 11.5–15.5)
WBC: 9.1 10*3/uL (ref 4.0–10.5)

## 2019-12-06 LAB — HEPATIC FUNCTION PANEL
ALT: 16 U/L (ref 0–53)
AST: 16 U/L (ref 0–37)
Albumin: 4.8 g/dL (ref 3.5–5.2)
Alkaline Phosphatase: 109 U/L (ref 39–117)
Bilirubin, Direct: 0.1 mg/dL (ref 0.0–0.3)
Total Bilirubin: 0.4 mg/dL (ref 0.2–1.2)
Total Protein: 7.7 g/dL (ref 6.0–8.3)

## 2019-12-06 LAB — TSH: TSH: 3.95 u[IU]/mL (ref 0.35–4.50)

## 2019-12-06 LAB — HEMOGLOBIN A1C: Hgb A1c MFr Bld: 5.7 % (ref 4.6–6.5)

## 2019-12-06 MED ORDER — PROMETHAZINE HCL 12.5 MG PO TABS: 12.5000 mg | ORAL_TABLET | Freq: Three times a day (TID) | ORAL | 1 refills | Status: DC | PRN

## 2019-12-06 MED ORDER — ONDANSETRON 8 MG PO TBDP: 8.0000 mg | ORAL_TABLET | Freq: Two times a day (BID) | ORAL | 1 refills | Status: DC

## 2019-12-06 MED ORDER — NORTRIPTYLINE HCL 10 MG PO CAPS: ORAL_CAPSULE | ORAL | 1 refills | Status: DC

## 2019-12-06 NOTE — Patient Instructions (Addendum)
If you are age 23 or older, your body mass index should be between 23-30. Your Body mass index is 23.57 kg/m. If this is out of the aforementioned range listed, please consider follow up with your Primary Care Provider.  If you are age 48 or younger, your body mass index should be between 19-25. Your Body mass index is 23.57 kg/m. If this is out of the aformentioned range listed, please consider follow up with your Primary Care Provider.   Due to recent changes in healthcare laws, you may see the results of your imaging and laboratory studies on MyChart before your provider has had a chance to review them.  We understand that in some cases there may be results that are confusing or concerning to you. Not all laboratory results come back in the same time frame and the provider may be waiting for multiple results in order to interpret others.  Please give [place] 48 hours in order for your provider to thoroughly review all the results before contacting the office for clarification of your results.   Please go to the lab in the basement of our building to have lab work done as you leave today. Hit "B" for basement when you get on the elevator.  When the doors open the lab is on your left.  We will call you with the results. Thank you.  We have sent the following medications to your pharmacy for you to pick up at your convenience: Zofran 8mg  ODT: Take twice a day Phenergan 12.5mg : Take every 8 hours  Nortriptyline 10mg : Take one tablet at bedtime for 2 weeks then increase to 2 tablets at bedtime thereafter  Discontinue Elavil.  We will refer you to [facility] for chronic headaches. They will contact you to schedule an appointment.  Please let know if you have not heard from them within a couple of weeks and we will follow up with them on your behalf.  Please call [facility] in 6 weeks to let us know how you are doing.  Thank you for entrusting me with your care and for choosing [facility], Dr.  [place]

## 2019-12-06 NOTE — Telephone Encounter (Signed)
Prescriptions sent to [facility] in [place].  Cancelled scripts sent to [facility].

## 2019-12-06 NOTE — Telephone Encounter (Signed)
Pt's mother requested the three prescriptions to be resent to [facility] in [place].

## 2019-12-06 NOTE — Progress Notes (Signed)
HPI :  24 year old male with a history of chronic nausea and vomiting, here for follow-up for that issue.  He was last seen in September 2019.  Please see prior issues for full details of his history.  He is accompanied by his mother today who helps provide some history.  He has had longstanding chronic nausea with intermittent vomiting.  Extensive evaluation to include CT scan of the abdomen and pelvis, ultrasound of the abdomen, EGD (normal except small HH), gastric imaging study, MRI brain. Previously had 24-hour urine porphyrins which was normal. At one point in time he was using marijuana, he was told to stop this about 2 year ago and has abstained from it.   This has not made any difference in his symptoms. Patient also has several other comorbidities including diagnosis of hypothyroidism, delayed puberty, major depressive disorder, ADHD, generalized anxiety disorder, and hypertension. He is followed by an endocrinologist and a neuropsychologist. He had been on several psychotropic medications in the past.  He states he continues to feel poorly.  He previously had been on a low-dose of Elavil which did help things for him to some extent at 25 mg nightly.  We had tried increasing the dose to 50 mg nightly however it made him extremely drowsy.  In general he just did not even tolerate it anymore at the 25 mg dose and ended up stopping it.  He states he is essentially nauseated all the time, never really goes away.  Sometimes it can get worse after he eats something.  He vomits about once every 3 days at baseline.  He states it is often in the morning when he wakes up he will have episodes of dry heaving.  Mother states rarely after he eats he may have some episodes of vomiting.  He has been taking Zofran at 8 mg ODT which does help him, he takes it once to twice a day.  He does not take Phenergan as much but he does get some benefit from it.  He does endorse chronic daily headaches which definitely make  his nausea worse.  Headaches are poorly controlled.  He uses Advil as needed for these, but does not use routinely.  He does have some heartburn at times but not too much.  He has been given a trial of multiple PPIs and antacids in the past which has not helped at all.  Mother thinks he is suffering from some anxiety and depression which could be contributing.  He is never seen a neurologist for the headaches.  We discussed other options for management.    Past Medical History:  Diagnosis Date  . ADHD (attention deficit hyperactivity disorder) 2000  . Anxiety   . Chronic nausea   . Depression   . Headache(784.0)   . Hypothyroidism   . Prediabetes   . Puberty delay      Past Surgical History:  Procedure Laterality Date  . ESOPHAGOGASTRODUODENOSCOPY (EGD) WITH PROPOFOL N/A 05/25/2016   Procedure: ESOPHAGOGASTRODUODENOSCOPY (EGD) WITH PROPOFOL;  Surgeon: Jerene Bears, MD;  Location: [facility];  Service: Endoscopy;  Laterality: N/A;  . PLANTAR'S WART EXCISION     Family History  Problem Relation Age of Onset  . Thyroid disease Mother   . Diabetes Maternal Grandmother        and MGGM  . Heart disease Maternal Grandmother        and MGGM  . Breast cancer Other        and MGGM  .  Thyroid disease Other        MGGM  . Colon cancer Neg Hx    Social History   Tobacco Use  . Smoking status: Never Smoker  . Smokeless tobacco: Never Used  Substance Use Topics  . Alcohol use: No    Alcohol/week: 0.0 standard drinks  . Drug use: No   Current Outpatient Medications  Medication Sig Dispense Refill  . calcium carbonate (OS-CAL) 600 MG TABS tablet Take 600 mg by mouth as needed. Reported on 04/05/2016    . Multiple Vitamin (MULTIVITAMIN WITH MINERALS) TABS tablet Take 1 tablet by mouth as needed.     Marland Kitchen levothyroxine (SYNTHROID, LEVOTHROID) 50 MCG tablet Take one-half tablet per day on 6 days each week, but take one full tablet on sundays. 35 tablet 11  . nortriptyline (PAMELOR) 10 MG  capsule Take 1 tablet (10mg ) by mouth every night at bedtime for 2 weeks. Then increase to 2 tablets (20mg ) every night at bedtime 60 capsule 1  . ondansetron (ZOFRAN-ODT) 8 MG disintegrating tablet Take 1 tablet (8 mg total) by mouth 2 (two) times daily. 180 tablet 1  . promethazine (PHENERGAN) 12.5 MG tablet Take 1 tablet (12.5 mg total) by mouth every 8 (eight) hours as needed for nausea or vomiting. 180 tablet 1   No current facility-administered medications for this visit.   Allergies  Allergen Reactions  . Reglan [Metoclopramide] Other (See Comments)    Seizure like activity     Review of Systems: All systems reviewed and negative except where noted in HPI.    No results found.  Physical Exam: BP (!) 88/56 (BP Location: Right Arm, Patient Position: Sitting, Cuff Size: Normal)   Pulse (!) 53   Temp 97.8 F (36.6 C) (Oral)   Ht 5\' 10"  (1.778 m)   Wt 164 lb 4 oz (74.5 kg)   BMI 23.57 kg/m  Constitutional: Pleasant,well-developed, male in no acute distress. HEENT: Normocephalic and atraumatic. Conjunctivae are normal. No scleral icterus. Neck supple.  Cardiovascular: Normal rate, regular rhythm.  Pulmonary/chest: Effort normal and breath sounds normal. No wheezing, rales or rhonchi. Abdominal: Soft, nondistended, nontender. There are no masses palpable.  Extremities: no edema Lymphadenopathy: No cervical adenopathy noted. Neurological: Alert and oriented to person place and time. Skin: Skin is warm and dry. No rashes noted. Psychiatric: Normal mood and affect. Behavior is normal.   ASSESSMENT AND PLAN: 24 year old male here for reassessment of the following  Chronic nausea and vomiting / chronic headaches / elevated AP - extensive negative evaluation for nausea and vomiting as outlined above, including one hospitalization in the past. Symptoms persist despite cessation of marijuana a few years ago.  He has severe chronic headaches which could be related to this  presentation, he also has some anxiety and depression associated with this.  He has not been able to work due to his chronic symptoms, though I have not seen him back for reassessment in 14 months.  We discussed options moving forward.  Zofran definitely helps him throughout the day, he can continue to take that as needed.  He initially had some benefit from low-dose Elavil but having drowsiness related to it over time and ultimately stopped it.  I discussed options, I will try him on nortriptyline to see if this is better tolerated and will help minimize his nausea and see if it will provide some benefit for his headaches as well.  We will start 10 mg nightly for 2 weeks then increase to 20 mg  nightly if tolerated.  Hopefully he does better with this as it is less sedating.  I will also refill his Phenergan to use as needed.  For his chronic headaches I will refer him to neurology for further management.  He has not had basic labs in a little while and has a history of an elevated alkaline phosphatase, will repeat LFTs with GGT, obtain CBC, and obtain TSH and hemoglobin A1c per his mother's request.  We will let him know results of labs and await his course on the nortriptyline.  I asked him to touch base with me in 6 weeks or so to assess response of the nortriptyline.  If not helping we will consider other options.  He agreed  I spent 30 minutes of time, including in depth chart review, independent review of results as outlined above, communicating results with the patient directly, face-to-face time with the patient, coordinating care, and ordering studies and medications as appropriate, and documentation.  Ileene Patrick, MD [facility]

## 2019-12-27 ENCOUNTER — Encounter: Payer: Self-pay | Admitting: Neurology

## 2020-01-29 ENCOUNTER — Telehealth: Payer: Self-pay | Admitting: Gastroenterology

## 2020-01-29 NOTE — Telephone Encounter (Signed)
Patient's mother called requesting refill on Levothyroxine although it was given by Dr. Fransico Michael

## 2020-01-29 NOTE — Telephone Encounter (Signed)
Called and spoke to pt's mother. I let her know that Dr. Adela Lank does not prescribe that medication and she will need to call Dr. Juluis Mire office.  She expressed understanding.

## 2020-01-29 NOTE — Telephone Encounter (Signed)
Pt's mother called again to request a 90-day supply sent to [facility] in [place] if prescription can be refilled.

## 2020-02-05 ENCOUNTER — Telehealth (INDEPENDENT_AMBULATORY_CARE_PROVIDER_SITE_OTHER): Payer: Self-pay | Admitting: "Endocrinology

## 2020-02-05 NOTE — Telephone Encounter (Signed)
Patient is scheduled for the next available with Fransico Michael. Please refill RX below if possible.

## 2020-02-05 NOTE — Telephone Encounter (Signed)
Called patients mom back. Left HIPPA approved message for mom or Xyler to call and make an appointment before we can refill any medications. His last visit was 05-2018.

## 2020-02-05 NOTE — Telephone Encounter (Signed)
  Who's calling (name and relationship to patient) : Charles Beard -Mom   Best contact number: [phone]  Provider they see: Dr Fransico Michael   Reason for call: Mom called to have an RX Refill     PRESCRIPTION REFILL ONLY  Name of prescription: Levothyroxine   Pharmacy: [facility]  [address]  [place]

## 2020-02-14 ENCOUNTER — Emergency Department (HOSPITAL_COMMUNITY)
Admission: EM | Admit: 2020-02-14 | Discharge: 2020-02-14 | Disposition: A | Discharge status: Home / Self Care | Payer: Medicaid Other | Attending: Emergency Medicine | Admitting: Emergency Medicine

## 2020-02-14 ENCOUNTER — Telehealth: Payer: Self-pay | Admitting: Gastroenterology

## 2020-02-14 ENCOUNTER — Encounter (HOSPITAL_COMMUNITY): Payer: Self-pay

## 2020-02-14 ENCOUNTER — Other Ambulatory Visit: Payer: Self-pay

## 2020-02-14 DIAGNOSIS — Z79899 Other long term (current) drug therapy: Secondary | ICD-10-CM | POA: Insufficient documentation

## 2020-02-14 DIAGNOSIS — E063 Autoimmune thyroiditis: Secondary | ICD-10-CM

## 2020-02-14 DIAGNOSIS — R1115 Cyclical vomiting syndrome unrelated to migraine: Secondary | ICD-10-CM | POA: Insufficient documentation

## 2020-02-14 DIAGNOSIS — E039 Hypothyroidism, unspecified: Secondary | ICD-10-CM | POA: Insufficient documentation

## 2020-02-14 LAB — CBC
HCT: 43.6 % (ref 39.0–52.0)
Hemoglobin: 14.5 g/dL (ref 13.0–17.0)
MCH: 29.2 pg (ref 26.0–34.0)
MCHC: 33.3 g/dL (ref 30.0–36.0)
MCV: 87.7 fL (ref 80.0–100.0)
Platelets: 289 10*3/uL (ref 150–400)
RBC: 4.97 MIL/uL (ref 4.22–5.81)
RDW: 12.8 % (ref 11.5–15.5)
WBC: 8.3 10*3/uL (ref 4.0–10.5)
nRBC: 0 % (ref 0.0–0.2)

## 2020-02-14 LAB — COMPREHENSIVE METABOLIC PANEL
ALT: 15 U/L (ref 0–44)
AST: 17 U/L (ref 15–41)
Albumin: 4.3 g/dL (ref 3.5–5.0)
Alkaline Phosphatase: 102 U/L (ref 38–126)
Anion gap: 10 (ref 5–15)
BUN: 11 mg/dL (ref 6–20)
CO2: 27 mmol/L (ref 22–32)
Calcium: 9.2 mg/dL (ref 8.9–10.3)
Chloride: 103 mmol/L (ref 98–111)
Creatinine, Ser: 1.02 mg/dL (ref 0.61–1.24)
GFR calc Af Amer: >60 mL/min (ref >60)
GFR calc non Af Amer: >60 mL/min (ref >60)
Glucose, Bld: 104 mg/dL — ABNORMAL HIGH (ref 70–99)
Potassium: 3.9 mmol/L (ref 3.5–5.1)
Sodium: 140 mmol/L (ref 135–145)
Total Bilirubin: 0.5 mg/dL (ref 0.3–1.2)
Total Protein: 7.2 g/dL (ref 6.5–8.1)

## 2020-02-14 LAB — URINALYSIS, ROUTINE W REFLEX MICROSCOPIC
Bilirubin Urine: NEGATIVE
Glucose, UA: NEGATIVE mg/dL
Hgb urine dipstick: NEGATIVE
Ketones, ur: 20 mg/dL — AB
Leukocytes,Ua: NEGATIVE
Nitrite: NEGATIVE
Protein, ur: NEGATIVE mg/dL
Specific Gravity, Urine: 1.009 (ref 1.005–1.030)
pH: 6 (ref 5.0–8.0)

## 2020-02-14 LAB — LIPASE, BLOOD: Lipase: 18 U/L (ref 11–51)

## 2020-02-14 LAB — RAPID URINE DRUG SCREEN, HOSP PERFORMED
Amphetamines: NOT DETECTED
Barbiturates: NOT DETECTED
Benzodiazepines: NOT DETECTED
Cocaine: NOT DETECTED
Opiates: NOT DETECTED
Tetrahydrocannabinol: POSITIVE — AB

## 2020-02-14 MED ORDER — LEVOTHYROXINE SODIUM 50 MCG PO TABS: 25.0000 ug | ORAL_TABLET | ORAL | 0 refills | Status: DC

## 2020-02-14 MED ORDER — SUCRALFATE 1 G PO TABS: 1.0000 g | ORAL_TABLET | Freq: Three times a day (TID) | ORAL | 0 refills | Status: DC

## 2020-02-14 MED ORDER — SODIUM CHLORIDE 0.9% FLUSH: 3.0000 mL | Freq: Once | INTRAVENOUS | Status: DC

## 2020-02-14 MED ORDER — SODIUM CHLORIDE 0.9 % IV BOLUS
1000.0000 mL | Freq: Once | INTRAVENOUS | Status: AC
  Administered 2020-02-14: 1000 mL via INTRAVENOUS

## 2020-02-14 MED ORDER — ONDANSETRON HCL 4 MG/2ML IJ SOLN
4.0000 mg | Freq: Once | INTRAMUSCULAR | Status: AC
  Administered 2020-02-14: 4 mg via INTRAVENOUS
  Filled 2020-02-14: qty 2

## 2020-02-14 NOTE — Discharge Instructions (Signed)
Take Carafate as directed for the next 5 days for gastritis Continue your home meds prescribed to you for nausea Please follow up with Dr. Adela Lank

## 2020-02-14 NOTE — Telephone Encounter (Signed)
Agree, if he has not been able to keep anything down for 24 hours despite his antiemetic and has vomited blood he should go to the ED for evaluation. Thanks

## 2020-02-14 NOTE — Telephone Encounter (Signed)
Patient's mom notified that he will need to go to the ED  For fluids and IV antiemetics.

## 2020-02-14 NOTE — ED Provider Notes (Signed)
[facility]-EMERGENCY DEPT Provider Note   CSN: [id number] Arrival date & time: 02/14/20  1254     History Chief Complaint  Patient presents with  . Emesis    Charles Beard is a 24 y.o. male with cyclical vomiting who presents with nausea and vomiting. Patient states that since yesterday he has had intractable nausea and vomiting. Emesis is bilious in nature. He states that he had emesis with streaks of blood yesterday but this is resolved today. He denies fever, chills, chest pain, shortness of breath, abdominal pain, diarrhea, constipation, urinary symptoms. He denies any drug or alcohol use. They are followed by Dr. Adela Lank with GI and he has had extensive testing with him. He was recently started on nortriptyline which she states has overall been helping him. He was referred to the ED for IV fluids and antiemetics due to his mom reporting to the GI doctor that he was vomiting blood. He takes Zofran and Phenergan which provide temporary relief.   HPI     Past Medical History:  Diagnosis Date  . ADHD (attention deficit hyperactivity disorder) 2000  . Anxiety   . Chronic nausea   . Depression   . Headache(784.0)   . Hypothyroidism   . Prediabetes   . Puberty delay     Patient Active Problem List   Diagnosis Date Noted  . Abdominal pain, chronic, epigastric 07/12/2016  . Esophageal reflux 05/24/2016  . Chronic nausea 05/24/2016  . Intractable nausea and vomiting 05/24/2016  . Cyclical vomiting with nausea 05/24/2016  . Elevated hemoglobin A1c 03/12/2015  . Essential hypertension, benign 03/12/2015  . 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 Dyspepsia 11/21/2013  . 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 MDD (major depressive disorder), single episode, severe , no psychosis (HCC) 06/15/2013  . Suicide (HCC) 06/15/2013  . ADHD (attention deficit hyperactivity disorder), combined type 06/15/2013  . GAD (generalized anxiety disorder)  06/15/2013  . Puberty delay 05/23/2013  . Delayed linear growth 05/23/2013  . Overweight peds (BMI 85-94.9 percentile) 05/23/2013  . Goiter 05/23/2013  . Acanthosis nigricans, acquired 05/23/2013  . Gynecomastia, male 05/23/2013    Past Surgical History:  Procedure Laterality Date  . ESOPHAGOGASTRODUODENOSCOPY (EGD) WITH PROPOFOL N/A 05/25/2016   Procedure: ESOPHAGOGASTRODUODENOSCOPY (EGD) WITH PROPOFOL;  Surgeon: Beverley Fiedler, MD;  Location: [facility];  Service: Endoscopy;  Laterality: N/A;  . PLANTAR'S WART EXCISION         Family History  Problem Relation Age of Onset  . Thyroid disease Mother   . Diabetes Maternal Grandmother        and MGGM  . Heart disease Maternal Grandmother        and MGGM  . Breast cancer Other        and MGGM  . Thyroid disease Other        MGGM  . Colon cancer Neg Hx     Social History   Tobacco Use  . Smoking status: Never Smoker  . Smokeless tobacco: Never Used  Substance Use Topics  . Alcohol use: No    Alcohol/week: 0.0 standard drinks  . Drug use: No    Home Medications Prior to Admission medications   Medication Sig Start Date End Date Taking? Authorizing Provider  calcium carbonate (OS-CAL) 600 MG TABS tablet Take 600 mg by mouth as needed. Reported on 04/05/2016    [provider]  levothyroxine (SYNTHROID, LEVOTHROID) 50 MCG tablet Take one-half tablet per day on 6 days each week, but take one  full tablet on sundays. 06/05/18 06/06/19  [name], MD  Multiple Vitamin (MULTIVITAMIN WITH MINERALS) TABS tablet Take 1 tablet by mouth as needed.     [provider]  nortriptyline (PAMELOR) 10 MG capsule Take 1 tablet (10mg ) by mouth every night at bedtime for 2 weeks. Then increase to 2 tablets (20mg ) every night at bedtime 12/06/19   Armbruster, , MD  ondansetron (ZOFRAN-ODT) 8 MG disintegrating tablet Take 1 tablet (8 mg total) by mouth 2 (two) times daily. 12/06/19   Armbruster, Willaim Rayas, MD    promethazine (PHENERGAN) 12.5 MG tablet Take 1 tablet (12.5 mg total) by mouth every 8 (eight) hours as needed for nausea or vomiting. 12/06/19   Armbruster, Willaim Rayas, MD    Allergies    Reglan [metoclopramide]  Review of Systems   Review of Systems  Constitutional: Negative for chills and fever.  Respiratory: Negative for shortness of breath.   Cardiovascular: Negative for chest pain.  Gastrointestinal: Positive for nausea and vomiting. Negative for abdominal pain, constipation and diarrhea.  Genitourinary: Negative for dysuria and flank pain.  All other systems reviewed and are negative.   Physical Exam Updated Vital Signs BP (!) 146/96 (BP Location: Left Arm)   Pulse 63   Temp 98.9 F (37.2 C) (Oral)   Resp 18   Ht 5\' 10"  (1.778 m)   Wt 73.3 kg   SpO2 100%   BMI 23.18 kg/m   Physical Exam Vitals and nursing note reviewed.  Constitutional:      General: He is not in acute distress.    Appearance: Normal appearance. He is well-developed. He is not ill-appearing.     Comments: Calm and cooperative. NAD  HENT:     Head: Normocephalic and atraumatic.  Eyes:     General: No scleral icterus.       Right eye: No discharge.        Left eye: No discharge.     Conjunctiva/sclera: Conjunctivae normal.     Pupils: Pupils are equal, round, and reactive to light.  Cardiovascular:     Rate and Rhythm: Normal rate and regular rhythm.  Pulmonary:     Effort: Pulmonary effort is normal. No respiratory distress.     Breath sounds: Normal breath sounds.  Abdominal:     General: There is no distension.     Palpations: Abdomen is soft.     Tenderness: There is no abdominal tenderness.  Musculoskeletal:     Cervical back: Normal range of motion.  Skin:    General: Skin is warm and dry.  Neurological:     Mental Status: He is alert and oriented to person, place, and time.  Psychiatric:        Behavior: Behavior normal.     ED Results / Procedures / Treatments   Labs (all  labs ordered are listed, but only abnormal results are displayed) Labs Reviewed  COMPREHENSIVE METABOLIC PANEL - Abnormal; Notable for the following components:      Result Value   Glucose, Bld 104 (*)    All other components within normal limits  URINALYSIS, ROUTINE W REFLEX MICROSCOPIC - Abnormal; Notable for the following components:   Color, Urine STRAW (*)    Ketones, ur 20 (*)    All other components within normal limits  RAPID URINE DRUG SCREEN, HOSP PERFORMED - Abnormal; Notable for the following components:   Tetrahydrocannabinol POSITIVE (*)    All other components within normal limits  LIPASE, BLOOD  CBC    EKG None  Radiology No results found.  Procedures Procedures (including critical care time)  Medications Ordered in ED Medications  sodium chloride flush (NS) 0.9 % injection 3 mL (has no administration in time range)  sodium chloride 0.9 % bolus 1,000 mL (1,000 mLs Intravenous New Bag/Given 02/14/20 1816)  ondansetron (ZOFRAN) injection 4 mg (4 mg Intravenous Given 02/14/20 1815)    ED Course  I have reviewed the triage vital signs and the nursing notes.  Pertinent labs & imaging results that were available during my care of the patient were reviewed by me and considered in my medical decision making (see chart for details).  24 year old male presents with intractable nausea and vomiting since yesterday.  This has been a longstanding issue and has had extensive work-up by GI.  Normally symptoms are controlled with Zofran and Phenergan.  He is hypertensive but otherwise vital signs are normal.  Heart is regular rate and rhythm.  Lungs are clear to auscultation.  Abdomen is soft and nontender.  He is in no acute distress and alert and oriented.  Blood work obtained in triage is normal.  Patient denies using marijuana however UDS is positive for THC.  Patient was given IV fluids and Zofran and feels much better.  He is p.o. challenged and tolerated well.  Mom is  requesting a refill for his thyroid medicine until he can get into his endocrinologist which was provided.  We will also prescribe some Carafate due to reported blood in emesis.  Return precautions given  MDM Rules/Calculators/A&P                       Final Clinical Impression(s) / ED Diagnoses Final diagnoses:  Cyclical vomiting    Rx / DC Orders ED Discharge Orders    None       Recardo Evangelist, [name] 02/14/20 2020    Tegeler, Gwenyth Allegra, MD 02/15/20 [phone]

## 2020-02-14 NOTE — ED Triage Notes (Signed)
Patient c/o emesis since yesterday. Patient denies any abdominal pain or diarrhea. 

## 2020-02-19 NOTE — Progress Notes (Signed)
NEUROLOGY CONSULTATION NOTE  [name] MRN: [id number] DOB: 11-24-1995  Referring provider: Ileene Patrick, MD Primary care provider: Cheri Rous, MD  Reason for consult:  headaches  HISTORY OF PRESENT ILLNESS: Charles Beard. Charles Beard is a 24 year old male with chronic nausea and vomiting, hypothyroid, headaches, generalized anxiety disorder, and ADHD who presents for headaches.    He has had chronic nausea and episodic vomiting since 24 years old.  He underwent extensive workup, which was unremarkable.  This included EGD, CT abdomen and pelvis, abdominal ultrasound, and 24 hour urine porphyrins.  MRI of brain with and without contrast from 05/24/2016 personally reviewed was normal.  He has had ED visits for his vomiting, as recent as 02/14/2020.  He was previously using marijuana and was advised to stop.  He stated that he stopped use but symptoms did not improve.  However, UDS in the ED was positive for THC.  He reports daily headaches that started 4-5 months ago. It is a moderate to severe pounding right parietal and bilateral retro-orbital pain with associated photophobia and phonophobia but no visual disturbance, numbness or weakness.  Nausea isn't particularly more severe with the headaches.   Ibuprofen or Tylenol would break the headache in 30 minutes but the headache would return in 2 hours and last until he wakes up the next morning.  As analgesics appear not to be effective, he has stopped taking them.  No specific triggers.  Sleep helped.  He was started on nortriptyline for his chronic nausea and vomiting in early February.  He hasn't had a headache in the past 2 days.  He reports prior history of more mild headaches that would occur once every couple of months.  Due to delayed growth and puberty onset, he also had an MRI of brain with attention to the pituitary with and without contrast on 05/30/2013, which was normal.   Current NSAIDS:  none Current analgesics:  none Current  triptans:  none Current ergotamine:  none Current anti-emetic:  Zofran-ODT 8mg  twice daily; promethazine 12.5-25mg  daily at bdtime Current muscle relaxants:  none Current anti-anxiolytic:  none Current sleep aide:  none Current Antihypertensive medications:  none Current Antidepressant medications:  Nortriptyline 20mg  at bedtime Current Anticonvulsant medications:  none Current anti-CGRP:  none Current Vitamins/Herbal/Supplements:  MVI Current Antihistamines/Decongestants:  none Other therapy:  none Hormone/birth control:  none  Past NSAIDS:  Ibuprofen 800mg  Past analgesics:  Tylenol Past abortive triptans:  none Past abortive ergotamine:  none Past muscle relaxants:  none Past anti-emetic:  none Past antihypertensive medications:  none Past antidepressant medications:  escitalopram Past anticonvulsant medications:  carbamazepine Past anti-CGRP:  none Past vitamins/Herbal/Supplements:  none Past antihistamines/decongestants:  Claritin Other past therapies:  none  Caffeine:  No coffee.  Pepsi daily.  Sometimes Mt Dew Diet:  Drinks 32 to 64 oz water daily.  Sometimes skips meals Exercise:  Not routine Depression:  no; Anxiety:  yes Other pain:  no Sleep:  6 to 7 hours uninterrupted sleep.  No daytime fatigue Family history of headache:  Mom (migraines with aura); maternal aunt (migraine with aura); sister    PAST MEDICAL HISTORY: Past Medical History:  Diagnosis Date  . ADHD (attention deficit hyperactivity disorder) 2000  . Anxiety   . Chronic nausea   . Depression   . Headache(784.0)   . Hypothyroidism   . Prediabetes   . Puberty delay     PAST SURGICAL HISTORY: Past Surgical History:  Procedure Laterality Date  . ESOPHAGOGASTRODUODENOSCOPY (EGD)  WITH PROPOFOL N/A 05/25/2016   Procedure: ESOPHAGOGASTRODUODENOSCOPY (EGD) WITH PROPOFOL;  Surgeon: Jerene Bears, MD;  Location: [facility];  Service: Endoscopy;  Laterality: N/A;  . PLANTAR'S WART EXCISION       MEDICATIONS: Current Outpatient Medications on File Prior to Visit  Medication Sig Dispense Refill  . calcium carbonate (OS-CAL) 600 MG TABS tablet Take 600 mg by mouth as needed. Reported on 04/05/2016    . levothyroxine (SYNTHROID) 50 MCG tablet Take 0.5-1 tablets (25-50 mcg total) by mouth See admin instructions. Take 25mg  every day except on sundays take 50mg  90 tablet 0  . Multiple Vitamin (MULTIVITAMIN WITH MINERALS) TABS tablet Take 1 tablet by mouth as needed.     . nortriptyline (PAMELOR) 10 MG capsule Take 1 tablet (10mg ) by mouth every night at bedtime for 2 weeks. Then increase to 2 tablets (20mg ) every night at bedtime (Patient taking differently: Take 20 mg by mouth at bedtime. ) 60 capsule 1  . ondansetron (ZOFRAN-ODT) 8 MG disintegrating tablet Take 1 tablet (8 mg total) by mouth 2 (two) times daily. 180 tablet 1  . promethazine (PHENERGAN) 12.5 MG tablet Take 1 tablet (12.5 mg total) by mouth every 8 (eight) hours as needed for nausea or vomiting. (Patient taking differently: Take 12.5-25 mg by mouth at bedtime. ) 180 tablet 1  . sucralfate (CARAFATE) 1 g tablet Take 1 tablet (1 g total) by mouth 4 (four) times daily -  with meals and at bedtime. 20 tablet 0   No current facility-administered medications on file prior to visit.    ALLERGIES: Allergies  Allergen Reactions  . Reglan [Metoclopramide] Other (See Comments)    Seizure like activity    FAMILY HISTORY: Family History  Problem Relation Age of Onset  . Thyroid disease Mother   . Diabetes Maternal Grandmother        and MGGM  . Heart disease Maternal Grandmother        and MGGM  . Breast cancer Other        and MGGM  . Thyroid disease Other        MGGM  . Colon cancer Neg Hx     SOCIAL HISTORY: Social History   Socioeconomic History  . Marital status: Single    Spouse name: Not on file  . Number of children: 0  . Years of education: Not on file  . Highest education level: Not on file   Occupational History  . Not on file  Tobacco Use  . Smoking status: Never Smoker  . Smokeless tobacco: Never Used  Substance and Sexual Activity  . Alcohol use: No    Alcohol/week: 0.0 standard drinks  . Drug use: No  . Sexual activity: Never    Comment: father smokes  Other Topics Concern  . Not on file  Social History Narrative   Lives at home with mom and 2 cats, visits dad every other weekend, will attend [employer], will start 9th grade in the fall.   Social Determinants of Health   Financial Resource Strain:   . Difficulty of Paying Living Expenses:   Food Insecurity:   . Worried About Charity fundraiser in the Last Year:   . Arboriculturist in the Last Year:   Transportation Needs:   . Film/video editor (Medical):   Marland Kitchen Lack of Transportation (Non-Medical):   Physical Activity:   . Days of Exercise per Week:   . Minutes of Exercise per Session:  Stress:   . Feeling of Stress :   Social Connections:   . Frequency of Communication with Friends and Family:   . Frequency of Social Gatherings with Friends and Family:   . Attends Religious Services:   . Active Member of Clubs or Organizations:   . Attends Banker Meetings:   Marland Kitchen Marital Status:   Intimate Partner Violence:   . Fear of Current or Ex-Partner:   . Emotionally Abused:   Marland Kitchen Physically Abused:   . Sexually Abused:     PHYSICAL EXAM: Blood pressure 131/90, pulse 79, height 5\' 10"  (1.778 m), weight 171 lb (77.6 kg), SpO2 100 %. General: No acute distress.  Patient appears well-groomed.   Head:  Normocephalic/atraumatic Eyes:  fundi examined but not visualized Neck: supple, no paraspinal tenderness, full range of motion Back: No paraspinal tenderness Heart: regular rate and rhythm Lungs: Clear to auscultation bilaterally. Vascular: No carotid bruits. Neurological Exam: Mental status: alert and oriented to person, place, and time, recent and remote memory intact, fund of  knowledge intact, attention and concentration intact, speech fluent and not dysarthric, language intact. Cranial nerves: CN I: not tested CN II: pupils equal, round and reactive to light, visual fields intact CN III, IV, VI:  full range of motion, no nystagmus, no ptosis CN V: facial sensation intact CN VII: upper and lower face symmetric CN VIII: hearing intact CN IX, X: gag intact, uvula midline CN XI: sternocleidomastoid and trapezius muscles intact CN XII: tongue midline Bulk & Tone: normal, no fasciculations. Motor:  5/5 throughout  Sensation:  Pinprick and vibration sensation intact. Deep Tendon Reflexes:  2+ throughout, toes downgoing.   Finger to nose testing:  Without dysmetria.   Heel to shin:  Without dysmetria.   Gait:  Normal station and stride.  Able to turn and tandem walk. Romberg negative.  IMPRESSION: Chronic migraine without aura, without status migrainosus, intractable.  Headache-free for past 2 days.  Possibly due to nortriptyline kicking in.  PLAN: 1.  Continue nortriptyline 20mg  at bedtime.  If headaches increase, we can titrate up. 2.  For abortive therapy, will prescribe him Maxalt 10mg .  Will prescribe him the dissolvable tablet given his chronic nausea. 3.  Follow up in 4 months.   Thank you for allowing me to take part in the care of this patient.  , DO  CC:  , MD  , MD

## 2020-02-20 ENCOUNTER — Other Ambulatory Visit: Payer: Self-pay

## 2020-02-20 ENCOUNTER — Ambulatory Visit (INDEPENDENT_AMBULATORY_CARE_PROVIDER_SITE_OTHER): Payer: Self-pay | Admitting: Neurology

## 2020-02-20 ENCOUNTER — Encounter: Payer: Self-pay | Admitting: Neurology

## 2020-02-20 VITALS — BP 131/90 | HR 79 | Ht 70.0 in | Wt 171.0 lb

## 2020-02-20 DIAGNOSIS — G43019 Migraine without aura, intractable, without status migrainosus: Secondary | ICD-10-CM

## 2020-02-20 DIAGNOSIS — R112 Nausea with vomiting, unspecified: Secondary | ICD-10-CM

## 2020-02-20 MED ORDER — RIZATRIPTAN BENZOATE 10 MG PO TBDP: ORAL_TABLET | ORAL | 3 refills | Status: DC

## 2020-02-20 NOTE — Patient Instructions (Addendum)
1.  Take rizatriptan 10mg  earliest onset of migraine.  May repeat after 2 hours if needed.  Maximum 2 tablets in 24 hours. 2.  Continue nortriptyline 20mg  at bedtime.  There is room to increase dose if needed. 3.  Follow up in 4 months   Migraine Headache A migraine headache is a very strong throbbing pain on one side or both sides of your head. This type of headache can also cause other symptoms. It can last from 4 hours to 3 days. Talk with your doctor about what things may bring on (trigger) this condition. What are the causes? The exact cause of this condition is not known. This condition may be triggered or caused by:  Drinking alcohol.  Smoking.  Taking medicines, such as: ? Medicine used to treat chest pain (nitroglycerin). ? Birth control pills. ? Estrogen. ? Some blood pressure medicines.  Eating or drinking certain products.  Doing physical activity. Other things that may trigger a migraine headache include:  Having a menstrual period.  Pregnancy.  Hunger.  Stress.  Not getting enough sleep or getting too much sleep.  Weather changes.  Tiredness (fatigue). What increases the risk?  Being 36-66 years old.  Being male.  Having a family history of migraine headaches.  Being Caucasian.  Having depression or anxiety.  Being very overweight. What are the signs or symptoms?  A throbbing pain. This pain may: ? Happen in any area of the head, such as on one side or both sides. ? Make it hard to do daily activities. ? Get worse with physical activity. ? Get worse around bright lights or loud noises.  Other symptoms may include: ? Feeling sick to your stomach (nauseous). ? Vomiting. ? Dizziness. ? Being sensitive to bright lights, loud noises, or smells.  Before you get a migraine headache, you may get warning signs (an aura). An aura may include: ? Seeing flashing lights or having blind spots. ? Seeing bright spots, halos, or zigzag  lines. ? Having tunnel vision or blurred vision. ? Having numbness or a tingling feeling. ? Having trouble talking. ? Having weak muscles.  Some people have symptoms after a migraine headache (postdromal phase), such as: ? Tiredness. ? Trouble thinking (concentrating). How is this treated?  Taking medicines that: ? Relieve pain. ? Relieve the feeling of being sick to your stomach. ? Prevent migraine headaches.  Treatment may also include: ? Having acupuncture. ? Avoiding foods that bring on migraine headaches. ? Learning ways to control your body functions (biofeedback). ? Therapy to help you know and deal with negative thoughts (cognitive behavioral therapy). Follow these instructions at home: Medicines  Take over-the-counter and prescription medicines only as told by your doctor.  Ask your doctor if the medicine prescribed to you: ? Requires you to avoid driving or using heavy machinery. ? Can cause trouble pooping (constipation). You may need to take these steps to prevent or treat trouble pooping:  Drink enough fluid to keep your pee (urine) pale yellow.  Take over-the-counter or prescription medicines.  Eat foods that are high in fiber. These include beans, whole grains, and fresh fruits and vegetables.  Limit foods that are high in fat and sugar. These include fried or sweet foods. Lifestyle  Do not drink alcohol.  Do not use any products that contain nicotine or tobacco, such as cigarettes, e-cigarettes, and chewing tobacco. If you need help quitting, ask your doctor.  Get at least 8 hours of sleep every night.  Limit and deal  with stress. General instructions      Keep a journal to find out what may bring on your migraine headaches. For example, write down: ? What you eat and drink. ? How much sleep you get. ? Any change in what you eat or drink. ? Any change in your medicines.  If you have a migraine headache: ? Avoid things that make your symptoms  worse, such as bright lights. ? It may help to lie down in a dark, quiet room. ? Do not drive or use heavy machinery. ? Ask your doctor what activities are safe for you.  Keep all follow-up visits as told by your doctor. This is important. Contact a doctor if:  You get a migraine headache that is different or worse than others you have had.  You have more than 15 headache days in one month. Get help right away if:  Your migraine headache gets very bad.  Your migraine headache lasts longer than 72 hours.  You have a fever.  You have a stiff neck.  You have trouble seeing.  Your muscles feel weak or like you cannot control them.  You start to lose your balance a lot.  You start to have trouble walking.  You pass out (faint).  You have a seizure. Summary  A migraine headache is a very strong throbbing pain on one side or both sides of your head. These headaches can also cause other symptoms.  This condition may be treated with medicines and changes to your lifestyle.  Keep a journal to find out what may bring on your migraine headaches.  Contact a doctor if you get a migraine headache that is different or worse than others you have had.  Contact your doctor if you have more than 15 headache days in a month. This information is not intended to replace advice given to you by your health care provider. Make sure you discuss any questions you have with your health care provider. Document Revised: 02/08/2019 Document Reviewed: 11/29/2018 Elsevier Patient Education  [place].

## 2020-03-02 ENCOUNTER — Other Ambulatory Visit: Payer: Self-pay | Admitting: Gastroenterology

## 2020-05-05 ENCOUNTER — Telehealth (INDEPENDENT_AMBULATORY_CARE_PROVIDER_SITE_OTHER): Payer: Self-pay | Admitting: "Endocrinology

## 2020-05-05 ENCOUNTER — Ambulatory Visit (INDEPENDENT_AMBULATORY_CARE_PROVIDER_SITE_OTHER): Payer: Medicaid Other | Admitting: "Endocrinology

## 2020-05-05 DIAGNOSIS — E063 Autoimmune thyroiditis: Secondary | ICD-10-CM

## 2020-05-05 NOTE — Progress Notes (Deleted)
Subjective:  Patient Name: Paxton Binns Date of Birth: Aug 08, 1996  MRN: [id number]  [name]  presents to the office today for follow up evaluation and management of his puberty delay, obesity, gynecomastia, linear growth delay/short stature, hypothyroid, thyroiditis, goiter, nausea and abdominal pains, and unintentional weight loss.   HISTORY OF PRESENT ILLNESS:   [name] is a 24 y.o. Caucasian young man.   Sonam was accompanied by his mother.  1. Shepherd's initial [facility] pediatric endocrine consultation was on 05/22/13 at age 49:  A. [name]: Term, emergency C-section for failure to progress. Birth weight: 7 lbs-15 oz. Healthy newborn.  B. Infancy: Healthy  C. Childhood: ADHD was diagnosed about age 38. [name] was followed at [facility] and [facility]. Stimulants and non-stimulants did not work.  He also had severe anger issues. He sometimes bit himself or hit things, but had not hurt himself seriously. He was hit in the forehead by a pitched baseball at about age 64-8. There was no loss of consciousness. His only surgery was curettage of plantar warts. He had seasonal allergies, but no medication allergies.    D. Obesity: Mom had been concerned about his excess weight gain for several years. Mom tried to get him to eat healthy. According to mom, dad fed him whatever he wanted. Neither dad nor [name] exercised.   E. Puberty delay: He had no axillary hair, pubic hair, or genital development.  F. Pertinent family history: Strong FH of obesity in mom and  maternal relatives. FH of T2DM in maternal grandmother. No FH of delayed puberty. Hypothyroidism in mom, MGM, [place]. Neither mom nor MGM had thyroid surgery or irradiation. Menopause at age 45 in mom, MGM, and [name]. Mental health issues on dad's side of family. Dad had severe mood swings. Dad was an alcoholic and PGF was an alcoholic. [Addendum 11/18/14: Mom stated that dad also used drugs. Mom and dad divorced when [name] was age 53. Mom  also had depression.] [Addendum 05/11/17: [name] accused his mother of frequently getting drunk at night. Mom said that she sometimes had two glasses of wine at night, but not every night. [name] said that she drank a full bottle of wine each night.]  G. On exam, his BP was elevated at 122/87. He was quite agitated during the visit, supposedly because he was angry that mom was sharing his personal health information with yet another person. He was significantly overweight. He had a 19-20 gram goiter. His breasts were fatty with a Tanner stage 3 appearance and enlarged areolae, but no breast buds. His abdomen was enlarged. His pubic hair was Tanner stage 1. His testes were 2-3 mL in volume, very early pubertal. Lab tests showed normal CMP, TFTs, and TPO antibody levels. His FSH was 2.2, LH 0.3, testosterone 63, and estradiol 15.4, all c/w being in early puberty.  H. I diagnosed pubertal delay, linear growth delay, early gynecomastia, overweight, dyspepsia, goiter, and acanthosis. I instructed [name] and his mother on our Eat Right Diet and proper exercise techniques. I also started him on ranitidine, 150 mg, twice daily.   2. During the past five years [name] has had slow progression of puberty. As he has grown older, and despite stopping his psych medications,  he has also become more emotionally stable and less emotionally reactive. 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 He has had recurrent problems with cyclical vomiting syndrome, nausea, and abdominal pains. In the past two years  he has been progressively losing weight.   3. Malin's last [facility] visit was on 06/05/18. At that visit I continued his Synthroid dose of 37.5 mcg/day on three days each week, but continued the 25 mcg/day for 4 days each week.   A. In the interim he has not had any new health issues or problems.   B. He has not missed many doses of Synthroid.   C. He still gets nausea now and  then, but much less often. His abdominal pain has resolved. He still gets hungry, but not as much as he used to. He saw [name], PA, at [facility] again on 02/07/17. She decreased his omeprazole to 20 mg each morning, but he does not take it very often. He sometimes takes melatonin, calcium, and MVI.  C. He stopped taking Strattera due to "feeling weird". He also stopped Focalin due to tachycardia. Jakavion no longer goes to Dr. [name]. He also stopped carbamazepine due to it making him "ugly".   D.  His separation anxiety is "easing off", but is still an issue. He has not been feeling depressed. He does not like to be in crowds. He pretty much stays at home or goes to his father's home. He spends most of his time playing video games. He stays up much of the night and sleeps in during much of the day.   E. He had psychological testing in the past. According to mom, Amando was found to have "a little bit of a low IQ". ADD was also noted. An individual IEP was developed. He did not finish high school.   F. He eats three times per day. Mom no longer restricts his sugar and starch intake due to fears about him becoming a diabetic.   G. He has had some pain in the area of his left medial meniscus   H. He walks every day for about 30 minutes each time.    3.  Pertinent Review of Systems:  Constitutional: He feels "pretty good".  Eyes: He says that he can see well without his glasses. He had his last eye exam on 06/01/15 and had a new pair of eye glasses, but he refuses to wear those glasses.There are no recognized eye problems. Mom has not yet scheduled him for a follow up eye exam.  Neck: The patient has no complaints of anterior neck swelling, soreness, tenderness, pressure, discomfort, or difficulty swallowing.   Heart: Heart rate increases with exercise or other physical activity. He has no complaints of palpitations, irregular heart beats, chest pain, or chest pressure.   Gastrointestinal: As above.  Sometimes his stomach growls. Bowel movements are good for the most part.  Legs: As above. Muscle mass and strength seem normal. There are no complaints of numbness, tingling, burning, or pain. No edema is noted.  Feet: There are no complaints of numbness, tingling, burning, or pain. No edema is noted. Neurologic: There are no recognized problems with muscle movement and strength, sensation, or coordination. GU: He has more pubic hair and axillary hair. He says that his genitalia are larger. Voice may be a little deeper.   PAST MEDICAL, FAMILY, AND SOCIAL HISTORY  Past Medical History:  Diagnosis Date  . ADHD (attention deficit hyperactivity disorder) 2000  . Anxiety   . Chronic nausea   . Depression   . Headache(784.0)   . Hypothyroidism   . Prediabetes   . Puberty delay     Family History  Problem Relation Age of Onset  .  Thyroid disease Mother   . Diabetes Maternal Grandmother        and MGGM  . Heart disease Maternal Grandmother        and MGGM  . Breast cancer Other        and MGGM  . Thyroid disease Other        MGGM  . Colon cancer Neg Hx      Current Outpatient Medications:  .  calcium carbonate (OS-CAL) 600 MG TABS tablet, Take 600 mg by mouth as needed. Reported on 04/05/2016, Disp: , Rfl:  .  levothyroxine (SYNTHROID) 50 MCG tablet, Take 0.5-1 tablets (25-50 mcg total) by mouth See admin instructions. Take 25mg  every day except on sundays take 50mg , Disp: 90 tablet, Rfl: 0 .  Multiple Vitamin (MULTIVITAMIN WITH MINERALS) TABS tablet, Take 1 tablet by mouth as needed. , Disp: , Rfl:  .  nortriptyline (PAMELOR) 10 MG capsule, Take 2 capsules (20 mg total) by mouth at bedtime., Disp: 60 capsule, Rfl: 3 .  ondansetron (ZOFRAN-ODT) 8 MG disintegrating tablet, Take 1 tablet (8 mg total) by mouth 2 (two) times daily., Disp: 180 tablet, Rfl: 1 .  promethazine (PHENERGAN) 12.5 MG tablet, Take 1 tablet (12.5 mg total) by mouth every 8 (eight) hours as needed for nausea or  vomiting. (Patient taking differently: Take 12.5-25 mg by mouth at bedtime. ), Disp: 180 tablet, Rfl: 1 .  rizatriptan (MAXALT-MLT) 10 MG disintegrating tablet, Take 1 tablet earliest onset of migraine.  May repeat in 2 hours if needed.  Maximum 2 tablets in 24 hours, Disp: 10 tablet, Rfl: 3 .  sucralfate (CARAFATE) 1 g tablet, Take 1 tablet (1 g total) by mouth 4 (four) times daily -  with meals and at bedtime., Disp: 20 tablet, Rfl: 0  Allergies as of 05/05/2020 - Review Complete 02/20/2020  Allergen Reaction Noted  . Reglan [metoclopramide] Other (See Comments) 03/28/2016     reports that he has never smoked. He has never used smokeless tobacco. He reports that he does not drink alcohol and does not use drugs. Pediatric History  Patient Parents  . Tetterton,Angela (Mother)   Other Topics Concern  . Not on file  Social History Narrative   Lives at home with mom and 2 cats, visits dad every other weekend, will attend [employer], will start 9th grade in the fall.    1. School and Family: He is no longer trying to finish high school.  Florence lives with mom, but has been spending 1-2 visits a month with dad. His [place] Medicaid coverage will expire in September on his 22st birthday.   2. Activities: His physical activities have increased.   3. Primary Care Provider: Dr. October in [place], [facility], phone [phone] 4. Psychiatrist: None 5. GI: [facility], PA at [facility]   REVIEW OF SYSTEMS: There are no other significant problems involving Nikitas's other body systems.   Objective:   There were no vitals taken for this visit.   Ht Readings from Last 3 Encounters:  02/20/20 5\' 10"  (1.778 m)  02/14/20 5\' 10"  (1.778 m)  12/06/19 5\' 10"  (1.778 m)   Wt Readings from Last 3 Encounters:  02/20/20 171 lb (77.6 kg)  02/14/20 161 lb 9 oz (73.3 kg)  12/06/19 164 lb 4 oz (74.5 kg)   HC Readings from Last 3 Encounters:  No data found for HC   There is  no height or weight on file to calculate BSA. Facility  age limit for growth percentiles is 20 years. Facility age limit for growth percentiles is 20 years.    PHYSICAL EXAM:  Constitutional:   A. [name] is alert and more interactive today. He is still a bit withdrawn, but engaged much better today. His affect was still fairly flat. His insight was fair. Today was the most interactive and engaging visit that we have ever had. His height has increased a bit since his last visit. His weight has increased 7 pounds.   B. Today's visit was very calm and peaceful. Both Clifton CustardAaron and mom were on their best behaviors. There were no arguments back and forth between [place] and mom.  Head: The head is normocephalic. Face: The face appears normal. There are no obvious dysmorphic features or plethora. Eyes: The eyes appear to be normally formed and spaced. Gaze is conjugate. There is no obvious arcus or proptosis. Eye moisture is normal.  Ears: The ears are normally placed and appear externally normal. Mouth: The oropharynx and tongue appear normal. Dentition appears to be normal for age. Mouth is a bit dry.  There is no hyperpigmentation. He has a grade I fine, blond mustache.  Neck: The neck appears to be visibly normal today. No carotid bruits are noted. His strap muscles are thicker. The thyroid gland is still enlarged at about 21 grams in size. Today the right lobe has shrunk back to normal, but the left lobe is still enlarged. The consistency of the thyroid gland is normal today. The thyroid gland is not  tender to palpation. He has no acanthosis.  Lungs: The lungs are clear to auscultation. Air movement is good. Heart: Heart rate and rhythm are regular. Heart sounds S1 and S2 are normal. I did not appreciate any pathologic cardiac murmurs. Abdomen: The abdomen is normal in size. Bowel sounds are normal. He is not tender to palpation today. Arms: Muscle size and bulk are normal for age. Hands: There is no  obvious tremor. Phalangeal and metacarpophalangeal joints are normal. Palmar muscles are normal for age. Palmar skin is normal. Palmar moisture is also normal. Fingernails are pale.  Legs: Muscles appear normal for age. No edema is present. Neurologic: Strength is normal for age in both the upper and lower extremities. His upper body strength has been gradually increasing over time. Muscle tone is normal. Sensation to touch is normal in both legs.   LAB DATA:   No results found for this or any previous visit (from the past 504 hour(s)).   Labs 02/12/20: CMP normal; CBC normal; Lipase 18 (ref 11-51); Urine drug screen: Positive for THC; urinalysis positive for 20 ketones  Labs 05/31/18: TSH 1.68, free T4 1.1, free T3 4.0; IGF-1 256; testosterone 631  Labs 04/04/18: HbA1c 5.1%, CBG 79; TSH 2.07, free T4 1.1, free T3 4.0; testosterone 605, estradiol 28  08/09/17: TSH 3.18, free T4 1.1, free T3 3.6; C-peptide 2.03 (ref 0.80-3.85); testosterone 276  04/06/17: HbA1c 5.5%; TSH 2.28, free T4 1.2, free T3 3.7; testosterone 230; IGF-1 368, IGFBP-3 6.7 (ref 2.9-7.3)     12/29/16: HbA1c 5.1%; TSH 2.24, free T4 1.59; , free T3 39; IGF-1 262, IGFBP-3 5.9 (ref 2.9-7.3); LH 2.4, FSH 2.2, testosterone 204, estradiol <15   07/11/16: HbA1c 5.4%  05/25/16: TSH 1.561  05/24/16: HbA1c 5.4%, TSH 1.055  05/23/16: As above  05/18/16: LH 0.6, FSH 1.5, testosterone 23; TSH 1.73, free T4 1.0, free T3 2.9; IGF-1 79, IGFBP-3 4.0  04/05/16: Celiac panel and IgA both normal  02/23/16:  HbA1c 5.7%  01/05/16: Urine showed small bilirubin; CMP normal, except for glucose 105 after vomiting; CBC with WBC 11.2;   11/25/15: HbA1c 5.6%  09/26/15: HbA1c 5.8%; IGF-1 110, IGFBP-3 4.0; LH 0.4, FSH 1.9, testosterone 30, free testosterone 3.6, estradiol 14.6; TSH 2.315, TPO antibody <1  06/18/15: Chromosomes 46, XY  06/09/15: HbA1c 5.8%; TSH 1.906, free T4 0.94, free T3 2.8; testosterone 24; IGF-1 110; IGFBP-3 5.0  02/27/15: HbA1c  5.7%; TSH 3.443, free T4 0.74, free T3 3.0; testosterone 29  10/28/14: Testosterone 21, IGF-1 119  08/14/14: TSH 2.835, free T4 0.97, free T3 3.6; LH 0.6, FSH 1.6, testosterone 18, estradiol < 11.8; IGF-1 108, IGFBP-3 3.8  06/18/14: testosterone 27, prolactin 3.8; TSH 3.106, free T4 0.93, free T3 3.0  11/20/13: TSH 0.5, free T4 0.99, free T3 3.4; LH 0.5, FSH 3.1, testosterone 18, estradiol < 11.8  07/15/13: CBC normal; CMP normal;   05/22/13: CMP normal; TSH 2.46, free T4 1.06, free T3 3.3, TPO antibody 11.2; FSH 2.2, LH 0.3, testosterone 63, estradiol 15.6  IMAGING:  Bone age 56/21/17: Bone age was read as 14 years at a chronologic age of 25 years and 7 months. I read the bone age as 10-6.   Bone age 73/23/14: Bone age 22 years at chronologic age 18 years, 10 months.   MRI of head 05/30/13: Normal pituitary gland and brain.   Assessment and Plan:   ASSESSMENT:  1. Puberty delay:   A. Menno's testes have gradually increased in size over time. His most recent testosterone value in August 2019 indicated that he has pretty much completed puberty, but he may have higher testosterone values over time.   2. Growth delay, linear: His height growth has increased in the past year. His epiphyses appear to still be open. He is taking in enough calories to support his linear growth and muscle growth..    3-5: Goiter/thyroiditis/hypothyroid:   A. His thyroid gland is about the same size today, but the lobes have shifted in size. The process of waxing and waning of thyroid gland size is c/w slowly evolving Hashimoto's thyroiditis.  His thyroiditis is clinically quiescent today.   B. His TFTs have varied over time and we have gradually increased his Synthroid doses over time. He is still on  A relatively low dose of Synthroid, which indicates that he has many more thyroid cells to lose.   C. His TFTs in August 2019 were normal on his current doses of Synthroid.  6-7. Overweight/unintentional weight loss:  He is now gaining weight appropriately.  8-10. Nausea/reflux/dyspepsia/abdominal pain:   A. The nausea is "a little better". His reflux has resolved. His abdominal pain has resolved.   Ebbie Ridge and mom intend to return to [facility] for follow up.   11. Hypertension:  . His SBP is normal. His DBP is still elevated. He needs to exercise more.  12. Prediabetes :   A. His HbA1c was 5.7% on 02/27/15 when his BMI was 91.80%, 5.8% on 06/09/15 when his BMI was 87.39%, and 5.8% on 09/22/15 when his BMI was 81.79%. Thereafter his HbA1c values began to decrease. His HbA1c was 5.3% on 05/24/16. Since then his HbA1c values have varied between 5.2-5.5%.   B. His HbA1c had decreased to 5.1% in June 2019. His C-peptide in October 2018 was mid-normal. His C-peptide in August 2019 was lower, but still normal. We need to continue to follow this issue over time. Given the autoimmune nature of his thyroiditis and hypothyroidism, it  is possible that he could be slowly developing T1DM.  13-14: Anxiety/depression: He is doing much better.   PLAN:   1. Diagnostic: I reviewed his June and August lab results and clinical course.   2. Therapeutic: Convert to levothyroxine, 50 mcg tablets, 1/2 tab /day for 6 days per week and 1 tab on Sundays.  3. Patient education: We spent part of the visit discussing his thyroid issues and GI problems. However, we spent most of the visit discussing how to follow his medical issues after he loses his Medicaid coverage next month. I modified his thyroid hormone regimen to try to reduce costs for him.   4. Follow-up: 3 months.   Level of Service: This visit lasted in excess of 55 minutes. More than 50% of the visit was devoted to counseling.  Molli Knock, MD, CDE Adult and Pediatric Endocrinology

## 2020-05-05 NOTE — Telephone Encounter (Signed)
°  Who's calling (name and relationship to patient) : Yoshiaki Kreuser (mom)  Best contact number: [phone]  Provider they see: Dr. Fransico Michael  Reason for call: Mom had to reschedule today's appointment to September. Requesting refill be sent to pharmacy to bridge the gap to the next appointment.    PRESCRIPTION REFILL ONLY  Name of prescription: levothyroxine (SYNTHROID) 50 MCG tablet  Pharmacy: [facility] [id number] [facility], [place] - [address]

## 2020-05-06 MED ORDER — LEVOTHYROXINE SODIUM 50 MCG PO TABS: ORAL_TABLET | ORAL | 0 refills | Status: DC

## 2020-05-06 NOTE — Addendum Note (Signed)
Addended by: Angelene Giovanni A on: 05/06/2020 04:49 PM   Modules accepted: Orders

## 2020-05-06 NOTE — Telephone Encounter (Signed)
Attempted to call mom that Dr. Fransico Michael stated to send in a 90 day supply of medication and inform them that no more refills can be sent until he is seen in the office.  Refill has been sent to the pharmacy with a note for pharmacist with the same information as above.

## 2020-05-06 NOTE — Telephone Encounter (Signed)
Called mom to inform her that we can not send in a prescription until the patient is seen. He has not been seen since 2019. We can not legally send in a prescription.

## 2020-05-07 NOTE — Telephone Encounter (Signed)
Attempted to reach mom, left HIPAA approved voicemail for return phone call.  

## 2020-05-08 NOTE — Telephone Encounter (Signed)
Attempted to reach family to notify them of the script refill and that they must be seen before any more refills can be sent, left HIPAA approved voicemail for return phone call.

## 2020-07-02 ENCOUNTER — Ambulatory Visit (INDEPENDENT_AMBULATORY_CARE_PROVIDER_SITE_OTHER): Payer: Self-pay | Admitting: "Endocrinology

## 2020-07-02 ENCOUNTER — Other Ambulatory Visit: Payer: Self-pay

## 2020-07-02 ENCOUNTER — Encounter (INDEPENDENT_AMBULATORY_CARE_PROVIDER_SITE_OTHER): Payer: Self-pay | Admitting: "Endocrinology

## 2020-07-02 VITALS — BP 118/76 | HR 72 | Ht 69.57 in | Wt 179.4 lb

## 2020-07-02 DIAGNOSIS — E049 Nontoxic goiter, unspecified: Secondary | ICD-10-CM

## 2020-07-02 DIAGNOSIS — R7309 Other abnormal glucose: Secondary | ICD-10-CM

## 2020-07-02 DIAGNOSIS — R110 Nausea: Secondary | ICD-10-CM

## 2020-07-02 DIAGNOSIS — E063 Autoimmune thyroiditis: Secondary | ICD-10-CM

## 2020-07-02 DIAGNOSIS — R7303 Prediabetes: Secondary | ICD-10-CM

## 2020-07-02 DIAGNOSIS — I10 Essential (primary) hypertension: Secondary | ICD-10-CM

## 2020-07-02 LAB — POCT GLUCOSE (DEVICE FOR HOME USE): Glucose Fasting, POC: 102 mg/dL — AB (ref 70–99)

## 2020-07-02 LAB — POCT GLYCOSYLATED HEMOGLOBIN (HGB A1C): Hemoglobin A1C: 5.4 % (ref 4.0–5.6)

## 2020-07-02 NOTE — Progress Notes (Signed)
Subjective:  Patient Name: Charles Beard Date of Birth: Aug 08, 1996  MRN: [id number]  Charles Beard  presents to the office today for follow up evaluation and management of his puberty delay, obesity, gynecomastia, linear growth delay/short stature, hypothyroid, thyroiditis, goiter, nausea and abdominal pains, and unintentional weight loss.   HISTORY OF PRESENT ILLNESS:   Charles Beard is a 24 y.o. Caucasian young man.   Charles Beard was accompanied by his mother.  1. Charles Beard's initial [facility] pediatric endocrine consultation was on 05/22/13 at age 49:  A. [name]: Term, emergency C-section for failure to progress. Birth weight: 7 lbs-15 oz. Healthy newborn.  B. Infancy: Healthy  C. Childhood: ADHD was diagnosed about age 38. Charles Beard was followed at [facility] and [facility]. Stimulants and non-stimulants did not work.  He also had severe anger issues. He sometimes bit himself or hit things, but had not hurt himself seriously. He was hit in the forehead by a pitched baseball at about age 64-8. There was no loss of consciousness. His only surgery was curettage of plantar warts. He had seasonal allergies, but no medication allergies.    D. Obesity: Mom had been concerned about his excess weight gain for several years. Mom tried to get him to eat healthy. According to mom, dad fed him whatever he wanted. Neither dad nor Charles Beard exercised.   E. Puberty delay: He had no axillary hair, pubic hair, or genital development.  F. Pertinent family history: Strong FH of obesity in mom and  maternal relatives. FH of T2DM in maternal grandmother. No FH of delayed puberty. Hypothyroidism in mom, MGM, [place]. Neither mom nor MGM had thyroid surgery or irradiation. Menopause at age 45 in mom, MGM, and Charles Beard. Mental health issues on dad's side of family. Dad had severe mood swings. Dad was an alcoholic and PGF was an alcoholic. [Addendum 11/18/14: Mom stated that dad also used drugs. Mom and dad divorced when Charles Beard was age 53. Mom  also had depression.] [Addendum 05/11/17: Charles Beard accused his mother of frequently getting drunk at night. Mom said that she sometimes had two glasses of wine at night, but not every night. Charles Beard said that she drank a full bottle of wine each night.]  G. On exam, his BP was elevated at 122/87. He was quite agitated during the visit, supposedly because he was angry that mom was sharing his personal health information with yet another person. He was significantly overweight. He had a 19-20 gram goiter. His breasts were fatty with a Tanner stage 3 appearance and enlarged areolae, but no breast buds. His abdomen was enlarged. His pubic hair was Tanner stage 1. His testes were 2-3 mL in volume, very early pubertal. Lab tests showed normal CMP, TFTs, and TPO antibody levels. His FSH was 2.2, LH 0.3, testosterone 63, and estradiol 15.4, all c/w being in early puberty.  H. I diagnosed pubertal delay, linear growth delay, early gynecomastia, overweight, dyspepsia, goiter, and acanthosis. I instructed Charles Beard and his mother on our Eat Right Diet and proper exercise techniques. I also started him on ranitidine, 150 mg, twice daily.   2. During the past five years Charles Beard has had slow progression of puberty. As he has grown older, and despite stopping his psych medications,  he has also become more emotionally stable and less emotionally reactive. 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 He has had recurrent problems with cyclical vomiting syndrome, nausea, and abdominal pains. In the past two years  he has been progressively losing weight.   3. Charles Beard's last [facility] visit was on 06/05/18. At that visit I converted him to levothyroxine, 50 mcg tablets, one tablet per day for one day each week and 1/2 tablet per day for six days each week. He was supposed to return to see me in three months, but did not.    A. In the interim he has not had any new health issues or problems.    B. He has gone through puberty, has a deeper voice, and his genitalia are larger.  C. He no longer has any health insurance. Mother is tying to obtain coverage through [employer]. She only wants him to see [facility] doctors and have lab tests done in [place] labs.   D. He still takes the levothyroxine as last prescribed, but mother says he frequently misses doses.    E. He still gets nausea that is severe. He is seeing Dr. Havery Beard at [place] now.   F. He has no longer being followed for depression and anxiety. He is not taking any medications at this time, but mother feels that he needs such medications.   G.  His separation anxiety is still an issue. He is uncomfortable when mom is away.  He says that he has not been feeling depressed. He does not like to be in crowds. He pretty much stays at home or goes to his father's home.   H. He had psychological testing in the past. According to mom, Charles Beard was found to have "a little bit of a low IQ". ADD was also noted. An individual IEP was developed. He did not finish high school.   I. He eats about three times per day. Mom says he eats way to many carbs, sugar, and fat.    J. He walks every day for about 20-30 minutes each time.    3.  Pertinent Review of Systems:  Constitutional: He feels "alright".  Eyes: He says that he can see well without his glasses. He had his last eye exam on 06/01/15 and had a new pair of eye glasses, but he refuses to wear those glasses.There are no other recognized eye problems. Mom has not yet scheduled him for a follow up eye exam.  Neck: The patient has no complaints of anterior neck swelling, soreness, tenderness, pressure, discomfort, or difficulty swallowing.   Heart: Heart rate increases with exercise or other physical activity. He has no complaints of palpitations, irregular heart beats, chest pain, or chest pressure.   Gastrointestinal: As above. Sometimes his stomach growls. Bowel movements are good for  the most part.  Hands: No tremor; Can text and play video games well  Legs: As above. Muscle mass and strength seem normal. There are no complaints of numbness, tingling, burning, or pain. No edema is noted.  Feet: There are no complaints of numbness, tingling, burning, or pain. No edema is noted. Neurologic: There are no recognized problems with muscle movement and strength, sensation, or coordination. GU: He has much more pubic hair and axillary hair. He says that his genitalia are larger. Voice is much deeper.   PAST MEDICAL, FAMILY, AND SOCIAL HISTORY  Past Medical History:  Diagnosis Date  . ADHD (attention deficit hyperactivity disorder) 2000  . Anxiety   . Chronic nausea   . Depression   . Headache(784.0)   . Hypothyroidism   . Prediabetes   . Puberty delay     Family History  Problem Relation Age of Onset  .  Thyroid disease Mother   . Diabetes Maternal Grandmother        and MGGM  . Heart disease Maternal Grandmother        and MGGM  . Breast cancer Other        and MGGM  . Thyroid disease Other        MGGM  . Colon cancer Neg Hx      Current Outpatient Medications:  .  levothyroxine (SYNTHROID) 50 MCG tablet, Take $RemoveBefo'25mg'SbJMhkdmiWF$  every day except on sundays take $RemoveBefo'50mg'eOeefjDgnKt$ , Disp: 48 tablet, Rfl: 0 .  ondansetron (ZOFRAN-ODT) 8 MG disintegrating tablet, Take 1 tablet (8 mg total) by mouth 2 (two) times daily., Disp: 180 tablet, Rfl: 1 .  rizatriptan (MAXALT-MLT) 10 MG disintegrating tablet, Take 1 tablet earliest onset of migraine.  May repeat in 2 hours if needed.  Maximum 2 tablets in 24 hours, Disp: 10 tablet, Rfl: 3 .  calcium carbonate (OS-CAL) 600 MG TABS tablet, Take 600 mg by mouth as needed. Reported on 04/05/2016 (Patient not taking: Reported on 07/02/2020), Disp: , Rfl:  .  Multiple Vitamin (MULTIVITAMIN WITH MINERALS) TABS tablet, Take 1 tablet by mouth as needed.  (Patient not taking: Reported on 07/02/2020), Disp: , Rfl:  .  nortriptyline (PAMELOR) 10 MG capsule, Take 2  capsules (20 mg total) by mouth at bedtime. (Patient not taking: Reported on 07/02/2020), Disp: 60 capsule, Rfl: 3 .  promethazine (PHENERGAN) 12.5 MG tablet, Take 1 tablet (12.5 mg total) by mouth every 8 (eight) hours as needed for nausea or vomiting. (Patient not taking: Reported on 07/02/2020), Disp: 180 tablet, Rfl: 1 .  sucralfate (CARAFATE) 1 g tablet, Take 1 tablet (1 g total) by mouth 4 (four) times daily -  with meals and at bedtime. (Patient not taking: Reported on 07/02/2020), Disp: 20 tablet, Rfl: 0  Allergies as of 07/02/2020 - Review Complete 07/02/2020  Allergen Reaction Noted  . Reglan [metoclopramide] Other (See Comments) 03/28/2016     reports that he has never smoked. He has never used smokeless tobacco. He reports that he does not drink alcohol and does not use drugs. Pediatric History  Patient Parents  . Vitrano,Angela (Mother)   Other Topics Concern  . Not on file  Social History Narrative   Lives at home with mom and 2 cats, visits dad every other weekend, will attend [employer], will start 9th grade in the fall.    1. School and Family: He is no longer trying to finish high school.  Jaelin lives with mom, but visits dad most nights.    2. Activities: His physical activities have increased.   3. Primary Care Provider: Dr. Cecille Amsterdam in [place], [facility], phone [phone] 4. Psychiatrist: None 5. GI: Dr. Havery Beard at [facility]  6. Neurology: Dr. Tomi Likens at [place]: There are no other significant problems involving Demetric's other body systems.   Objective:   BP 118/76   Pulse 72   Ht 5' 9.57" (1.767 m)   Wt 179 lb 6.4 oz (81.4 kg)   BMI 26.06 kg/m    Ht Readings from Last 3 Encounters:  07/02/20 5' 9.57" (1.767 m)  02/20/20 $RemoveB'5\' 10"'jfjTxJMW$  (1.778 m)  02/14/20 $RemoveB'5\' 10"'quvTaATA$  (1.778 m)   Wt Readings from Last 3 Encounters:  07/02/20 179 lb 6.4 oz (81.4 kg)  02/20/20 171 lb (77.6 kg)  02/14/20 161 lb 9 oz (73.3 kg)   HC  Readings from Last 3 Encounters:  No data found  for [facility]   Body surface area is 2 meters squared. Facility age limit for growth percentiles is 20 years. Facility age limit for growth percentiles is 20 years.    PHYSICAL EXAM:  Constitutional:   A. Delvon's weight has increased 42 pounds since his last visit to 179 pounds. However, his Ideal Body Weight (IBW) is 166 pounds, so he is within 10% of his IBW.  Savir is alert and more interactive today. He is still a bit withdrawn, but engaged better today. His affect was still fairly flat. His insight was fair.  B. Mother is still very negative about his habits. Head: The head is normocephalic. Face: The face appears normal. There are no obvious dysmorphic features or plethora. Eyes: The eyes appear to be normally formed and spaced. Gaze is conjugate. There is no obvious arcus or proptosis. Eye moisture is normal.  Ears: The ears are normally placed and appear externally normal. Mouth: The oropharynx and tongue appear normal. Dentition appears to be normal for age. Oral moisture is normal. There is no hyperpigmentation. He has a grade I fine, blond mustache.  Neck: The neck appears to be visibly normal today. No carotid bruits are noted. His strap muscles are less prominent. The thyroid gland is still enlarged at about 21 grams in size. Today the right lobe is only minimally enlarged, but the left lobe is larger. The consistency of the thyroid gland is normal today. The thyroid gland is not  tender to palpation. He has no acanthosis.  Lungs: The lungs are clear to auscultation. Air movement is good. Heart: Heart rate and rhythm are regular. Heart sounds S1 and S2 are normal. I did not appreciate any pathologic cardiac murmurs. Abdomen: The abdomen is normal in size. Bowel sounds are normal. He is not tender to palpation today. Arms: Muscle size and bulk are normal for age. Hands: There is no obvious tremor. Phalangeal and metacarpophalangeal joints  are normal. Palmar muscles are normal for age. Palmar skin is normal. Palmar moisture is also normal. Fingernails are pale.  Legs: Muscles appear normal for age. No edema is present. Neurologic: Strength is normal for age in both the upper and lower extremities. His upper body strength has been gradually increasing over time. Muscle tone is normal. Sensation to touch is normal in both legs.   LAB DATA:   Results for orders placed or performed in visit on 07/02/20 (from the past 504 hour(s))  POCT Glucose (Device for Home Use)   Collection Time: 07/02/20  2:21 PM  Result Value Ref Range   Glucose Fasting, POC 102 (A) 70 - 99 mg/dL   POC Glucose    POCT glycosylated hemoglobin (Hb A1C)   Collection Time: 07/02/20  2:26 PM  Result Value Ref Range   Hemoglobin A1C 5.4 4.0 - 5.6 %   HbA1c POC (<> result, manual entry)     HbA1c, POC (prediabetic range)     HbA1c, POC (controlled diabetic range)      Labs 07/02/20: HbA1c 5.4%, CBG 102  Labs 02/14/20: CMP normal; CBC normal  Labs 12/06/19: HbA1c 5.7; TSH 3.95  Labs 07/17/18: TSH 2.27, free T4 1.3, free T4 4.3; CMP normal, except alk phos 128 (ref 39-117, but normal in puberty); CBC normal  Labs 05/31/18: TSH 1.68, free T4 1.1, free T3 4.0; IGF-1 256; testosterone 631  Labs 04/04/18: HbA1c 5.1%, CBG 79; TSH 2.07, free T4 1.1, free T3 4.0; testosterone 605, estradiol 28  08/09/17: TSH 3.18, free T4 1.1, free  T3 3.6; C-peptide 2.03 (ref 0.80-3.85); testosterone 276  04/06/17: HbA1c 5.5%; TSH 2.28, free T4 1.2, free T3 3.7; testosterone 230; IGF-1 368, IGFBP-3 6.7 (ref 2.9-7.3)     12/29/16: HbA1c 5.1%; TSH 2.24, free T4 1.59; , free T3 39; IGF-1 262, IGFBP-3 5.9 (ref 2.9-7.3); LH 2.4, FSH 2.2, testosterone 204, estradiol <15   07/11/16: HbA1c 5.4%  05/25/16: TSH 1.561  05/24/16: HbA1c 5.4%, TSH 1.055  05/23/16: As above  05/18/16: LH 0.6, FSH 1.5, testosterone 23; TSH 1.73, free T4 1.0, free T3 2.9; IGF-1 79, IGFBP-3 4.0  04/05/16: Celiac  panel and IgA both normal  02/23/16: HbA1c 5.7%  01/05/16: Urine showed small bilirubin; CMP normal, except for glucose 105 after vomiting; CBC with WBC 11.2;   11/25/15: HbA1c 5.6%  09/26/15: HbA1c 5.8%; IGF-1 110, IGFBP-3 4.0; LH 0.4, FSH 1.9, testosterone 30, free testosterone 3.6, estradiol 14.6; TSH 2.315, TPO antibody <1  06/18/15: Chromosomes 46, XY  06/09/15: HbA1c 5.8%; TSH 1.906, free T4 0.94, free T3 2.8; testosterone 24; IGF-1 110; IGFBP-3 5.0  02/27/15: HbA1c 5.7%; TSH 3.443, free T4 0.74, free T3 3.0; testosterone 29  10/28/14: Testosterone 21, IGF-1 119  08/14/14: TSH 2.835, free T4 0.97, free T3 3.6; LH 0.6, FSH 1.6, testosterone 18, estradiol < 11.8; IGF-1 108, IGFBP-3 3.8  06/18/14: testosterone 27, prolactin 3.8; TSH 3.106, free T4 0.93, free T3 3.0  11/20/13: TSH 0.5, free T4 0.99, free T3 3.4; LH 0.5, FSH 3.1, testosterone 18, estradiol < 11.8  07/15/13: CBC normal; CMP normal;   05/22/13: CMP normal; TSH 2.46, free T4 1.06, free T3 3.3, TPO antibody 11.2; FSH 2.2, LH 0.3, testosterone 63, estradiol 15.6  IMAGING:  Bone age 60/21/17: Bone age was read as 56 years at a chronologic age of 83 years and 7 months. I read the bone age as 69-6.   Bone age 61/23/14: Bone age 60 years at chronologic age 89 years, 33 months.   MRI of head 05/30/13: Normal pituitary gland and brain.   Assessment and Plan:   ASSESSMENT:  1. Puberty delay:   A. Miciah's testes had gradually increased in size over time. His most recent testosterone value in August 2019 indicated that he had pretty much completed puberty, but he may have higher testosterone values over time.   B. His voice is very deep, c/w the completion of puberty.  2. Growth delay, linear: His height growth has ceased in association with moving through puberty. 3-5: Goiter/thyroiditis/hypothyroid:   A. His thyroid gland is about the same size today, but the lobes have shifted in size. The process of waxing and waning of  thyroid gland size is c/w slowly evolving Hashimoto's thyroiditis.  His thyroiditis is clinically quiescent today.   B. His TFTs have varied over time and we have gradually increased his Synthroid doses over time. He is still on a relatively low dose of Synthroid, which indicates that he has many more thyroid cells to lose.   C. His TFTs in August 2019 were normal on his current doses of Synthroid. He appears to be clinically euthyroid or mildly hypothyroid today. 6-7. Overweight/unintentional weight loss: He is now gaining weight appropriately.  8-10. Nausea/reflux/dyspepsia/abdominal pain:   A. The nausea is "a little better". His reflux has resolved. His abdominal pain has resolved.   Corena Pilgrim and mom intend to return to [facility] for follow up.   11. Hypertension: His SBP is normal. His DBP is still elevated. He needs to exercise more.  12. Prediabetes :  A. His HbA1c was 5.7% on 02/27/15 when his BMI was 91.80%, 5.8% on 06/09/15 when his BMI was 87.39%, and 5.8% on 09/22/15 when his BMI was 81.79%. Thereafter his HbA1c values began to decrease. His HbA1c was 5.3% on 05/24/16. Since then his HbA1c values have varied between 5.2-5.7%. His HbA1c was 5.4% in August 2021.  B. His C-peptide in October 2018 was mid-normal. His C-peptide in August 2019 was lower, but still normal. We need to continue to follow this issue over time. Given the autoimmune nature of his thyroiditis and hypothyroidism, it is possible that he could be slowly developing T1DM.  13-14: Anxiety/depression: He is doing somewhat better.   PLAN:   1. Diagnostic: I reviewed his previous and recent lab results and clinical course.   2. Therapeutic: Continue levothyroxine, 50 mcg tablets, 1/2 tab /day for 6 days per week and 1 tab on Sundays. Mom will call us when/if he has insurance coverage.  3. Patient education: We spent part of the visit discussing his thyroid issues and GI problems. I re-issued them the Eat Right Diet plan.  4.  Follow-up: 6 months.   Level of Service: This visit lasted in excess of 65 minutes. More than 50% of the visit was devoted to counseling.  Tillman Sers, MD, CDE Adult and Pediatric Endocrinology

## 2020-07-02 NOTE — Progress Notes (Deleted)
NEUROLOGY FOLLOW UP OFFICE NOTE  Charles Beard [id number]  HISTORY OF PRESENT ILLNESS: Charles Beard is a 24 year old male with chronic nausea and vomiting, hypothyroid, headaches, generalized anxiety disorder, and ADHD who follows up for migraines.  UPDATE: Intensity:  *** Duration:  *** Frequency:  *** Frequency of abortive medication: *** Current NSAIDS:  none Current analgesics:  none Current triptans:  Maxalt 10mg  Current ergotamine:  none Current anti-emetic:  Zofran-ODT 8mg  twice daily; promethazine 12.5-25mg  daily at bdtime Current muscle relaxants:  none Current anti-anxiolytic:  none Current sleep aide:  none Current Antihypertensive medications:  none Current Antidepressant medications:  Nortriptyline 20mg  at bedtime Current Anticonvulsant medications:  none Current anti-CGRP:  none Current Vitamins/Herbal/Supplements:  MVI Current Antihistamines/Decongestants:  none Other therapy:  none Hormone/birth control:  none  Caffeine:  No coffee.  Pepsi daily.  Sometimes Mt Dew Diet:  Drinks 32 to 64 oz water daily.  Sometimes skips meals Exercise:  Not routine Depression:  no; Anxiety:  yes Other pain:  no Sleep:  6 to 7 hours uninterrupted sleep.  No daytime fatigue  HISTORY: He has had chronic nausea and episodic vomiting since 24 years old.  He underwent extensive workup, which was unremarkable.  This included EGD, CT abdomen and pelvis, abdominal ultrasound, and 24 hour urine porphyrins.  MRI of brain with and without contrast from 05/24/2016 personally reviewed was normal.  He has had ED visits for his vomiting, as recent as 02/14/2020.  He was previously using marijuana and was advised to stop.  He stated that he stopped use but symptoms did not improve.  However, UDS in the ED was positive for THC.  He reports daily headaches that started 4-5 months ago. It is a moderate to severe pounding right parietal and bilateral retro-orbital pain with associated photophobia  and phonophobia but no visual disturbance, numbness or weakness.  Nausea isn't particularly more severe with the headaches.   Ibuprofen or Tylenol would break the headache in 30 minutes but the headache would return in 2 hours and last until he wakes up the next morning.  As analgesics appear not to be effective, he has stopped taking them.  No specific triggers.  Sleep helped.  He was started on nortriptyline for his chronic nausea and vomiting in early February.  He hasn't had a headache in the past 2 days.  He reports prior history of more mild headaches that would occur once every couple of months.  Due to delayed growth and puberty onset, he also had an MRI of brain with attention to the pituitary with and without contrast on 05/30/2013, which was normal.   Past NSAIDS:  Ibuprofen 800mg  Past analgesics:  Tylenol Past abortive triptans:  none Past abortive ergotamine:  none Past muscle relaxants:  none Past anti-emetic:  none Past antihypertensive medications:  none Past antidepressant medications:  escitalopram Past anticonvulsant medications:  carbamazepine Past anti-CGRP:  none Past vitamins/Herbal/Supplements:  none Past antihistamines/decongestants:  Claritin Other past therapies:  none   Family history of headache:  Mom (migraines with aura); maternal aunt (migraine with aura); sister   PAST MEDICAL HISTORY: Past Medical History:  Diagnosis Date  . ADHD (attention deficit hyperactivity disorder) 2000  . Anxiety   . Chronic nausea   . Depression   . Headache(784.0)   . Hypothyroidism   . Prediabetes   . Puberty delay     MEDICATIONS: Current Outpatient Medications on File Prior to Visit  Medication Sig Dispense Refill  .  calcium carbonate (OS-CAL) 600 MG TABS tablet Take 600 mg by mouth as needed. Reported on 04/05/2016 (Patient not taking: Reported on 07/02/2020)    . levothyroxine (SYNTHROID) 50 MCG tablet Take 25mg  every day except on sundays take 50mg  48 tablet 0  .  Multiple Vitamin (MULTIVITAMIN WITH MINERALS) TABS tablet Take 1 tablet by mouth as needed.  (Patient not taking: Reported on 07/02/2020)    . nortriptyline (PAMELOR) 10 MG capsule Take 2 capsules (20 mg total) by mouth at bedtime. (Patient not taking: Reported on 07/02/2020) 60 capsule 3  . ondansetron (ZOFRAN-ODT) 8 MG disintegrating tablet Take 1 tablet (8 mg total) by mouth 2 (two) times daily. 180 tablet 1  . promethazine (PHENERGAN) 12.5 MG tablet Take 1 tablet (12.5 mg total) by mouth every 8 (eight) hours as needed for nausea or vomiting. (Patient not taking: Reported on 07/02/2020) 180 tablet 1  . rizatriptan (MAXALT-MLT) 10 MG disintegrating tablet Take 1 tablet earliest onset of migraine.  May repeat in 2 hours if needed.  Maximum 2 tablets in 24 hours 10 tablet 3  . sucralfate (CARAFATE) 1 g tablet Take 1 tablet (1 g total) by mouth 4 (four) times daily -  with meals and at bedtime. (Patient not taking: Reported on 07/02/2020) 20 tablet 0   No current facility-administered medications on file prior to visit.    ALLERGIES: Allergies  Allergen Reactions  . Reglan [Metoclopramide] Other (See Comments)    Seizure like activity    FAMILY HISTORY: Family History  Problem Relation Age of Onset  . Thyroid disease Mother   . Diabetes Maternal Grandmother        and MGGM  . Heart disease Maternal Grandmother        and MGGM  . Breast cancer Other        and MGGM  . Thyroid disease Other        MGGM  . Colon cancer Neg Hx     SOCIAL HISTORY: Social History   Socioeconomic History  . Marital status: Single    Spouse name: Not on file  . Number of children: 0  . Years of education: Not on file  . Highest education level: Not on file  Occupational History  . Not on file  Tobacco Use  . Smoking status: Never Smoker  . Smokeless tobacco: Never Used  Vaping Use  . Vaping Use: Never used  Substance and Sexual Activity  . Alcohol use: No    Alcohol/week: 0.0 standard drinks  .  Drug use: No  . Sexual activity: Never    Comment: father smokes  Other Topics Concern  . Not on file  Social History Narrative   Lives at home with mom and 2 cats, visits dad every other weekend, will attend [employer], will start 9th grade in the fall.   Social Determinants of Health   Financial Resource Strain:   . Difficulty of Paying Living Expenses: Not on file  Food Insecurity:   . Worried About 09/01/2020 in the Last Year: Not on file  . Ran Out of Food in the Last Year: Not on file  Transportation Needs:   . Lack of Transportation (Medical): Not on file  . Lack of Transportation (Non-Medical): Not on file  Physical Activity:   . Days of Exercise per Week: Not on file  . Minutes of Exercise per Session: Not on file  Stress:   . Feeling of Stress : Not on file  Social Connections:   . Frequency of Communication with Friends and Family: Not on file  . Frequency of Social Gatherings with Friends and Family: Not on file  . Attends Religious Services: Not on file  . Active Member of Clubs or Organizations: Not on file  . Attends Banker Meetings: Not on file  . Marital Status: Not on file  Intimate Partner Violence:   . Fear of Current or Ex-Partner: Not on file  . Emotionally Abused: Not on file  . Physically Abused: Not on file  . Sexually Abused: Not on file    PHYSICAL EXAM: *** General: No acute distress.  Patient appears well-groomed.   Head:  Normocephalic/atraumatic Eyes:  Fundi examined but not visualized Neck: supple, no paraspinal tenderness, full range of motion Heart:  Regular rate and rhythm Lungs:  Clear to auscultation bilaterally Back: No paraspinal tenderness Neurological Exam: alert and oriented to person, place, and time. Attention span and concentration intact, recent and remote memory intact, fund of knowledge intact.  Speech fluent and not dysarthric, language intact.  CN II-XII intact. Bulk and tone normal,  muscle strength 5/5 throughout.  Sensation to light touch, temperature and vibration intact.  Deep tendon reflexes 2+ throughout, toes downgoing.  Finger to nose and heel to shin testing intact.  Gait normal, Romberg negative.  IMPRESSION: Migraine without aura, without status migrainosus, not intractable  PLAN: 1.  For preventative management, *** 2.  For abortive therapy, *** 3.  Limit use of pain relievers to no more than 2 days out of week to prevent risk of rebound or medication-overuse headache. 4.  Keep headache diary 5.  Exercise, hydration, caffeine cessation, sleep hygiene, monitor for and avoid triggers 6. Follow up ***   Shon Millet, DO  CC: ***

## 2020-07-02 NOTE — Patient Instructions (Signed)
Follow up visit in 6 months. 

## 2020-07-03 ENCOUNTER — Ambulatory Visit: Payer: Medicaid Other | Admitting: Neurology

## 2020-08-25 ENCOUNTER — Ambulatory Visit: Payer: Medicaid Other | Admitting: Gastroenterology

## 2020-09-21 ENCOUNTER — Telehealth (INDEPENDENT_AMBULATORY_CARE_PROVIDER_SITE_OTHER): Payer: Self-pay | Admitting: "Endocrinology

## 2020-09-21 NOTE — Telephone Encounter (Signed)
°  Who's calling (name and relationship to patient) : Charles Beard (mom)  Best contact number: [phone]  Provider they see: Dr. Fransico Michael  Reason for call: Mom is calling to let Dr. Fransico Michael know that her son did qualify for financial assistance through [employer] and she wanted to make sure that he was aware that he could pass the information along to any other patients who might also need assistance.    PRESCRIPTION REFILL ONLY  Name of prescription:  Pharmacy:

## 2020-10-08 ENCOUNTER — Encounter: Payer: Self-pay | Admitting: Physician Assistant

## 2020-10-08 ENCOUNTER — Ambulatory Visit (INDEPENDENT_AMBULATORY_CARE_PROVIDER_SITE_OTHER): Payer: Self-pay | Admitting: Physician Assistant

## 2020-10-08 VITALS — BP 128/66 | HR 80 | Ht 69.25 in | Wt 191.4 lb

## 2020-10-08 DIAGNOSIS — R110 Nausea: Secondary | ICD-10-CM

## 2020-10-08 MED ORDER — ONDANSETRON 8 MG PO TBDP: 8.0000 mg | ORAL_TABLET | Freq: Two times a day (BID) | ORAL | 3 refills | Status: DC

## 2020-10-08 NOTE — Progress Notes (Signed)
Agree with assessment and plan as outlined.  

## 2020-10-08 NOTE — Progress Notes (Signed)
Chief Complaint: Chronic nausea  HPI:    Charles Beard is a 24 year old Caucasian male with a past medical history as listed below including anxiety and longstanding chronic nausea with intermittent vomiting, known to Dr. Adela Lank, who returns to clinic today for follow-up of his chronic nausea.    12/06/2019 patient seen by Dr. Adela Lank in clinic.  Please see his extensive note at that time.  Patient has had extensive evaluation including CT scan of the abdomen pelvis, ultrasound of the abdomen, EGD (normal except small HH), gastric emptying study, MRI of the brain and previously had a 24-hour urine porphyrins which was normal.  Also had history of marijuana use which she stopped 2 years ago and it has not made any difference to his symptoms.  Also has several other comorbidities including multiple diagnoses of endocrine disorders for which she follows with an endocrinologist in a neuropsychologist for generalized anxiety Dort disorder and ADHD.  When he was seen last he was continuing to feel poorly.  He was given a trial of nortriptyline 10 mg nightly for 2 weeks with plans to increase to 20 mg if tolerated.  He had labs at that time including LFTs with GGT, CBC, TSH and hemoglobin A1c.  These were all normal.    Today, the patient presents to clinic accompanied by his mother who does assist with his history.  He continues with nausea though he thinks it may be "slightly better" than it was before.  Certainly it is tolerable per his perspective, though his mother thinks otherwise.  He explains that when he wakes up typically he is nauseous in the morning and he will uses Zofran 8 mg, about 6 hours later he will often require another Zofran but he tells me now that his nausea typically goes away by early afternoon and oftentimes does not bother him after eating dinner before going to bed.  He is happy with this regimen.  Tells me that he tried the Nortriptyline 10 mg nightly for a couple of weeks and  increase to 20 mg but this made him too sleepy.  (His mother has not sure this is how it went down)    Denies fever, chills, change in bowel habits or weight loss.  Past Medical History:  Diagnosis Date  . ADHD (attention deficit hyperactivity disorder) 2000  . Anxiety   . Chronic nausea   . Depression   . Headache(784.0)   . Hypothyroidism   . Prediabetes   . Puberty delay     Past Surgical History:  Procedure Laterality Date  . ESOPHAGOGASTRODUODENOSCOPY (EGD) WITH PROPOFOL N/A 05/25/2016   Procedure: ESOPHAGOGASTRODUODENOSCOPY (EGD) WITH PROPOFOL;  Surgeon: Beverley Fiedler, MD;  Location: [facility];  Service: Endoscopy;  Laterality: N/A;  . PLANTAR'S WART EXCISION      Current Outpatient Medications  Medication Sig Dispense Refill  . levothyroxine (SYNTHROID) 50 MCG tablet Take 25mg  every day except on sundays take 50mg  48 tablet 0  . ondansetron (ZOFRAN-ODT) 8 MG disintegrating tablet Take 1 tablet (8 mg total) by mouth 2 (two) times daily. 180 tablet 1  . promethazine (PHENERGAN) 12.5 MG tablet Take 1 tablet (12.5 mg total) by mouth every 8 (eight) hours as needed for nausea or vomiting. 180 tablet 1  . rizatriptan (MAXALT-MLT) 10 MG disintegrating tablet Take 1 tablet earliest onset of migraine.  May repeat in 2 hours if needed.  Maximum 2 tablets in 24 hours 10 tablet 3   No current facility-administered medications for this visit.  Allergies as of 10/08/2020 - Review Complete 10/08/2020  Allergen Reaction Noted  . Reglan [metoclopramide] Other (See Comments) 03/28/2016    Family History  Problem Relation Age of Onset  . Thyroid disease Mother   . Diabetes Maternal Grandmother        and MGGM  . Heart disease Maternal Grandmother        and MGGM  . Breast cancer Other        and MGGM  . Thyroid disease Other        MGGM  . Colon cancer Neg Hx     Social History   Socioeconomic History  . Marital status: Single    Spouse name: Not on file  . Number of  children: 0  . Years of education: Not on file  . Highest education level: Not on file  Occupational History  . Not on file  Tobacco Use  . Smoking status: Never Smoker  . Smokeless tobacco: Never Used  Vaping Use  . Vaping Use: Never used  Substance and Sexual Activity  . Alcohol use: No    Alcohol/week: 0.0 standard drinks  . Drug use: No  . Sexual activity: Never    Comment: father smokes  Other Topics Concern  . Not on file  Social History Narrative   Lives at home with mom and 2 cats, visits dad every other weekend, will attend [employer], will start 9th grade in the fall.   Social Determinants of Health   Financial Resource Strain: Not on file  Food Insecurity: Not on file  Transportation Needs: Not on file  Physical Activity: Not on file  Stress: Not on file  Social Connections: Not on file  Intimate Partner Violence: Not on file    Review of Systems:    Constitutional: No weight loss, fever or chills Cardiovascular: No chest pain Respiratory: No SOB  Gastrointestinal: See HPI and otherwise negative   Physical Exam:  Vital signs: BP 128/66 (BP Location: Left Arm, Patient Position: Sitting, Cuff Size: Normal)   Pulse 80   Ht 5' 9.25" (1.759 m) Comment: height measured without shoes  Wt 191 lb 6 oz (86.8 kg)   BMI 28.06 kg/m   Constitutional:  Caucasian male appears to be in NAD, Well developed, Well nourished, alert and cooperative Respiratory: Respirations even and unlabored. Lungs clear to auscultation bilaterally.   No wheezes, crackles, or rhonchi.  Cardiovascular: Normal S1, S2. No MRG. Regular rate and rhythm. No peripheral edema, cyanosis or pallor.  Gastrointestinal:  Soft, nondistended, nontender. No rebound or guarding. Normal bowel sounds. No appreciable masses or hepatomegaly. Rectal:  Not performed.  Psychiatric: Demonstrates good judgement and reason without abnormal affect or behaviors.  RELEVANT LABS AND IMAGING: CBC     Component Value Date/Time   WBC 8.3 02/14/2020 1327   RBC 4.97 02/14/2020 1327   HGB 14.5 02/14/2020 1327   HCT 43.6 02/14/2020 1327   PLT 289 02/14/2020 1327   MCV 87.7 02/14/2020 1327   MCH 29.2 02/14/2020 1327   MCHC 33.3 02/14/2020 1327   RDW 12.8 02/14/2020 1327   LYMPHSABS 3.2 12/06/2019 1554   MONOABS 0.6 12/06/2019 1554   EOSABS 0.1 12/06/2019 1554   BASOSABS 0.1 12/06/2019 1554    CMP     Component Value Date/Time   NA 140 02/14/2020 1327   K 3.9 02/14/2020 1327   CL 103 02/14/2020 1327   CO2 27 02/14/2020 1327   GLUCOSE 104 (H) 02/14/2020 1327   BUN  11 02/14/2020 1327   CREATININE 1.02 02/14/2020 1327   CREATININE 0.85 05/22/2013 1710   CALCIUM 9.2 02/14/2020 1327   PROT 7.2 02/14/2020 1327   ALBUMIN 4.3 02/14/2020 1327   AST 17 02/14/2020 1327   ALT 15 02/14/2020 1327   ALKPHOS 102 02/14/2020 1327   BILITOT 0.5 02/14/2020 1327   GFRNONAA >60 02/14/2020 1327   GFRAA >60 02/14/2020 1327    Assessment: 1.  Chronic nausea and vomiting: Extensive work-up in the past which is all been negative, patient is doing okay with Zofran 8 mg 1-2 tabs a day  Plan: 1.  Reviewed labs that have been done this year, they are normal. 2.  Patient does not want to try Nortriptyline again. 3.  Patient is happy with Zofran 8 mg 1-2 tabs a day.  Discussed possibly scheduling these out but he likes to use them as needed.  Refilled his prescription with enough for a year. 4.  After I had left the room patient's mother requested that we draw some labs for him, mostly TSH, I told her I did not feel like we needed to draw this from a GI perspective, it turns out that the endocrinologist really likes to monitor this but he is not in the [facility] system and patient now has qualified for [facility] assistance.  She eventually decided they did not need them drawn while he was here.  I told her to call back and let us know if she needed something. 5.  Patient to follow in clinic in a year with Dr.  Adela Lank sooner if necessary.     Hyacinth Meeker, [name] [facility] 10/08/2020, 1:55 PM  Cc: Charles Done., MD

## 2020-10-08 NOTE — Patient Instructions (Signed)
If you are age 23 or older, your body mass index should be between 23-30. Your Body mass index is 28.06 kg/m. If this is out of the aforementioned range listed, please consider follow up with your Primary Care Provider.  If you are age 18 or younger, your body mass index should be between 19-25. Your Body mass index is 28.06 kg/m. If this is out of the aformentioned range listed, please consider follow up with your Primary Care Provider.   Thank you for choosing me and [facility].  Hyacinth Meeker, [name]

## 2020-10-12 NOTE — Progress Notes (Deleted)
NEUROLOGY FOLLOW UP OFFICE NOTE  GAMBLE ENDERLE [id number]   Subjective:  Charles Beard is a 24 year old male with chronic nausea and vomiting, hypothyroid, headaches, generalized anxiety disorder, and ADHD who follows up for migraines.  UPDATE: Intensity:  *** Duration:  *** Frequency:  *** Frequency of abortive medication: *** Current NSAIDS:  none Current analgesics:  none Current triptans:  Maxalt ODT 10mg  Current ergotamine:  none Current anti-emetic:  Zofran-ODT 8mg  twice daily; promethazine 12.5-25mg  daily at bdtime Current muscle relaxants:  none Current anti-anxiolytic:  none Current sleep aide:  none Current Antihypertensive medications:  none Current Antidepressant medications:  Nortriptyline 20mg  at bedtime Current Anticonvulsant medications:  none Current anti-CGRP:  none Current Vitamins/Herbal/Supplements:  MVI Current Antihistamines/Decongestants:  none Other therapy:  none Hormone/birth control:  none  Caffeine:  No coffee.  Pepsi daily.  Sometimes Mt Dew Diet:  Drinks 32 to 64 oz water daily.  Sometimes skips meals Exercise:  Not routine Depression:  no; Anxiety:  yes Other pain:  no Sleep:  6 to 7 hours uninterrupted sleep.  No daytime fatigue  HISTORY: He has had chronic nausea and episodic vomiting since 24 years old.  He underwent extensive workup, which was unremarkable.  This included EGD, CT abdomen and pelvis, abdominal ultrasound, and 24 hour urine porphyrins.  MRI of brain with and without contrast from 05/24/2016 personally reviewed was normal.  He has had ED visits for his vomiting, as recent as 02/14/2020.  He was previously using marijuana and was advised to stop.  He stated that he stopped use but symptoms did not improve.  However, UDS in the ED was positive for THC.  He reports daily headaches that started 4-5 months ago. It is a moderate to severe pounding right parietal and bilateral retro-orbital pain with associated photophobia and  phonophobia but no visual disturbance, numbness or weakness.  Nausea isn't particularly more severe with the headaches.   Ibuprofen or Tylenol would break the headache in 30 minutes but the headache would return in 2 hours and last until he wakes up the next morning.  As analgesics appear not to be effective, he has stopped taking them.  No specific triggers.  Sleep helped.  He was started on nortriptyline for his chronic nausea and vomiting in early February.  He hasn't had a headache in the past 2 days.  He reports prior history of more mild headaches that would occur once every couple of months.  Due to delayed growth and puberty onset, he also had an MRI of brain with attention to the pituitary with and without contrast on 05/30/2013, which was normal.  Past NSAIDS:  Ibuprofen 800mg  Past analgesics:  Tylenol Past abortive triptans:  none Past abortive ergotamine:  none Past muscle relaxants:  none Past anti-emetic:  none Past antihypertensive medications:  none Past antidepressant medications:  escitalopram Past anticonvulsant medications:  carbamazepine Past anti-CGRP:  none Past vitamins/Herbal/Supplements:  none Past antihistamines/decongestants:  Claritin Other past therapies:  none   Family history of headache:  Mom (migraines with aura); maternal aunt (migraine with aura); sister   PAST MEDICAL HISTORY: Past Medical History:  Diagnosis Date  . ADHD (attention deficit hyperactivity disorder) 2000  . Anxiety   . Chronic nausea   . Depression   . Headache(784.0)   . Hypothyroidism   . Prediabetes   . Puberty delay     MEDICATIONS: Current Outpatient Medications on File Prior to Visit  Medication Sig Dispense Refill  . levothyroxine (  SYNTHROID) 50 MCG tablet Take 25mg  every day except on sundays take 50mg  48 tablet 0  . ondansetron (ZOFRAN-ODT) 8 MG disintegrating tablet Take 1 tablet (8 mg total) by mouth 2 (two) times daily. 180 tablet 3  . promethazine (PHENERGAN)  12.5 MG tablet Take 1 tablet (12.5 mg total) by mouth every 8 (eight) hours as needed for nausea or vomiting. 180 tablet 1  . rizatriptan (MAXALT-MLT) 10 MG disintegrating tablet Take 1 tablet earliest onset of migraine.  May repeat in 2 hours if needed.  Maximum 2 tablets in 24 hours 10 tablet 3   No current facility-administered medications on file prior to visit.    ALLERGIES: Allergies  Allergen Reactions  . Reglan [Metoclopramide] Other (See Comments)    Seizure like activity    FAMILY HISTORY: Family History  Problem Relation Age of Onset  . Thyroid disease Mother   . Diabetes Maternal Grandmother        and MGGM  . Heart disease Maternal Grandmother        and MGGM  . Breast cancer Other        and MGGM  . Thyroid disease Other        MGGM  . Colon cancer Neg Hx    ***.  SOCIAL HISTORY: Social History   Socioeconomic History  . Marital status: Single    Spouse name: Not on file  . Number of children: 0  . Years of education: Not on file  . Highest education level: Not on file  Occupational History  . Not on file  Tobacco Use  . Smoking status: Never Smoker  . Smokeless tobacco: Never Used  Vaping Use  . Vaping Use: Never used  Substance and Sexual Activity  . Alcohol use: No    Alcohol/week: 0.0 standard drinks  . Drug use: No  . Sexual activity: Never    Comment: father smokes  Other Topics Concern  . Not on file  Social History Narrative   Lives at home with mom and 2 cats, visits dad every other weekend, will attend [employer], will start 9th grade in the fall.   Social Determinants of Health   Financial Resource Strain: Not on file  Food Insecurity: Not on file  Transportation Needs: Not on file  Physical Activity: Not on file  Stress: Not on file  Social Connections: Not on file  Intimate Partner Violence: Not on file     Objective:  *** General: No acute distress.  Patient appears ***-groomed.   Head:   Normocephalic/atraumatic Eyes:  Fundi examined but not visualized Neck: supple, no paraspinal tenderness, full range of motion Heart:  Regular rate and rhythm Lungs:  Clear to auscultation bilaterally Back: No paraspinal tenderness Neurological Exam: alert and oriented to person, place, and time. Attention span and concentration intact, recent and remote memory intact, fund of knowledge intact.  Speech fluent and not dysarthric, language intact.  CN II-XII intact. Bulk and tone normal, muscle strength 5/5 throughout.  Sensation to light touch, temperature and vibration intact.  Deep tendon reflexes 2+ throughout, toes downgoing.  Finger to nose and heel to shin testing intact.  Gait normal, Romberg negative.   Assessment/Plan:   ***  03-09-2005, DO  CC: ***

## 2020-10-13 ENCOUNTER — Ambulatory Visit: Payer: Medicaid Other | Admitting: Neurology

## 2020-10-13 ENCOUNTER — Telehealth: Payer: Self-pay

## 2020-10-13 NOTE — Telephone Encounter (Signed)
refill request received for promethazine 12.5 mg qhh prn, #180.  Pt saw Sinda Du on 10-08-20 and was doing well on Zofran.  Refills for Zofran sent.  Please advise

## 2020-10-14 MED ORDER — PROMETHAZINE HCL 12.5 MG PO TABS: 12.5000 mg | ORAL_TABLET | Freq: Three times a day (TID) | ORAL | 3 refills | Status: DC | PRN

## 2020-10-14 NOTE — Telephone Encounter (Signed)
Script sent  

## 2020-10-14 NOTE — Telephone Encounter (Signed)
Perfectly fine to refill Promethazine. -3 refills Thanks! JLL

## 2020-12-31 ENCOUNTER — Ambulatory Visit (INDEPENDENT_AMBULATORY_CARE_PROVIDER_SITE_OTHER): Payer: Medicaid Other | Admitting: "Endocrinology

## 2021-11-24 ENCOUNTER — Telehealth: Payer: Self-pay | Admitting: Gastroenterology

## 2021-11-24 NOTE — Telephone Encounter (Signed)
Called and spoke with patient. He states that he has been having difficulty swallowing all solid foods for the last few months. He states that he tolerates liquids with no problem. He states that he feels like he has to hesitate and take a deep breath before he swallows. He denies any nausea, vomiting, reflux symptoms, or weight loss.  He reports that he does have an enlarged thyroid. I told pt that he should inform his endocrinologist as well. I told patient that he needs to avoid any tough meats and breads. He has been advised to stay on a soft/pureed diet until his appt next week. Pt has been scheduled for a f/u appt with Dr. Adela Lank on Tuesday, 11/30/21 at 3:40 pm. Pt verbalized understanding and had no concerns at the end of the call.

## 2021-11-24 NOTE — Telephone Encounter (Signed)
Patient called states he is not able to swallow whenever he eats anything seeking help.

## 2021-11-30 ENCOUNTER — Ambulatory Visit (INDEPENDENT_AMBULATORY_CARE_PROVIDER_SITE_OTHER): Payer: Self-pay | Admitting: Gastroenterology

## 2021-11-30 ENCOUNTER — Encounter: Payer: Self-pay | Admitting: Gastroenterology

## 2021-11-30 VITALS — BP 140/100 | HR 90 | Ht 69.0 in | Wt 174.0 lb

## 2021-11-30 DIAGNOSIS — R110 Nausea: Secondary | ICD-10-CM

## 2021-11-30 DIAGNOSIS — R1310 Dysphagia, unspecified: Secondary | ICD-10-CM

## 2021-11-30 MED ORDER — OMEPRAZOLE 20 MG PO CPDR: 20.0000 mg | DELAYED_RELEASE_CAPSULE | Freq: Every day | ORAL | 3 refills | Status: DC

## 2021-11-30 MED ORDER — MIRTAZAPINE 15 MG PO TABS: 15.0000 mg | ORAL_TABLET | Freq: Every day | ORAL | 3 refills | Status: DC

## 2021-11-30 NOTE — Patient Instructions (Addendum)
If you are age 25 or older, your body mass index should be between 23-30. Your Body mass index is 25.7 kg/m. If this is out of the aforementioned range listed, please consider follow up with your Primary Care Provider.  If you are age 49 or younger, your body mass index should be between 19-25. Your Body mass index is 25.7 kg/m. If this is out of the aformentioned range listed, please consider follow up with your Primary Care Provider.   ________________________________________________________  The [place] [facility] providers would like to encourage you to use [facility] to communicate with providers for non-urgent requests or questions.  Due to long hold times on the telephone, sending your provider a message by [facility] may be a faster and more efficient way to get a response.  Please allow 48 business hours for a response.  Please remember that this is for non-urgent requests.  _______________________________________________________  Continue Zofran as needed for nausea.  We have sent the following medications to your pharmacy for you to pick up at your convenience: Omeprazole 20 mg : Take once daily Mirtazapine 15 mg: Take once daily at bedtime  Thank you for entrusting me with your care and for choosing [facility], Dr. Noonan Cellar

## 2021-11-30 NOTE — Progress Notes (Signed)
HPI :  26 year old male here for a follow-up visit, accompanied by his mother, for chronic nausea.  I last saw him in February 2021, he was last seen in the office by Hyacinth Meeker on December 2021. Please see prior notes for full details of his history.  Recall he has had longstanding chronic nausea with intermittent vomiting.  Extensive evaluation to include CT scan of the abdomen and pelvis, ultrasound of the abdomen, EGD (normal except small HH), gastric emptying study, MRI brain. 24-hour urine porphyrins which was normal. At one point in time he was using marijuana, he was told to stop this about 2 year ago and has abstained from it but did make any difference in his symptoms. Patient also has several other comorbidities including diagnosis of hypothyroidism, delayed puberty, major depressive disorder, ADHD, generalized anxiety disorder, and hypertension. He is followed by an endocrinologist and a neuropsychologist. He had been on several psychotropic medications in the past.   He also suffers from chronic headaches.  At his last visit I had placed him on nortriptyline dosed at 20 mg/day to treat his nausea and migraine headaches.  He previously was on Elavil but did not tolerate it due to drowsiness.  I referred him to neurology who recommended continuing nortriptyline and gave him Maxalt for breakthrough.  Over time he stopped taking the nortriptyline, states he did not like the way it made him feel.  Generally he has been taking Zofran as needed.  He states the nausea can come and go, he fulfill it frequently either every day to every other day.  That being said his appetite seems okay generally eats okay without vomiting at this point, the nausea overall appears improved from previous but still bothersome, at least he is not vomiting anymore.  Over the past 4 months he states he feels food get caught up in his posterior pharynx.  Only occurs to solids, does not occur with liquids.  States is very  proximal in his throat area.  No odynophagia.  He does have some periodic reflux its been bothering him. He is using NSAIDs to treat his headaches more so than the Maxalt.  He otherwise suffers from insomnia.  Mother reports she has developed some chronic testicular pain as well and inquires about referral to urology.  Of note he does not have insurance at this time.  Last EGD was in 2017  Past Medical History:  Diagnosis Date   ADHD (attention deficit hyperactivity disorder) 2000   Anxiety    Chronic nausea    Depression    Headache(784.0)    Hypothyroidism    Prediabetes    Puberty delay      Past Surgical History:  Procedure Laterality Date   ESOPHAGOGASTRODUODENOSCOPY (EGD) WITH PROPOFOL N/A 05/25/2016   Procedure: ESOPHAGOGASTRODUODENOSCOPY (EGD) WITH PROPOFOL;  Surgeon: Beverley Fiedler, MD;  Location: [facility];  Service: Endoscopy;  Laterality: N/A;   PLANTAR'S WART EXCISION     Family History  Problem Relation Age of Onset   Thyroid disease Mother    Diabetes Maternal Grandmother        and MGGM   Heart disease Maternal Grandmother        and MGGM   Breast cancer Other        and MGGM   Thyroid disease Other        MGGM   Colon cancer Neg Hx    Social History   Tobacco Use   Smoking status: Never   Smokeless  tobacco: Never  Vaping Use   Vaping Use: Never used  Substance Use Topics   Alcohol use: No    Alcohol/week: 0.0 standard drinks   Drug use: No   Current Outpatient Medications  Medication Sig Dispense Refill   ondansetron (ZOFRAN-ODT) 8 MG disintegrating tablet Take 1 tablet (8 mg total) by mouth 2 (two) times daily. 180 tablet 3   promethazine (PHENERGAN) 12.5 MG tablet Take 1 tablet (12.5 mg total) by mouth every 8 (eight) hours as needed for nausea or vomiting. 180 tablet 3   rizatriptan (MAXALT-MLT) 10 MG disintegrating tablet Take 1 tablet earliest onset of migraine.  May repeat in 2 hours if needed.  Maximum 2 tablets in 24 hours 10 tablet 3    levothyroxine (SYNTHROID) 50 MCG tablet Take 25mg  every day except on sundays take 50mg  (Patient not taking: Reported on 11/30/2021) 48 tablet 0   No current facility-administered medications for this visit.   Allergies  Allergen Reactions   Reglan [Metoclopramide] Other (See Comments)    Seizure like activity     Review of Systems: All systems reviewed and negative except where noted in HPI.   Lab Results  Component Value Date   WBC 8.3 02/14/2020   HGB 14.5 02/14/2020   HCT 43.6 02/14/2020   MCV 87.7 02/14/2020   PLT 289 02/14/2020    Lab Results  Component Value Date   CREATININE 1.02 02/14/2020   BUN 11 02/14/2020   NA 140 02/14/2020   K 3.9 02/14/2020   CL 103 02/14/2020   CO2 27 02/14/2020    Lab Results  Component Value Date   ALT 15 02/14/2020   AST 17 02/14/2020   ALKPHOS 102 02/14/2020   BILITOT 0.5 02/14/2020     Physical Exam: BP (!) 140/100    Pulse 90    Ht 5\' 9"  (1.753 m)    Wt 174 lb (78.9 kg)    BMI 25.70 kg/m  Constitutional: Pleasant,well-developed, male in no acute distress. HEENT: Normocephalic and atraumatic. Conjunctivae are normal. No scleral icterus. Oral pharynx normal, tonsils normal Psychiatric: Normal mood and affect. Behavior is normal.   ASSESSMENT AND PLAN: 26 year old male here for reassessment of following:  Chronic nausea Chronic headaches  Longstanding symptoms which are suspected to be functional.  He has responded to Zofran but does not take it too much, but does help when he needs it.  Despite this does endorse frequent nausea but not much vomiting.  Overall appears to be doing better since 1 of last seen him.  Taking a fair amount of NSAIDs for migraine headaches.  Did not tolerate 2 different TCAs.  Recommend he avoid NSAIDs and use Maxalt for abortive therapy.  He should follow-up with neurologist for chronic headache management to discuss other options.  We reviewed his case and discussed options for management  moving forward.  He is having some reflux symptoms that are bothering him, and now having some what appears to be mild dysphagia that is very proximal.  Normally would recommend an EGD in the setting to evaluate and perform dilation if needed.  We discussed what that would entail.  He currently does not have insurance and cannot afford it.  I reassured him that his last EGD looked okay and hopefully nothing concerning driving this.  Empirically we will start him on omeprazole 20 mg once daily for 30 days and see if that will help while he is waiting to get insurance.  We discussed other options for his  chronic nausea.  I think it has a pain will be a good choice for him in light of his insomnia, 15 mg nightly, perhaps this will also help his headaches.  Hopefully this helps his nausea and improves appetite as well.  We looked up pricing through [employer] and they should be able to afford both of these.  Otherwise continue Zofran as needed.  If symptoms persist despite these measures, specifically the dysphagia, once he gets insurance he will contact me to proceed with EGD.  Otherwise he should follow-up with urology and neurology in the interim.  Plan: - patient and mother working on getting insurance - start omeprazole 20mg  / day #30 RF3 - start Mirtazepine 15mg  q HS #30 RF3 - continue Zofran PRN - EGD when he has insurance if symptoms of dysphagia persist - follow up with Neurology and Urology  Harlin RainSteve Dazani Norby, MD [facility]

## 2022-06-02 ENCOUNTER — Encounter (INDEPENDENT_AMBULATORY_CARE_PROVIDER_SITE_OTHER): Payer: Self-pay

## 2022-06-16 ENCOUNTER — Other Ambulatory Visit: Payer: Self-pay | Admitting: Physician Assistant

## 2022-06-23 ENCOUNTER — Other Ambulatory Visit: Payer: Self-pay | Admitting: Oral Surgery

## 2022-06-27 ENCOUNTER — Other Ambulatory Visit: Payer: Self-pay

## 2022-06-27 MED ORDER — ONDANSETRON 8 MG PO TBDP: 8.0000 mg | ORAL_TABLET | Freq: Two times a day (BID) | ORAL | 1 refills | Status: DC

## 2022-11-11 ENCOUNTER — Telehealth: Payer: Self-pay

## 2022-11-11 NOTE — Telephone Encounter (Signed)
Mychart msg sent. AS, CMA 

## 2023-06-01 ENCOUNTER — Telehealth: Payer: Self-pay | Admitting: Family Medicine

## 2023-06-01 NOTE — Telephone Encounter (Signed)
Charles Beard with [employer] said they are trying to assign this pt to [employer] healthy blue and the group NPI number does not work.  she said the panel does not work.  She said needs to go to credentialing. Please advise as pt has appt 8/5

## 2023-06-01 NOTE — Telephone Encounter (Signed)
Not sure how to handle this.

## 2023-06-05 ENCOUNTER — Encounter: Payer: Self-pay | Admitting: Family

## 2023-06-05 ENCOUNTER — Ambulatory Visit (INDEPENDENT_AMBULATORY_CARE_PROVIDER_SITE_OTHER): Payer: Medicaid Other | Admitting: Family

## 2023-06-05 VITALS — BP 150/95 | HR 60 | Temp 98.3°F | Ht 70.25 in | Wt 235.6 lb

## 2023-06-05 DIAGNOSIS — R635 Abnormal weight gain: Secondary | ICD-10-CM

## 2023-06-05 DIAGNOSIS — R0789 Other chest pain: Secondary | ICD-10-CM | POA: Diagnosis not present

## 2023-06-05 DIAGNOSIS — Z13220 Encounter for screening for lipoid disorders: Secondary | ICD-10-CM

## 2023-06-05 DIAGNOSIS — R030 Elevated blood-pressure reading, without diagnosis of hypertension: Secondary | ICD-10-CM

## 2023-06-05 DIAGNOSIS — Z87898 Personal history of other specified conditions: Secondary | ICD-10-CM | POA: Diagnosis not present

## 2023-06-05 DIAGNOSIS — G43909 Migraine, unspecified, not intractable, without status migrainosus: Secondary | ICD-10-CM

## 2023-06-05 DIAGNOSIS — Z7689 Persons encountering health services in other specified circumstances: Secondary | ICD-10-CM | POA: Diagnosis not present

## 2023-06-05 DIAGNOSIS — Z8639 Personal history of other endocrine, nutritional and metabolic disease: Secondary | ICD-10-CM

## 2023-06-05 DIAGNOSIS — Z6833 Body mass index (BMI) 33.0-33.9, adult: Secondary | ICD-10-CM

## 2023-06-05 DIAGNOSIS — R41840 Attention and concentration deficit: Secondary | ICD-10-CM | POA: Diagnosis not present

## 2023-06-05 DIAGNOSIS — F411 Generalized anxiety disorder: Secondary | ICD-10-CM

## 2023-06-05 MED ORDER — HYDROXYZINE PAMOATE 25 MG PO CAPS: 25.0000 mg | ORAL_CAPSULE | Freq: Three times a day (TID) | ORAL | 1 refills | Status: AC | PRN

## 2023-06-05 NOTE — Progress Notes (Signed)
Pt needs referral for Neurology, Psychological, Endocrinology.  Pt wants fasting thyroid, A1c, and lipid panel.   Pt mom states Pt is having issues getting his GED.   Pt states sometimes his heart will hurt.

## 2023-06-05 NOTE — Progress Notes (Signed)
Subjective:    Charles Beard - 27 y.o. male MRN [id number]  Date of birth: [birth year]  HPI  Charles Beard is to establish care. He is accompanied by his mother.   Current issues and/or concerns: - Reports history of prediabetes. He was established with Endocrinology in the past and would like referral back to the same.  - Reports history of hypothyroidism. He was established with Endocrinology in the past and would like referral back to the same.  - Reports history of migraines. He denies associated red flag symptoms. He was established with Neurology in the past and would like referral back to the same. - Reports established with [employer] with the goal of trying to obtain his GED. Reports recently instructors said that he is having decreased attention.  - Anxiety. He states primarily related to "thinking of the worst case scenario with everything" and "over thinking". He denies thoughts of self-harm, suicidal ideations, homicidal ideations. He would like to try an anxiety medication. Requests referral to [facility].  - Intermittent chest discomfort. He does not report to the Emergency Department during episodes per his preference. States he thinks may be related to anxiety. - Reports he would like to lose weight. Reports he does "overeat". - Established with Gastroenterology for acid reflux management.  - No further issues/concerns for discussion today.    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/24/2016   Elevated hemoglobin A1c 03/12/2015   Essential hypertension, benign 03/12/2015   Hypothyroidism, acquired, 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   Dyspepsia 11/21/2013   Thyroiditis, 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 He has never used smokeless tobacco. He reports that he does not drink alcohol and does not use drugs.   Family History  family history includes Breast cancer in an other family member; Diabetes in his maternal grandmother; Heart disease in his maternal grandmother; Thyroid disease in his mother and another family member.   Medications: reviewed and updated   Objective:   Physical Exam BP (!) 150/95   Pulse 60   Temp 98.3 F (36.8 C) (Oral)   Ht 5' 10.25" (1.784 m)   Wt 235 lb 9.6 oz (106.9 kg)   SpO2 98%   BMI 33.56 kg/m   Physical Exam HENT:     Head: Normocephalic and atraumatic.     Nose: Nose normal.     Mouth/Throat:     Mouth: Mucous membranes are  moist.     Pharynx: Oropharynx is clear.  Eyes:     Extraocular Movements: Extraocular movements intact.     Conjunctiva/sclera: Conjunctivae normal.     Pupils: Pupils are equal, round, and reactive to light.  Cardiovascular:     Rate and Rhythm: Normal rate and regular rhythm.     Pulses: Normal pulses.     Heart sounds: Normal heart sounds.  Pulmonary:     Effort: Pulmonary effort is normal.     Breath sounds: Normal breath sounds.   Musculoskeletal:        General: Normal range of motion.     Cervical back: Normal range of motion and neck supple.  Neurological:     General: No focal deficit present.     Mental Status: He is alert and oriented to person, place, and time.  Psychiatric:        Mood and Affect: Mood normal.        Behavior: Behavior normal.        Assessment & Plan:  1. Encounter to establish care - Patient presents today to establish care. During the interim follow-up with primary provider as scheduled.  - Return for annual physical examination, labs, and health maintenance. Arrive fasting meaning having no food for at least 8 hours prior to appointment. You may have only water or black coffee. Please take scheduled medications as normal.  2. History of prediabetes  - Hemoglobin A1c - Ambulatory referral to Endocrinology  3. Screening cholesterol level - Routine screening.  - Lipid panel  4. History of hypothyroidism - Routine screening.  - Referral to Endocrinology for further evaluation/management.  - TSH - Ambulatory referral to Endocrinology  5. Migraine without status migrainosus, not intractable, unspecified migraine type - Referral to Neurology for further evaluation/management.  - Ambulatory referral to Neurology  6. GAD (generalized anxiety disorder) 7. Inattention - Patient denies thoughts of self-harm, suicidal ideations, homicidal ideations. - Hydroxyzine as prescribed. Counseled on medication adherence/adverse effects.  - Referral to Psychiatry for further evaluation/management. During the interim follow-up with primary provider in 4 weeks or sooner if needed until established with referral. - Ambulatory referral to Psychiatry - hydrOXYzine (VISTARIL) 25 MG capsule; Take 1 capsule (25 mg total) by mouth every 8 (eight) hours as needed.  Dispense: 30 capsule; Refill: 1  8. Chest discomfort - Patient today in office with no cardiopulmonary/acute distress.  - Referral to  Cardiology for further evaluation/management.  - Ambulatory referral to Cardiology  9. Encounter for weight management 10. BMI 33.0-33.9,adult 11. Weight gain - Referral to Medical Weight Management for further evaluation/management.  - Amb Ref to Medical Weight Management  12. Elevated blood pressure reading - Blood pressure not at goal during today's visit. Patient asymptomatic without chest pressure, chest pain, palpitations, shortness of breath, worst headache of life, and any additional red flag symptoms. - Follow-up with primary provider in 2 weeks or sooner if needed for blood pressure check.      Patient was given clear instructions to go to Emergency Department or return to medical center if symptoms don't improve, worsen, or new problems develop.The patient verbalized understanding.  I discussed the assessment and treatment plan with the patient. The patient was provided an opportunity to ask questions and all were answered. The patient agreed with the plan and demonstrated an understanding of the instructions.   The patient was advised to call back or seek an in-person evaluation if the symptoms worsen or if the condition fails to  improve as anticipated.    Ricky Stabs, NP 06/05/2023, 1:51 PM Primary Care at [facility]

## 2023-06-05 NOTE — Patient Instructions (Signed)
Thank you for choosing Primary Care at [facility] for your medical home!    Charles Beard was seen by Rema Fendt, NP today.   Charles Beard's primary care provider is Ricky Stabs, NP.   For the best care possible,  you should try to see Ricky Stabs, NP whenever you come to office.   We look forward to seeing you again soon!  If you have any questions about your visit today,  please call us at [phone]  Or feel free to reach your provider via MyChart.   Keeping you healthy   Get these tests Blood pressure- Have your blood pressure checked once a year by your healthcare provider.  Normal blood pressure is 120/80. Weight- Have your body mass index (BMI) calculated to screen for obesity.  BMI is a measure of body fat based on height and weight. You can also calculate your own BMI at https://www.west-esparza.com/. Cholesterol- Have your cholesterol checked regularly starting at age 58, sooner may be necessary if you have diabetes, high blood pressure, if a family member developed heart diseases at an early age or if you smoke.  Chlamydia, HIV, and other sexual transmitted disease- Get screened each year until the age of 60 then within three months of each new sexual partner. Diabetes- Have your blood sugar checked regularly if you have high blood pressure, high cholesterol, a family history of diabetes or if you are overweight.   Get these vaccines Flu shot- Every fall. Tetanus shot- Every 10 years. Menactra- Single dose; prevents meningitis.   Take these steps Don't smoke- If you do smoke, ask your healthcare provider about quitting. For tips on how to quit, go to www.smokefree.gov or call 1-800-QUIT-NOW. Be physically active- Exercise 5 days a week for at least 30 minutes.  If you are not already physically active start slow and gradually work up to 30 minutes of moderate physical activity.  Examples of moderate activity include walking briskly, mowing the yard, dancing, swimming  bicycling, etc. Eat a healthy diet- Eat a variety of healthy foods such as fruits, vegetables, low fat milk, low fat cheese, yogurt, lean meats, poultry, fish, beans, tofu, etc.  For more information on healthy eating, go to www.thenutritionsource.org Drink alcohol in moderation- Limit alcohol intake two drinks or less a day.  Never drink and drive. Dentist- Brush and floss teeth twice daily; visit your dentis twice a year. Depression-Your emotional health is as important as your physical health.  If you're feeling down, losing interest in things you normally enjoy please talk with your healthcare provider. Gun Safety- If you keep a gun in your home, keep it unloaded and with the safety lock on.  Bullets should be stored separately. Helmet use- Always wear a helmet when riding a motorcycle, bicycle, rollerblading or skateboarding. Safe sex- If you may be exposed to a sexually transmitted infection, use a condom Seat belts- Seat bels can save your life; always wear one. Smoke/Carbon Monoxide detectors- These detectors need to be installed on the appropriate level of your home.  Replace batteries at least once a year. Skin Cancer- When out in the sun, cover up and use sunscreen SPF 15 or higher. Violence- If anyone is threatening or hurting you, please tell your healthcare provider.

## 2023-06-06 ENCOUNTER — Encounter: Payer: Self-pay | Admitting: Family

## 2023-06-06 ENCOUNTER — Other Ambulatory Visit: Payer: Self-pay | Admitting: Family

## 2023-06-06 DIAGNOSIS — R7303 Prediabetes: Secondary | ICD-10-CM | POA: Insufficient documentation

## 2023-06-06 DIAGNOSIS — Z13220 Encounter for screening for lipoid disorders: Secondary | ICD-10-CM

## 2023-06-08 ENCOUNTER — Encounter: Payer: Self-pay | Admitting: Family Medicine

## 2023-06-09 ENCOUNTER — Telehealth: Payer: Self-pay | Admitting: Family Medicine

## 2023-06-09 NOTE — Telephone Encounter (Signed)
Called patient let him know we needed to reschedule 06/20/2023 for blood pressure check.  ( If patient calls [facility] it is okay to schedule him with provider.

## 2023-06-13 NOTE — Telephone Encounter (Signed)
Sorry for the late response. Charles Beard should contact a lead on her end for assistance , her portal should be able to match NPI [id number] and Tax ID [id number] with our clinic unless they required individual NPI for that provider assigned to this patient. Do you have a direct contact for Charles Beard?

## 2023-06-20 ENCOUNTER — Ambulatory Visit: Payer: Medicaid Other | Admitting: Family

## 2023-06-21 ENCOUNTER — Telehealth: Payer: Self-pay | Admitting: Family

## 2023-06-21 NOTE — Telephone Encounter (Signed)
Called pt and left vm to call office back for information about referrals and to schedule appt that was initially canceled due to no-answer on confirmation call by patient access advocate L.S.

## 2023-06-28 DIAGNOSIS — F419 Anxiety disorder, unspecified: Secondary | ICD-10-CM | POA: Diagnosis not present

## 2023-06-28 DIAGNOSIS — F32A Depression, unspecified: Secondary | ICD-10-CM | POA: Diagnosis not present

## 2023-06-28 DIAGNOSIS — Z553 Underachievement in school: Secondary | ICD-10-CM | POA: Diagnosis not present

## 2023-06-28 DIAGNOSIS — Z1331 Encounter for screening for depression: Secondary | ICD-10-CM | POA: Diagnosis not present

## 2023-06-28 DIAGNOSIS — Z79899 Other long term (current) drug therapy: Secondary | ICD-10-CM | POA: Diagnosis not present

## 2023-06-28 DIAGNOSIS — F902 Attention-deficit hyperactivity disorder, combined type: Secondary | ICD-10-CM | POA: Diagnosis not present

## 2023-07-19 DIAGNOSIS — F419 Anxiety disorder, unspecified: Secondary | ICD-10-CM | POA: Diagnosis not present

## 2023-07-19 DIAGNOSIS — Z553 Underachievement in school: Secondary | ICD-10-CM | POA: Diagnosis not present

## 2023-07-19 DIAGNOSIS — F32A Depression, unspecified: Secondary | ICD-10-CM | POA: Diagnosis not present

## 2023-07-19 DIAGNOSIS — F902 Attention-deficit hyperactivity disorder, combined type: Secondary | ICD-10-CM | POA: Diagnosis not present

## 2023-07-25 ENCOUNTER — Encounter: Payer: Self-pay | Admitting: Neurology

## 2023-09-06 DIAGNOSIS — F902 Attention-deficit hyperactivity disorder, combined type: Secondary | ICD-10-CM | POA: Diagnosis not present

## 2023-09-06 DIAGNOSIS — F32A Depression, unspecified: Secondary | ICD-10-CM | POA: Diagnosis not present

## 2023-09-06 DIAGNOSIS — Z553 Underachievement in school: Secondary | ICD-10-CM | POA: Diagnosis not present

## 2023-09-06 DIAGNOSIS — F419 Anxiety disorder, unspecified: Secondary | ICD-10-CM | POA: Diagnosis not present

## 2023-09-22 ENCOUNTER — Telehealth: Payer: Self-pay | Admitting: Gastroenterology

## 2023-09-22 NOTE — Telephone Encounter (Signed)
Inbound call from patient's mother stating patient has been having a difficult time swallowing. Requesting a call back to be advised further. Please advise, thank you.

## 2023-09-22 NOTE — Telephone Encounter (Signed)
I have spoken to patient's mother (ok per ROI). She states that patient was seen 10/2021 for difficulty swallowing and needed endoscopy. Unfortunately, he did not have insurance so could not get testing completed. He now has insurance and mother states patient is still experiencing dysphagia, now at times to liquids. Patient has been scheduled for re-evaluation by Quentin Mulling, [name] on 10/02/23 at 230 pm.

## 2023-10-02 ENCOUNTER — Other Ambulatory Visit (HOSPITAL_COMMUNITY): Payer: Self-pay | Admitting: *Deleted

## 2023-10-02 ENCOUNTER — Encounter: Payer: Self-pay | Admitting: Physician Assistant

## 2023-10-02 ENCOUNTER — Ambulatory Visit: Payer: Medicaid Other | Admitting: Physician Assistant

## 2023-10-02 VITALS — BP 130/80 | HR 73 | Ht 70.25 in | Wt 241.4 lb

## 2023-10-02 DIAGNOSIS — R1310 Dysphagia, unspecified: Secondary | ICD-10-CM

## 2023-10-02 DIAGNOSIS — R112 Nausea with vomiting, unspecified: Secondary | ICD-10-CM | POA: Diagnosis not present

## 2023-10-02 DIAGNOSIS — F419 Anxiety disorder, unspecified: Secondary | ICD-10-CM | POA: Diagnosis not present

## 2023-10-02 DIAGNOSIS — R1319 Other dysphagia: Secondary | ICD-10-CM

## 2023-10-02 DIAGNOSIS — R110 Nausea: Secondary | ICD-10-CM

## 2023-10-02 MED ORDER — ONDANSETRON 8 MG PO TBDP: 8.0000 mg | ORAL_TABLET | Freq: Two times a day (BID) | ORAL | 3 refills | Status: AC

## 2023-10-02 NOTE — Patient Instructions (Signed)
_______________________________________________________  If your blood pressure at your visit was 140/90 or greater, please contact your primary care physician to follow up on this.  If you are age 26 or younger, your body mass index should be between 19-25. Your Body mass index is 34.39 kg/m. If this is out of the aformentioned range listed, please consider follow up with your Primary Care Provider.  ________________________________________________________  The  GI providers would like to encourage you to use [facility] to communicate with providers for non-urgent requests or questions.  Due to long hold times on the telephone, sending your provider a message by [facility] may be a faster and more efficient way to get a response.  Please allow 48 business hours for a response.  Please remember that this is for non-urgent requests.  _______________________________________________________  Bonita Quin have been scheduled for a modified barium swallow on 11-08-23 at 11:30 am. Please arrive 15 minutes prior to your test for registration. You will go to [facility] (1st Floor) for your appointment. Should you need to cancel or reschedule your appointment, please contact [phone] Patrcia Dolly [place]) or [phone] [facility]). _____________________________________________________________________ A Modified Barium Swallow Study, or MBS, is a special x-ray that is taken to check swallowing skills. It is carried out by a Marine scientist and a Warehouse manager (SLP). During this test, yourmouth, throat, and esophagus, a muscular tube which connects your mouth to your stomach, is checked. The test will help you, your doctor, and the SLP plan what types of foods and liquids are easier for you to swallow. The SLP will also identify positions and ways to help you swallow more easily and safely. What will happen during an MBS? You will be taken to an x-ray room and seated comfortably. You will be asked to  swallow small amounts of food and liquid mixed with barium. Barium is a liquid or paste that allows images of your mouth, throat and esophagus to be seen on x-ray. The x-ray captures moving images of the food you are swallowing as it travels from your mouth through your throat and into your esophagus. This test helps identify whether food or liquid is entering your lungs (aspiration). The test also shows which part of your mouth or throat lacks strength or coordination to move the food or liquid in the right direction. This test typically takes 30 minutes to 1 hour to complete. _______________________________________________________________________  Bonita Quin have been scheduled for a Barium Esophogram at [facility] (1st floor of the hospital) on 11-08-23 at 12pm.  __________________________________________________________________ A barium swallow is an examination that concentrates on views of the esophagus. This tends to be a double contrast exam (barium and two liquids which, when combined, create a gas to distend the wall of the oesophagus) or single contrast (non-ionic iodine based). The study is usually tailored to your symptoms so a good history is essential. Attention is paid during the study to the form, structure and configuration of the esophagus, looking for functional disorders (such as aspiration, dysphagia, achalasia, motility and reflux)  EXAMINATION  You may be asked to change into a gown, depending on the type of swallow being performed. A radiologist and radiographer will perform the procedure. The radiologist will advise you of the type of contrast selected for your procedure and direct you during the exam. You will be asked to stand, sit or lie in several different positions and to hold a small amount of fluid in your mouth before being asked to swallow while the imaging is performed .  In some instances you may be asked to swallow barium coated marshmallows to assess the motility of  a solid food bolus. The exam can be recorded as a digital or video fluoroscopy procedure.  POST PROCEDURE It will take 1-2 days for the barium to pass through your system. To facilitate this, it is important, unless otherwise directed, to increase your fluids for the next 24-48hrs and to resume your normal diet.  This test typically takes about 30 minutes to perform. __________________________________________________________________________________   Behavioral and Dietary Strategies for Management of Esophageal Dysmotility/dysphagia 1. Take reflux medications 30+ minutes before food in the morning.  2. Begin meals with warm beverage 3. Eat smaller more frequent meals 4. Eat slowly, taking small bites and sips 5. Alternate solids and liquids 6. Avoid foods/liquids that increase acid production 7. Sit upright during and for 30+ minutes after meals to facilitate esophageal clearing 8. Can try altoid melting in mouth before food  Please take your proton pump inhibitor medication, protonix 40 mg daily Please take this medication 30 minutes to 1 hour before meals- this makes it more effective.  Avoid spicy and acidic foods Avoid fatty foods Limit your intake of coffee, tea, alcohol, and carbonated drinks Work to maintain a healthy weight Keep the head of the bed elevated at least 3 inches with blocks or a wedge pillow if you are having any nighttime symptoms Stay upright for 2 hours after eating Avoid meals and snacks three to four hours before bedtime  Due to recent changes in healthcare laws, you may see the results of your imaging and laboratory studies on MyChart before your provider has had a chance to review them.  We understand that in some cases there may be results that are confusing or concerning to you. Not all laboratory results come back in the same time frame and the provider may be waiting for multiple results in order to interpret others.  Please give [place] 48 hours in order for  your provider to thoroughly review all the results before contacting the office for clarification of your results.   Thank you for entrusting me with your care and choosing [facility].  Quentin Mulling, [name]

## 2023-10-02 NOTE — Progress Notes (Unsigned)
10/02/2023 Charles Beard [id number] Mar 13, 1996  Referring provider: Rema Fendt, NP Primary GI doctor: Dr. Adela Lank  ASSESSMENT AND PLAN:   Oropharyngeal dysphagia with liquids and solids GERD occasionally improved with liquid medication from [place] Has been off PPI and declines restarting Discussed potential EGD with patient's history of gastric ulcers and dysphagia but patient has high anxiety about being put to sleep for endoscopy and it sounds more like oropharyngeal upper dysphagia We will get barium swallow and modified barium swallow to evaluate Given dysphagia/dysmotility diet Suggested Protonix, patient declines  Chronic nausea vomiting Chronic for over 5 years significant workup in the past with unremarkable MRI brain, upper GI series, gastric emptying study, ultrasound, CT abdomen pelvis Stopped marijuana use over 2 years ago with question below occasional use per mother Has chronic migraines with aura, this is possible this can be related to patient's nausea and vomiting would suggest following up with neurology as he has appointment and try preventative therapy for migraines Refilled Zofran Suggest PPI declines at this time Consider repeat EGD  Anxiety High anxiety, possible functional symptoms of nausea vomiting with dysphagia Follow-up primary care and continue treatment   Patient Care Team: Rema Fendt, NP as PCP - General (Nurse Practitioner)  HISTORY OF PRESENT ILLNESS: 27 y.o. male with a past medical history of hypothyroidism, delayed puberty, major depressive disorder, ADHD, generalized anxiety disorder, and hypertension  and longstanding chronic nausea with intermittent vomiting and others listed below presents for evaluation of dysphagia.   He is followed by an endocrinologist and a neuropsychologist. He had been on several psychotropic medications in the past.   12/06/2019 patient seen by Dr. Adela Lank in clinic. Please see his extensive  note at that time. Patient has had extensive evaluation including CT scan of the abdomen pelvis, ultrasound of the abdomen, EGD (normal except small HH), gastric emptying study, MRI of the brain and previously had a 24-hour urine porphyrins which was normal. Also had history of marijuana use which she stopped 2 years ago and it has not made any difference to his symptoms.  11/20/2021 office visit with Dr. Adela Lank started on mirtazapine 30 mg nightly and omeprazole 20 mg daily EGD when patient has insurance to dysphagia, follow-up neurology neurology.  2017 EGD with gastric ulcers He continues to have dysphagia with solids and liquids, more oropharyngeal. Will have anything he eats, can also have an issues with liquids.  He will feel like if he has "anything" in my mouth, his brain will not tell him to swallow well and he will get choked up.  He denies feeling it will go down the wrong pipe and eventually he can swallow better.  Will try to make throat straight and that helps. He continues to have GERD daily, worse in the morning, has been using a liquid from [place] that his GF gave him, mom thinks it is [place], can be numbing.  He is off the omeprazole for at least 2 years.  He has BM daily, 2-3 x a day, normally formed to loose, no melena, no hematochezia. States it is dependent on what he eats.  He has OV with Dr. Everlena Cooper for migraines, has once every 1-2 weeks, will have aura with it. Has nausea daily, vomiting occ. Can have vertigo with turning quickly/standing.   He  reports that he has never smoked. He has never used smokeless tobacco. He reports that he does not drink alcohol and does not use drugs.  RELEVANT LABS AND IMAGING:  CBC    Component Value Date/Time   WBC 8.3 02/14/2020 1327   RBC 4.97 02/14/2020 1327   HGB 14.5 02/14/2020 1327   HCT 43.6 02/14/2020 1327   PLT 289 02/14/2020 1327   MCV 87.7 02/14/2020 1327   MCH 29.2 02/14/2020 1327   MCHC 33.3 02/14/2020 1327   RDW  12.8 02/14/2020 1327   LYMPHSABS 3.2 12/06/2019 1554   MONOABS 0.6 12/06/2019 1554   EOSABS 0.1 12/06/2019 1554   BASOSABS 0.1 12/06/2019 1554   No results for input(s): "HGB" in the last 8760 hours.  CMP     Component Value Date/Time   NA 140 02/14/2020 1327   K 3.9 02/14/2020 1327   CL 103 02/14/2020 1327   CO2 27 02/14/2020 1327   GLUCOSE 104 (H) 02/14/2020 1327   BUN 11 02/14/2020 1327   CREATININE 1.02 02/14/2020 1327   CREATININE 0.85 05/22/2013 1710   CALCIUM 9.2 02/14/2020 1327   PROT 7.2 02/14/2020 1327   ALBUMIN 4.3 02/14/2020 1327   AST 17 02/14/2020 1327   ALT 15 02/14/2020 1327   ALKPHOS 102 02/14/2020 1327   BILITOT 0.5 02/14/2020 1327   GFRNONAA >60 02/14/2020 1327   GFRAA >60 02/14/2020 1327      Latest Ref Rng & Units 02/14/2020    1:27 PM 12/06/2019    3:54 PM 07/17/2018    3:46 PM  Hepatic Function  Total Protein 6.5 - 8.1 g/dL 7.2  7.7  6.9   Albumin 3.5 - 5.0 g/dL 4.3  4.8  4.4   AST 15 - 41 U/L 17  16  12    ALT 0 - 44 U/L 15  16  10    Alk Phosphatase 38 - 126 U/L 102  109  128   Total Bilirubin 0.3 - 1.2 mg/dL 0.5  0.4  0.5   Bilirubin, Direct 0.0 - 0.3 mg/dL  0.1        Current Medications:        Current Outpatient Medications (Other):    buPROPion (WELLBUTRIN XL) 150 MG 24 hr tablet, Take 150 mg by mouth daily.   hydrOXYzine (VISTARIL) 25 MG capsule, Take 1 capsule (25 mg total) by mouth every 8 (eight) hours as needed.   ondansetron (ZOFRAN-ODT) 8 MG disintegrating tablet, Take 1 tablet (8 mg total) by mouth 2 (two) times daily.  Medical History:  Past Medical History:  Diagnosis Date   ADHD (attention deficit hyperactivity disorder) 2000   Anxiety    Chronic nausea    Depression    Headache(784.0)    Hypothyroidism    Prediabetes    Puberty delay    Allergies:  Allergies  Allergen Reactions   Reglan [Metoclopramide] Other (See Comments)    Seizure like activity     Surgical History:  He  has a past surgical history  that includes Plantar's wart excision and Esophagogastroduodenoscopy (egd) with propofol (N/A, 05/25/2016). Family History:  His family history includes Breast cancer in an other family member; Diabetes in his maternal grandmother; Heart disease in his maternal grandmother; Thyroid disease in his mother and another family member.  REVIEW OF SYSTEMS  : All other systems reviewed and negative except where noted in the History of Present Illness.  PHYSICAL EXAM: BP 130/80 (BP Location: Left Arm, Patient Position: Sitting, Cuff Size: Normal)   Pulse 73   Ht 5' 10.25" (1.784 m)   Wt 241 lb 6 oz (109.5 kg)   SpO2 98%   BMI 34.39 kg/m  General  Appearance: Well nourished, in no apparent distress. Head:   Normocephalic and atraumatic. Eyes:  sclerae anicteric,conjunctive pink  Respiratory: Respiratory effort normal, BS equal bilaterally without rales, rhonchi, wheezing. Cardio: RRR with no MRGs. Peripheral pulses intact.  Abdomen: Soft,  Obese ,active bowel sounds. No tenderness . No masses. Rectal: Not evaluated Musculoskeletal: Full ROM, Normal gait. Without edema. Skin:  Dry and intact without significant lesions or rashes Neuro: Alert and  oriented x4;  No focal deficits. Psych:  Cooperative. Normal mood and affect.    Doree Albee, [name] 3:22 PM

## 2023-10-02 NOTE — Progress Notes (Signed)
Agree with assessment and plan as outlined.  

## 2023-10-09 ENCOUNTER — Encounter: Payer: Medicaid Other | Admitting: Family Medicine

## 2023-11-02 ENCOUNTER — Encounter (HOSPITAL_COMMUNITY): Payer: Medicaid Other

## 2023-11-08 ENCOUNTER — Ambulatory Visit (HOSPITAL_COMMUNITY): Payer: Medicaid Other

## 2023-11-08 ENCOUNTER — Ambulatory Visit (HOSPITAL_COMMUNITY)
Admission: RE | Admit: 2023-11-08 | Discharge: 2023-11-08 | Disposition: A | Discharge status: Home / Self Care | Payer: Medicaid Other | Source: Ambulatory Visit | Attending: Family | Admitting: Family

## 2023-11-08 ENCOUNTER — Ambulatory Visit (HOSPITAL_COMMUNITY): Admission: RE | Admit: 2023-11-08 | Payer: Medicaid Other | Source: Ambulatory Visit

## 2023-11-08 DIAGNOSIS — R110 Nausea: Secondary | ICD-10-CM

## 2023-11-08 DIAGNOSIS — R1319 Other dysphagia: Secondary | ICD-10-CM

## 2023-11-21 ENCOUNTER — Encounter (INDEPENDENT_AMBULATORY_CARE_PROVIDER_SITE_OTHER): Payer: Medicaid Other | Admitting: Adult Health

## 2023-11-28 ENCOUNTER — Encounter (HOSPITAL_COMMUNITY): Payer: Self-pay

## 2023-11-28 ENCOUNTER — Ambulatory Visit (HOSPITAL_COMMUNITY): Payer: Medicaid Other

## 2023-11-28 ENCOUNTER — Ambulatory Visit (HOSPITAL_COMMUNITY): Admission: RE | Admit: 2023-11-28 | Payer: Medicaid Other | Source: Ambulatory Visit

## 2023-12-21 ENCOUNTER — Ambulatory Visit: Payer: Medicaid Other | Admitting: Neurology

## 2023-12-28 DIAGNOSIS — F32A Depression, unspecified: Secondary | ICD-10-CM | POA: Diagnosis not present

## 2023-12-28 DIAGNOSIS — F902 Attention-deficit hyperactivity disorder, combined type: Secondary | ICD-10-CM | POA: Diagnosis not present

## 2023-12-28 DIAGNOSIS — F419 Anxiety disorder, unspecified: Secondary | ICD-10-CM | POA: Diagnosis not present

## 2023-12-29 ENCOUNTER — Telehealth (HOSPITAL_COMMUNITY): Payer: Self-pay | Admitting: *Deleted

## 2023-12-29 NOTE — Telephone Encounter (Signed)
Attempted to contact patient to reschedule OP MBS. Left VM. RKEEL

## 2024-01-04 ENCOUNTER — Telehealth (HOSPITAL_COMMUNITY): Payer: Self-pay | Admitting: *Deleted

## 2024-01-04 NOTE — Progress Notes (Deleted)
 NEUROLOGY CONSULTATION NOTE  THOMSON HERBERS MRN: [id number] DOB: December 10, 1995  Referring provider: Ricky Stabs, NP Primary care provider: Ricky Stabs, NP  Reason for consult:  migraines  Assessment/Plan:   ***   Subjective:  Charles Beard. Deskins is a 28 year old male with chronic nausea and vomiting, hypothyroid, headaches, generalized anxiety disorder, and ADHD who presents for migraines.       He has had chronic nausea and episodic vomiting since 28 years old.  He underwent extensive workup, which was unremarkable.  This included EGD, CT abdomen and pelvis, abdominal ultrasound, and 24 hour urine porphyrins.  MRI of brain with and without contrast from 05/24/2016 personally reviewed was normal.  He has had ED visits for his vomiting, as recent as 02/14/2020.  He was previously using marijuana and was advised to stop.  He stated that he stopped use but symptoms did not improve.  However, UDS in the ED was positive for THC.  He reports daily headaches that started 4-5 months ago. It is a moderate to severe pounding right parietal and bilateral retro-orbital pain with associated photophobia and phonophobia but no visual disturbance, numbness or weakness.  Nausea isn't particularly more severe with the headaches.   Ibuprofen or Tylenol would break the headache in 30 minutes but the headache would return in 2 hours and last until he wakes up the next morning.  As analgesics appear not to be effective, he has stopped taking them.  No specific triggers.  Sleep helped.  He was started on nortriptyline for his chronic nausea and vomiting in early February.  He hasn't had a headache in the past 2 days.  He reports prior history of more mild headaches that would occur once every couple of months.   Due to delayed growth and puberty onset, he also had an MRI of brain with attention to the pituitary with and without contrast on 05/30/2013, which was normal.     Current NSAIDS:  none Current analgesics:   none Current triptans:  none Current ergotamine:  none Current anti-emetic:  Zofran-ODT 8mg  twice daily Current muscle relaxants:  none Current anti-anxiolytic:  none Current sleep aide:  none Current Antihypertensive medications:  none Current Antidepressant medications:  Wellbutrin XL 150mg  daily Current Anticonvulsant medications:  none Current anti-CGRP:  none Current Vitamins/Herbal/Supplements:  MVI Current Antihistamines/Decongestants:  none Other therapy:  none Hormone/birth control:  none   Past NSAIDS:  Ibuprofen 800mg  Past analgesics:  Tylenol Past abortive triptans:  none Past abortive ergotamine:  none Past muscle relaxants:  none Past anti-emetic:  promethazine 12.5-25mg  daily at bdtime Past antihypertensive medications:  none Past antidepressant medications:  amitriptyline, nortriptyline, escitalopram Past anticonvulsant medications:  carbamazepine Past anti-CGRP:  none Past vitamins/Herbal/Supplements:  none Past antihistamines/decongestants:  Claritin Other past therapies:  none   Caffeine:  No coffee.  Pepsi daily.  Sometimes Mt Dew Diet:  Drinks 32 to 64 oz water daily.  Sometimes skips meals Exercise:  Not routine Depression:  no; Anxiety:  yes Other pain:  no Sleep:  6 to 7 hours uninterrupted sleep.  No daytime fatigue Family history of headache:  Mom (migraines with aura); maternal aunt (migraine with aura); sister      PAST MEDICAL HISTORY: Past Medical History:  Diagnosis Date   ADHD (attention deficit hyperactivity disorder) 2000   Anxiety    Chronic nausea    Depression    Headache(784.0)    Hypothyroidism    Prediabetes    Puberty delay  PAST SURGICAL HISTORY: Past Surgical History:  Procedure Laterality Date   ESOPHAGOGASTRODUODENOSCOPY (EGD) WITH PROPOFOL N/A 05/25/2016   Procedure: ESOPHAGOGASTRODUODENOSCOPY (EGD) WITH PROPOFOL;  Surgeon: Beverley Fiedler, MD;  Location: [facility];  Service: Endoscopy;  Laterality: N/A;    PLANTAR'S WART EXCISION      MEDICATIONS: Current Outpatient Medications on File Prior to Visit  Medication Sig Dispense Refill   buPROPion (WELLBUTRIN XL) 150 MG 24 hr tablet Take 150 mg by mouth daily.     hydrOXYzine (VISTARIL) 25 MG capsule Take 1 capsule (25 mg total) by mouth every 8 (eight) hours as needed. 30 capsule 1   ondansetron (ZOFRAN-ODT) 8 MG disintegrating tablet Take 1 tablet (8 mg total) by mouth 2 (two) times daily. 60 tablet 3   No current facility-administered medications on file prior to visit.    ALLERGIES: Allergies  Allergen Reactions   Reglan [Metoclopramide] Other (See Comments)    Seizure like activity    FAMILY HISTORY: Family History  Problem Relation Age of Onset   Thyroid disease Mother    Diabetes Maternal Grandmother        and MGGM   Heart disease Maternal Grandmother        and MGGM   Breast cancer Other        and MGGM   Thyroid disease Other        MGGM   Colon cancer Neg Hx     Objective:  *** General: No acute distress.  Patient appears well-groomed.   Head:  Normocephalic/atraumatic Eyes:  fundi examined but not visualized Neck: supple, no paraspinal tenderness, full range of motion Back: No paraspinal tenderness Heart: regular rate and rhythm Lungs: Clear to auscultation bilaterally. Vascular: No carotid bruits. Neurological Exam: Mental status: alert and oriented to person, place, and time, speech fluent and not dysarthric, language intact. Cranial nerves: CN I: not tested CN II: pupils equal, round and reactive to light, visual fields intact CN III, IV, VI:  full range of motion, no nystagmus, no ptosis CN V: facial sensation intact. CN VII: upper and lower face symmetric CN VIII: hearing intact CN IX, X: gag intact, uvula midline CN XI: sternocleidomastoid and trapezius muscles intact CN XII: tongue midline Bulk & Tone: normal, no fasciculations. Motor:  muscle strength 5/5 throughout Sensation:  Pinprick,  temperature and vibratory sensation intact. Deep Tendon Reflexes:  2+ throughout,  toes downgoing.   Finger to nose testing:  Without dysmetria.   Heel to shin:  Without dysmetria.   Gait:  Normal station and stride.  Romberg negative.    Thank you for allowing me to take part in the care of this patient.  Shon Millet, DO  CC: ***

## 2024-01-04 NOTE — Telephone Encounter (Signed)
 Attempted to contact patient to reschedule OP MBS. Left VM @[phone] and [phone]. RKEEL

## 2024-01-05 ENCOUNTER — Ambulatory Visit: Payer: Medicaid Other | Admitting: Neurology

## 2024-01-15 DIAGNOSIS — R1084 Generalized abdominal pain: Secondary | ICD-10-CM | POA: Diagnosis not present

## 2024-01-15 DIAGNOSIS — R112 Nausea with vomiting, unspecified: Secondary | ICD-10-CM | POA: Diagnosis not present

## 2024-01-15 DIAGNOSIS — K591 Functional diarrhea: Secondary | ICD-10-CM | POA: Diagnosis not present

## 2024-01-15 DIAGNOSIS — R509 Fever, unspecified: Secondary | ICD-10-CM | POA: Diagnosis not present

## 2024-01-30 ENCOUNTER — Ambulatory Visit (HOSPITAL_COMMUNITY)

## 2024-01-30 ENCOUNTER — Ambulatory Visit (HOSPITAL_COMMUNITY): Admission: RE | Admit: 2024-01-30 | Source: Ambulatory Visit

## 2024-04-22 DIAGNOSIS — R519 Headache, unspecified: Secondary | ICD-10-CM | POA: Diagnosis not present

## 2024-04-22 DIAGNOSIS — M791 Myalgia, unspecified site: Secondary | ICD-10-CM | POA: Diagnosis not present

## 2024-07-08 ENCOUNTER — Encounter (HOSPITAL_COMMUNITY): Payer: Self-pay | Admitting: Emergency Medicine

## 2024-07-08 ENCOUNTER — Other Ambulatory Visit: Payer: Self-pay

## 2024-07-08 ENCOUNTER — Emergency Department (HOSPITAL_COMMUNITY)
Admission: EM | Admit: 2024-07-08 | Discharge: 2024-07-08 | Disposition: A | Discharge status: Home / Self Care | Attending: Emergency Medicine | Admitting: Emergency Medicine

## 2024-07-08 DIAGNOSIS — E039 Hypothyroidism, unspecified: Secondary | ICD-10-CM | POA: Insufficient documentation

## 2024-07-08 DIAGNOSIS — R059 Cough, unspecified: Secondary | ICD-10-CM | POA: Diagnosis present

## 2024-07-08 DIAGNOSIS — I10 Essential (primary) hypertension: Secondary | ICD-10-CM | POA: Diagnosis not present

## 2024-07-08 DIAGNOSIS — U071 COVID-19: Secondary | ICD-10-CM | POA: Insufficient documentation

## 2024-07-08 LAB — RESP PANEL BY RT-PCR (RSV, FLU A&B, COVID)  RVPGX2
Influenza A by PCR: NEGATIVE
Influenza B by PCR: NEGATIVE
Resp Syncytial Virus by PCR: NEGATIVE
SARS Coronavirus 2 by RT PCR: POSITIVE — AB

## 2024-07-08 MED ORDER — DIPHENHYDRAMINE HCL 50 MG/ML IJ SOLN
12.5000 mg | Freq: Once | INTRAMUSCULAR | Status: AC
  Administered 2024-07-08: 12.5 mg via INTRAVENOUS
  Filled 2024-07-08: qty 1

## 2024-07-08 MED ORDER — SODIUM CHLORIDE 0.9 % IV BOLUS
500.0000 mL | Freq: Once | INTRAVENOUS | Status: AC
  Administered 2024-07-08: 500 mL via INTRAVENOUS

## 2024-07-08 MED ORDER — KETOROLAC TROMETHAMINE 15 MG/ML IJ SOLN
15.0000 mg | Freq: Once | INTRAMUSCULAR | Status: AC
  Administered 2024-07-08: 15 mg via INTRAVENOUS
  Filled 2024-07-08: qty 1

## 2024-07-08 MED ORDER — ONDANSETRON 4 MG PO TBDP: 4.0000 mg | ORAL_TABLET | Freq: Three times a day (TID) | ORAL | 0 refills | Status: AC | PRN

## 2024-07-08 MED ORDER — PROCHLORPERAZINE EDISYLATE 10 MG/2ML IJ SOLN
10.0000 mg | Freq: Once | INTRAMUSCULAR | Status: AC
  Administered 2024-07-08: 10 mg via INTRAVENOUS
  Filled 2024-07-08: qty 2

## 2024-07-08 NOTE — ED Triage Notes (Signed)
 Pt arrives w/ mother w/ c/o headache, sore throat, cough, and emesis x 1 day

## 2024-07-08 NOTE — ED Provider Notes (Signed)
 [place] EMERGENCY DEPARTMENT AT [facility] Provider Note  CSN: [id number] Arrival date & time: 07/08/24 9773  Chief Complaint(s) Sore Throat, Headache, and Emesis  HPI Charles Beard is a 28 y.o. male with a past medical history listed below including migraine headaches here for 1 day of gradually worsening sore throat with nasal congestion, cough, myalgias and headache.  Patient also having nausea and nonbloody nonbilious emesis.  Positive sick contacts at home stating that his aunt was sick several days ago.  The history is provided by the patient and a parent.    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   Elevated hemoglobin A1c 03/12/2015   Essential hypertension, benign 03/12/2015   Hypothyroidism, acquired, 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   Dyspepsia 11/21/2013   Thyroiditis, 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 Authorizing Provider  ondansetron  (ZOFRAN -ODT) 4 MG disintegrating  tablet Take 1 tablet (4 mg total) by mouth every 8 (eight) hours as needed for up to 3 days for nausea or vomiting. 07/08/24 07/11/24 Yes Nasim Habeeb, Raynell Moder, MD  buPROPion (WELLBUTRIN XL) 150 MG 24 hr tablet Take 150 mg by mouth daily. 06/29/23   [provider]  hydrOXYzine  (VISTARIL ) 25 MG capsule Take 1 capsule (25 mg total) by mouth every 8 (eight) hours as needed. 06/05/23   Lorren Greig PARAS, NP                                                                                                                                    Allergies Reglan  [metoclopramide ]  Review of Systems Review of Systems As noted in HPI  Physical Exam Vital Signs  I have reviewed the triage vital signs BP 139/86   Pulse 89   Temp (!) 100.7 F (38.2 C) (Oral)   Resp 18   SpO2 100%   Physical Exam Vitals reviewed.  Constitutional:      General: He is not in acute distress.  Appearance: He is well-developed. He is not diaphoretic.  HENT:     Head: Normocephalic and atraumatic.     Right Ear: Tympanic membrane normal.     Left Ear: Tympanic membrane normal.     Nose: Nose normal.     Mouth/Throat:     Pharynx: Posterior oropharyngeal erythema (mild) present. No pharyngeal swelling or oropharyngeal exudate.     Tonsils: No tonsillar exudate.  Eyes:     General: No scleral icterus.       Right eye: No discharge.        Left eye: No discharge.     Conjunctiva/sclera: Conjunctivae normal.     Pupils: Pupils are equal, round, and reactive to light.  Cardiovascular:     Rate and Rhythm: Normal rate and regular rhythm.     Heart sounds: No murmur heard.    No friction rub. No gallop.  Pulmonary:     Effort: Pulmonary effort is normal. No respiratory distress.     Breath sounds: Normal breath sounds. No stridor. No rales.  Abdominal:     General: There is no distension.     Palpations: Abdomen is soft.     Tenderness: There is no abdominal tenderness.  Musculoskeletal:        General: No  tenderness.     Cervical back: Normal range of motion and neck supple.  Skin:    General: Skin is warm and dry.     Findings: No erythema or rash.  Neurological:     Mental Status: He is alert and oriented to person, place, and time.     ED Results and Treatments Labs (all labs ordered are listed, but only abnormal results are displayed) Labs Reviewed  RESP PANEL BY RT-PCR (RSV, FLU A&B, COVID)  [id number] - Abnormal; Notable for the following components:      Result Value   SARS Coronavirus 2 by RT PCR POSITIVE (*)    All other components within normal limits                                                                                                                         EKG  EKG Interpretation Date/Time:    Ventricular Rate:    PR Interval:    QRS Duration:    QT Interval:    QTC Calculation:   R Axis:      Text Interpretation:         Radiology No results found.  Medications Ordered in ED Medications  ketorolac  (TORADOL ) 15 MG/ML injection 15 mg (15 mg Intravenous Given 07/08/24 0326)  prochlorperazine  (COMPAZINE ) injection 10 mg (10 mg Intravenous Given 07/08/24 0330)  diphenhydrAMINE  (BENADRYL ) injection 12.5 mg (12.5 mg Intravenous Given 07/08/24 0328)  sodium chloride  0.9 % bolus 500 mL (500 mLs Intravenous New Bag/Given 07/08/24 0336)   Procedures Procedures  (including critical care time) Medical Decision Making / ED Course   Medical Decision Making Risk Prescription drug management.    Patient  presents with viral symptoms for 1 day. Decreased oral hydration. Rest of history as above.  Patient appears well. No signs of toxicity, patient is interactive. No hypoxia, tachypnea or other signs of respiratory distress. No sign of clinical dehydration. Lung exam clear. Rest of exam as above.  Most consistent with viral illness.  COVID+  No evidence suggestive of pharyngitis, AOM, PNA, or meningitis.  Chest x-ray not indicated at this time.  Typical  headache for the patient. Non focal neuro exam.  No fever. Doubt meningitis.  Doubt IIH. No recent head trauma. Doubt intracranial bleed.  No indication for imaging.   Will treat with migraine cocktail and reevaluate.  Significant improvement with cocktail.  Discussed symptomatic treatment with the patient and they will follow closely with their PCP.      Final Clinical Impression(s) / ED Diagnoses Final diagnoses:  COVID-19 virus infection   The patient appears reasonably screened and/or stabilized for discharge and I doubt any other medical condition or other [facility] requiring further screening, evaluation, or treatment in the ED at this time. I have discussed the findings, Dx and Tx plan with the patient/family who expressed understanding and agree(s) with the plan. Discharge instructions discussed at length. The patient/family was given strict return precautions who verbalized understanding of the instructions. No further questions at time of discharge.  Disposition: Discharge  Condition: Good  ED Discharge Orders          Ordered    ondansetron  (ZOFRAN -ODT) 4 MG disintegrating tablet  Every 8 hours PRN        07/08/24 0413             Follow Up: Lorren Greig PARAS, NP [address] [place] [address] [phone]  Call  to schedule an appointment for close follow up    This chart was dictated using voice recognition software.  Despite best efforts to proofread,  errors can occur which can change the documentation meaning.    Trine Raynell Moder, MD 07/08/24 [phone]

## 2024-07-08 NOTE — Discharge Instructions (Addendum)
 You may take over-the-counter medicine for symptomatic relief, such as Tylenol, Motrin, TheraFlu, Alka seltzer , black elderberry, etc. Please limit acetaminophen (Tylenol) to 4000 mg and Ibuprofen (Motrin, Advil, etc.) to 2400 mg for a 24hr period. Please note that other over-the-counter medicine may contain acetaminophen or ibuprofen as a component of their ingredients.

## 2024-08-20 DIAGNOSIS — F32A Depression, unspecified: Secondary | ICD-10-CM | POA: Diagnosis not present

## 2024-08-20 DIAGNOSIS — F419 Anxiety disorder, unspecified: Secondary | ICD-10-CM | POA: Diagnosis not present

## 2024-08-20 DIAGNOSIS — F902 Attention-deficit hyperactivity disorder, combined type: Secondary | ICD-10-CM | POA: Diagnosis not present

## 2024-11-05 ENCOUNTER — Other Ambulatory Visit: Payer: Self-pay | Admitting: Physician Assistant
